# Patient Record
Sex: Male | Born: 1985 | Race: Black or African American | Hispanic: No | Marital: Single | Smoking: Current some day smoker
Health system: Southern US, Community
[De-identification: ages and names within clinical notes are randomized; demographics above are authoritative.]

## PROBLEM LIST (undated history)

## (undated) DIAGNOSIS — Z973 Presence of spectacles and contact lenses: Secondary | ICD-10-CM

## (undated) DIAGNOSIS — N189 Chronic kidney disease, unspecified: Secondary | ICD-10-CM

## (undated) DIAGNOSIS — J189 Pneumonia, unspecified organism: Secondary | ICD-10-CM

## (undated) DIAGNOSIS — F32A Depression, unspecified: Secondary | ICD-10-CM

## (undated) DIAGNOSIS — F419 Anxiety disorder, unspecified: Secondary | ICD-10-CM

## (undated) DIAGNOSIS — J449 Chronic obstructive pulmonary disease, unspecified: Secondary | ICD-10-CM

## (undated) DIAGNOSIS — S069XAA Unspecified intracranial injury with loss of consciousness status unknown, initial encounter: Secondary | ICD-10-CM

## (undated) DIAGNOSIS — I10 Essential (primary) hypertension: Secondary | ICD-10-CM

## (undated) DIAGNOSIS — G4730 Sleep apnea, unspecified: Secondary | ICD-10-CM

## (undated) DIAGNOSIS — S069X9A Unspecified intracranial injury with loss of consciousness of unspecified duration, initial encounter: Secondary | ICD-10-CM

## (undated) DIAGNOSIS — R519 Headache, unspecified: Secondary | ICD-10-CM

## (undated) DIAGNOSIS — F329 Major depressive disorder, single episode, unspecified: Secondary | ICD-10-CM

## (undated) HISTORY — PX: MYRINGOTOMY: SUR874

---
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
## 2005-02-11 ENCOUNTER — Emergency Department (HOSPITAL_COMMUNITY): Admission: EM | Admit: 2005-02-11 | Discharge: 2005-02-12 | Payer: Self-pay | Admitting: Emergency Medicine

## 2005-02-12 ENCOUNTER — Emergency Department (HOSPITAL_COMMUNITY): Admission: EM | Admit: 2005-02-12 | Discharge: 2005-02-12 | Payer: Self-pay | Admitting: *Deleted
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
## 2005-12-22 ENCOUNTER — Emergency Department (HOSPITAL_COMMUNITY): Admission: EM | Admit: 2005-12-22 | Discharge: 2005-12-22 | Payer: Self-pay | Admitting: Emergency Medicine

## 2006-11-16 ENCOUNTER — Emergency Department (HOSPITAL_COMMUNITY): Admission: EM | Admit: 2006-11-16 | Discharge: 2006-11-16 | Payer: Self-pay | Admitting: Emergency Medicine

## 2006-11-17 ENCOUNTER — Emergency Department (HOSPITAL_COMMUNITY): Admission: EM | Admit: 2006-11-17 | Discharge: 2006-11-17 | Payer: Self-pay | Admitting: Emergency Medicine

## 2008-07-29 ENCOUNTER — Inpatient Hospital Stay (HOSPITAL_COMMUNITY): Admission: AC | Admit: 2008-07-29 | Discharge: 2008-08-09 | Payer: Self-pay
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
## 2008-08-03 ENCOUNTER — Ambulatory Visit: Payer: Self-pay | Admitting: Physical Medicine & Rehabilitation
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
## 2008-08-09 ENCOUNTER — Ambulatory Visit: Payer: Self-pay | Admitting: Physical Medicine & Rehabilitation

## 2008-08-09 ENCOUNTER — Inpatient Hospital Stay (HOSPITAL_COMMUNITY)
Admission: RE | Admit: 2008-08-09 | Discharge: 2008-08-18 | Payer: Self-pay | Admitting: Physical Medicine & Rehabilitation
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
## 2008-08-21 ENCOUNTER — Encounter
Admission: RE | Admit: 2008-08-21 | Discharge: 2008-10-23 | Payer: Self-pay | Admitting: Physical Medicine & Rehabilitation

## 2008-08-30 ENCOUNTER — Ambulatory Visit: Payer: Self-pay | Admitting: Family Medicine

## 2008-09-18 ENCOUNTER — Encounter
Admission: RE | Admit: 2008-09-18 | Discharge: 2008-12-17 | Payer: Self-pay | Admitting: Physical Medicine & Rehabilitation

## 2008-09-19 ENCOUNTER — Ambulatory Visit: Payer: Self-pay | Admitting: Physical Medicine & Rehabilitation

## 2008-11-14 ENCOUNTER — Ambulatory Visit: Payer: Self-pay | Admitting: Physical Medicine & Rehabilitation

## 2009-05-08 ENCOUNTER — Emergency Department (HOSPITAL_COMMUNITY): Admission: EM | Admit: 2009-05-08 | Discharge: 2009-05-08 | Payer: Self-pay | Admitting: Emergency Medicine

## 2009-12-10 ENCOUNTER — Emergency Department (HOSPITAL_COMMUNITY): Admission: EM | Admit: 2009-12-10 | Discharge: 2009-12-11 | Payer: Self-pay | Admitting: Emergency Medicine

## 2010-01-19 ENCOUNTER — Emergency Department (HOSPITAL_COMMUNITY): Admission: EM | Admit: 2010-01-19 | Discharge: 2010-01-19 | Payer: Self-pay | Admitting: Emergency Medicine
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
## 2010-01-25 ENCOUNTER — Emergency Department (HOSPITAL_COMMUNITY): Admission: EM | Admit: 2010-01-25 | Discharge: 2010-01-25 | Payer: Self-pay | Admitting: Emergency Medicine
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
## 2010-07-05 ENCOUNTER — Emergency Department (HOSPITAL_COMMUNITY): Admission: EM | Admit: 2010-07-05 | Discharge: 2010-07-06 | Payer: Self-pay | Admitting: Emergency Medicine
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
## 2010-11-06 ENCOUNTER — Emergency Department (HOSPITAL_COMMUNITY): Admission: EM | Admit: 2010-11-06 | Discharge: 2010-11-06 | Payer: Self-pay | Admitting: Emergency Medicine

## 2010-12-16 ENCOUNTER — Emergency Department (HOSPITAL_COMMUNITY)
Admission: EM | Admit: 2010-12-16 | Discharge: 2010-12-16 | Payer: Self-pay | Source: Home / Self Care | Admitting: Emergency Medicine
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
## 2010-12-24 ENCOUNTER — Emergency Department (HOSPITAL_COMMUNITY)
Admission: EM | Admit: 2010-12-24 | Discharge: 2010-12-24 | Payer: Self-pay | Source: Home / Self Care | Admitting: Emergency Medicine

## 2011-03-15 LAB — CBC
MCH: 31.7 pg (ref 26.0–34.0)
MCHC: 35 g/dL (ref 30.0–36.0)
MCV: 90.6 fL (ref 78.0–100.0)
Platelets: 172 10*3/uL (ref 150–400)
RDW: 11.6 % (ref 11.5–15.5)
WBC: 5.7 10*3/uL (ref 4.0–10.5)

## 2011-03-15 LAB — DIFFERENTIAL
Basophils Absolute: 0.1 10*3/uL (ref 0.0–0.1)
Basophils Relative: 1 % (ref 0–1)
Eosinophils Absolute: 0.1 10*3/uL (ref 0.0–0.7)
Eosinophils Relative: 2 % (ref 0–5)
Monocytes Absolute: 0.3 10*3/uL (ref 0.1–1.0)

## 2011-03-15 LAB — BASIC METABOLIC PANEL
BUN: 8 mg/dL (ref 6–23)
CO2: 28 mEq/L (ref 19–32)
Chloride: 103 mEq/L (ref 96–112)
Creatinine, Ser: 1.17 mg/dL (ref 0.4–1.5)
Glucose, Bld: 98 mg/dL (ref 70–99)

## 2011-03-16 LAB — URINALYSIS, ROUTINE W REFLEX MICROSCOPIC
Bilirubin Urine: NEGATIVE
Glucose, UA: NEGATIVE mg/dL
Glucose, UA: NEGATIVE mg/dL
Hgb urine dipstick: NEGATIVE
Hgb urine dipstick: NEGATIVE
Ketones, ur: NEGATIVE mg/dL
Protein, ur: NEGATIVE mg/dL
Protein, ur: NEGATIVE mg/dL
Urobilinogen, UA: 1 mg/dL (ref 0.0–1.0)
pH: 6.5 (ref 5.0–8.0)

## 2011-03-16 LAB — GC/CHLAMYDIA PROBE AMP, GENITAL: GC Probe Amp, Genital: NEGATIVE

## 2011-04-07 LAB — RAPID STREP SCREEN (MED CTR MEBANE ONLY): Streptococcus, Group A Screen (Direct): NEGATIVE
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
## 2011-05-12 NOTE — Assessment & Plan Note (Signed)
Douglas [name] is back regarding his chronic brain injury.  He has been home with  his parents and doing quite well.  He is independent.  He goes out with  his friends and socializes.  He is interested in getting back to work.  His eyes still have problem, but he notes some improvement there.  He  has occasional shoulder pain, but this has dramatically improved.  He  does chores.  He is responsible according to his parents.  He calls them  and let them know where he is.  He has not been involved in any  questionable activity either.  He sleeps fairly well, but still on a  late night schedule.  He had been working third shift as a Doctor, general practice prior  to the injury.   REVIEW OF SYSTEMS:  Notable for the above.  Full review is in the  written health and history section of the chart.   SOCIAL HISTORY:  Noted above.  Parents are with him today.   PHYSICAL EXAMINATION:  VITAL SIGNS:  Blood pressure is 142/64, pulse 63,  respiratory rate 18, and he is sating 96% on room air.  GENERAL:  The patient is pleasant, alert and oriented x3.  His cognition  is good.  He has good insight awareness with tension.  Weight is stable.  He has excellent dynamic and standing balance.  He does have ptosis on  the right eye and difficulty with medial and superior deviation of the  eye.  He has good visual acuity otherwise.  Right pupil remains dilated.  Decreased pinprick and light touch also, still slightly on the right  side.  HEART:  Regular.  CHEST:  Clear.  ABDOMEN:  Soft and nontender.   ASSESSMENT:  1. Traumatic brain injury/multi-trauma.  2. Cranial nerve III injury.  3. History of asthma.   PLAN:  1. The patient is off all medications including his ramipril.  Blood      pressure is good today, so I recommended regular follow up of his      blood pressure due to his family history, but no medication at this      point.  2. Allowed the patient to return to work part-time.  He can work up to      20 hours a  week.  3. We will make referral to neuro-ophthalmology regarding his right      eye as his Medicaid approval allows.  4. I will see him back on an as needed basis.      Meredith Staggers, M.D.  Electronically Signed     ZTS/MedQ  D:  11/14/2008 10:24:59  T:  11/14/2008 [id number]  Job #:  [id number]

## 2011-05-12 NOTE — Assessment & Plan Note (Signed)
Douglas [name] is here in followup today September 19, 2008, for his traumatic  brain injury.  He has been at home with his family who had been  providing 24-hour supervision.  He is receiving outpatient therapy.  He  is doing quite well.  He denies pain currently.  His mood has been  excellent.  He has some problems with attention and short-term memory  for more trivial things.  He has had some improvement in his eye opening  and movement noting better vision to the left over the last few weeks.  He is still receiving PT, OT, and speech at [facility].  He ultimately will like to return to work but note he is  limited due to his neurological changes.   The patient reports numbness on the right side of the face, hand and  leg.  Mom notes occasional problems with temperature determination and  has been in a risk for burning himself a few times especially early  after discharge.   REVIEW OF SYSTEMS:  Notable for constipation occasionally, has had some  weakness.  He has a history of sleep apnea but seems to be doing right  there.  Breathing has been stable despite his chronic asthma.  Full  review is in the written health and history section in the chart.   SOCIAL HISTORY:  The patient is living with family as noted above.  He  was working as a Doctor, general practice prior to injury.   PHYSICAL EXAMINATION:  VITAL SIGNS:  Blood pressure 153/91, pulse is 51,  respiratory rate 18, and he is saturating 98% on room air.  GENERAL:  The patient is a pleasant, alert, and oriented x3.  He is able  to remember 3 words after 5 minutes.  He is able to follow simple one-  step commands.  Attention was fairly good.  Able to perform mathematic  equations in his head without significant delay.  Affect was  appropriate.  Weight seemed to be stable.  The patient ambulates  normally without any difficulties until we tried heel to toe gait which  caused some loss of balance to the right.  Face is notable for  ongoing  ptosis to the right eye with inability to medially move the eye.  He  also has dilation of the right pupil.  Face has some decrease in  pinprick and light touch as though the arm and leg today.  Strength is  showing generally 5/5 in the right arm and leg today.  HEART:  Regular.  CHEST:  Clear.  ABDOMEN:  Soft and nontender.   ASSESSMENT:  1. Traumatic brain injury including right femoral contusion.  2. Cranial nerve III injury.  3. History of asthma.   PLAN:  1. I would stop Tegretol as mood seems to be well controlled.  He is      only taking 100 mg b.i.d. currently.  2. Continue Ritalin 10 mg twice a day for attention and focus.  This      seems to benefit him.  3. We will hold off on neurophthalmology evaluation at this point      until his Medicaid comes through.  I don't think there is much to      do acutely regarding his vision at this time as it is due to his      traumatic brain injury and still stands to improve over the next      several weeks.  4. Outpatient therapy is to  work on higher level cognitive and      attention tasks as well as balance.  I do think that Miquan can be      alone by himself during the day although he is still living with      family.  We will start to leave      him alone for a few hours and observe for safety issues.  5. I will see him back in about 2 months' time.      Meredith Staggers, M.D.  Electronically Signed     ZTS/MedQ  D:  09/19/2008 11:51:00  T:  09/20/2008 00:39:34  Job #:  [id number]
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
## 2011-05-15 NOTE — Discharge Summary (Signed)
[place]  Patient:    Douglas [name], Douglas [name] Visit Number: [id number] MRN: [id number]          Service Type: PED Location: PEDS [phone] Attending Physician:  Baird Lyons Driver Dictated by:   Kasandra Knudsen. Annamaria Boots, M.D. Admit Date:  11/08/2001 Discharge Date: 11/11/2001   CC:         Bobby Rumpf D. Alroy Dust, M.D.  Parks Ranger, M.D  Sharon Mt, M.D., ENT   Discharge Summary  FINAL DIAGNOSES: 1. Asthmatic bronchitis with acute exacerbation. 2. Acute and chronic sinusitis. 3. Allergic rhinitis. 4. History of past noncompliance. 5. Depression. 6. Attention deficit disorder. 7. Hypertension, essential. 8. Obstructive sleep apnea.  BRIEF HISTORY:  This is a 25 year old African American male with chronic rhinosinusitis and asthmatic bronchitis, hypertension, and obstructive sleep apnea as well as a history of behavioral problems including attention deficit disorder, depression and medication noncompliance with missed school.  He had recently been fitted with CPAP at 12 cm of water pressure for obstructive sleep apnea but was not using it because of nasal congestion and he had presented the office markedly congested in the head and chest, not improving with initiation of Augmentin, prednisone taper and nebulizer treatments.  He came in through the emergency room complaining of wheezing.  He was noted to have marked rhonchi and was admitted.  Family said that outpatient medicines tended to be scattered randomly around the room, he would take only a few from each new prescription and they were sure that was why he was not responding to prescribed treatments.  He had been previously hospitalized at [facility] in September but had not followed up.  PHYSICAL EXAMINATION:  There was coarse rhonchi without accessory muscle use, retraction or stridor.  See dictated history and physical for details.  HOSPITAL COURSE:  He was begun on  Augmentin, intravenous Solu-Medrol, and nebulized bronchodilators.  His outpatient antidepressant and hypertensive therapy was continued.  As he improved, he was converted to Advair Diskus and oral prednisone.  His mother stayed with him during this admission and they were met by case management for social service discussion and they also had nutrition counseling for weight control.  He was discharged with condition much improved, no wheeze, no apparent nasal congestion and no cough, demonstrating response to therapy.  LABORATORY:  Pulmonary function tests done November 09, 2001, showed normal spirometry with forced vital capacity 4.08 liters (90%), FEV1 3.70 liters 96% of predicted and little response to bronchodilator.  Measured lung volumes were within normal without hyperinflation or air trapping.  Sputum culture showed mixed flora on Gram stain and reported normal oropharyngeal flora.  Oxygen saturations were recorded 95% on room air. Chemistry and CBC are not included in this chart.  DISCHARGE PLANS:  Diet:  Low fat, low sugar for weight loss.  CPAP 12 cm of water pressure all night every night.  Activity as tolerated.  DISCHARGE MEDICATIONS:  1. Amoxicillin 250 mg t.i.d. x 7 days to finish supply he has at home.  2. Prednisone using 5 mg tablets to taper over eight days from 40 mg daily     until finished.  3. Advair Diskus 250/50 one b.i.d.  4. Home nebulizer with Xopenex 0.63% t.i.d. p.r.n. or use Maxair Autohaler     two puffs q.i.d. p.r.n.  5. Guaifenesin LA 1200 mg b.i.d.  6. Rhinocort Aqua one spray each nostril q.d.  7. Ear drops (neomycin/polymyxin/hydrocortisone) as per Sharon Mt, M.D.  8. Singulair  10 mg q.d.  9. Effexor 75 mg XR two q.d. 10. Prevacid 30 mg q.d.  11. Norvasc 5 mg q.d.  FOLLOW-UP:  Office follow-up with me in three to four weeks.  Office follow-up with Parks Ranger, M.D, Argentina Donovan. Alroy Dust, M.D., and Dr. Prescott Parma as directed. Dictated by:    Kasandra Knudsen. Annamaria Boots, M.D. Attending Physician:  Baird Lyons Driver DD:  [id number] TD:  12/01/01 Job: [id number]
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
## 2011-05-15 NOTE — H&P (Signed)
[place] [employer]  Patient:    Douglas [name], Douglas [name] Visit Number: [id number] MRN: [id number]          Service Type: PED Location: PEDS [phone] Attending Physician:  Baird Lyons Driver Dictated by:   Kasandra Knudsen. Annamaria Boots, M.D. Admit Date:  11/08/2001   CC:         Argentina Donovan. Alroy Dust, M.D.  Parks Ranger, M.D   History and Physical  PROBLEMS: 1. 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 2. Medication noncompliance. 3. Hypertension. 4. Obstructive sleep apnea with excessive daytime somnolence.  HISTORY OF PRESENT ILLNESS:  A 25 year old African-American male with a long history of rhinosinusitis and asthmatic bronchitis, who has been well worked up by Parks Ranger, M.D, for allergy and respiratory problems.  He was sent to me earlier this year for evaluation and management of obstruction sleep apnea marked by daytime somnolence, irregular sleep schedule, snoring, and witnessed apnea.  He was fitted with CPAP at 12 cm of water pressure, which was effective, but when he came to see me in the office yesterday, he reported he had not been using it because of nasal congestion.  When seen in the office, he had characteristic marked nasal congestion and loosely congested rattling cough.  So that he could improve enough to tolerate CPAP, I gave an albuterol nebulizer treatment, Depo-Medrol 80 mg IM, Augmentin 875 mg b.i.d., and prednisone to taper from 40 mg.  This morning after being up at home, he says that his throat began closing off, but without sore throat, hives, angioedema, or itching.  He says that he panicked and "that makes me wheeze." He showed little improvement with albuterol nebulizer treatment in the emergency room and I was called by the emergency room for admission.  The stepfather had told me yesterday in the office that this patient has a long history of noncompliance with medications, will start a new bottle,  and then will scatter the pills loosely on the floor on his room, seems unable or unwilling to clean his room, to take medications on schedule, and to follow medical direction and "thats why he doesnt get better."  The patient has basically admitted the same to me.  REVIEW OF SYSTEMS:  No itching or throat tightness now.  Pharyngeal nasal congestion with no headache and no sore throat.  Often sleepy.  He will fall asleep late in the evening and then will sleep late in the morning, missing a lot of school.  He does snore.  Daily cough, rattle, and sputum, which is usually white or yellow.  Breathing does not change a lot from one day to the next, but is worse in humid weather and around respiratory irritants. Occasional reflux with no dysphagia or choking.  No palpitations, pleuritic or anginal pattern chest pain, water brash, or blood.  No nausea or vomiting. Occasional constipation.  He describes recently passing some medications per rectum.  No calf pain or edema.  PAST MEDICAL HISTORY:  Previous CT scan of sinuses showed mucosal thickening. He has had a detailed work-up for allergy and that information will be brought over from the office chart, but he is not on allergy vaccine.  Multiple emergency room and hospital visits for similar complaints.  His mother says, "He just gets patched up and bounces right back."  The last hospitalization for respiratory problems was a [place] in March.  He was hospitalized at the [facility] in September with uncertain  diagnosis.  He denies a history of heart, diabetes, renal, liver, pancreas, or thyroid problems, syncope, or seizures.  Treated for hypertension.  Surgery for adenoids and myringotomy tubes.  Recent outpatient treatment for otitis with eardrops.  SOCIAL HISTORY:  No tobacco.  No drugs.  Lives with mother, stepfather, and four sisters.  Nobody smokes.  Frequent missed school.  FAMILY HISTORY:  His  mother has has "chemical imbalance."  His fathers health status is unknown.  An uncle has asthma.  PHYSICAL EXAMINATION:  Temperature 98.7 degrees, pulse 119 and regular, BP 165/79, room air saturation 95%.  GENERAL APPEARANCE:  A pleasant, alert, oriented, very tall, moderately obese, young man.  Speech is clear.  SKIN:  No rash, ulcers, or bruises.  ADENOPATHY:  None.  HEENT:  Atraumatic.  Marked nasal congestion.  Watery, sniffing, and snorting. The tongue is not coated.  The mucus is white.  NECK:  No neck vein distention, stridor, or thyromegaly.  CHEST:  Heart sounds regular.  No murmur or gallop.  Normal S1 and S2.  The PMI is a little left for sternum.  LUNGS:  Coarse, loose rhonchi bilaterally.  Rattling and congested, but surprisingly a nonproductive cough.  Breathing is unlabored.  No retraction or accessory muscle use.  No stridor.  ABDOMEN:  Soft and nontender without hepatosplenomegaly.  Bowel sounds are present.  I feel no masses.  GENITALIA:  Not done at this time.  No complaint.  RECTAL:  Not done at this time.  No complaint.  EXTREMITIES:  No clubbing, cyanosis, or edema.  He jiggles his legs a lot, but no vesiculation.  Coordination appears normal.  CHEST X-RAY:  A film has been done, but I cannot find it in the department tonight.  IMPRESSION: 1. Acute exacerbation of chronic rhinosinusitis and asthma.  There is a    significant pattern of congested cough and nasal discharge.  Noncompliance    with therapy is suggested as the underlying problem.  To my knowledge, he    has not been found to have any congenital abnormality or any particularly    impressive environment exposure.  I do not get a family history suggesting    cystic fibrosis, immotile-cilia syndrome, or specific immune deficiency.    He may reflux a little, but it does not seem to be a large scale problem.     Most of this has been worked up and I will check for records. 2. The  behavioral issue has been previously addressed.  I will check with his    primary physician for available information, but his ability and    willingness to follow treatment plans will make a huge difference in our    success. 3. Hypertension.  This should respond to his outpatient therapy, but we may    need to add a diuretic, particularly why he is on steroids. 4. Obstructive sleep apnea.  This should respond to CPAP when we can get his    nose cleared up enough to tolerate it.  This and some counseling would help    a more regular sleep pattern, which should improve his ability to go to    school. Dictated by:   Kasandra Knudsen. Annamaria Boots, M.D. Attending Physician:  Baird Lyons Driver DD:  [id number] TD:  11/08/01 Job: [id number]

## 2011-09-25 LAB — BLOOD GAS, ARTERIAL
Bicarbonate: 27.2 — ABNORMAL HIGH
Bicarbonate: 28 — ABNORMAL HIGH
FIO2: 0.3
MECHVT: 650
O2 Saturation: 75.3
O2 Saturation: 97.9
PEEP: 5
PEEP: 5
Patient temperature: 101.7
Patient temperature: 98.6
RATE: 12
RATE: 12
pH, Arterial: 7.375
pH, Arterial: 7.379

## 2011-09-25 LAB — EXPECTORATED SPUTUM ASSESSMENT W GRAM STAIN, RFLX TO RESP C

## 2011-09-25 LAB — TYPE AND SCREEN: Antibody Screen: NEGATIVE

## 2011-09-25 LAB — POCT I-STAT, CHEM 8
HCT: 43
Hemoglobin: 14.6
Potassium: 2.5 — CL
Sodium: 143
TCO2: 21

## 2011-09-25 LAB — CBC
HCT: 31.6 — ABNORMAL LOW
HCT: 35.1 — ABNORMAL LOW
HCT: 35.9 — ABNORMAL LOW
HCT: 39
Hemoglobin: 10.5 — ABNORMAL LOW
Hemoglobin: 12.5 — ABNORMAL LOW
Hemoglobin: 12.9 — ABNORMAL LOW
Hemoglobin: 8.2 — ABNORMAL LOW
MCHC: 31.1
MCHC: 32.9
MCHC: 33
MCHC: 33.2
MCV: 90.3
MCV: 91.2
MCV: 91.5
MCV: 92
Platelets: 140 — ABNORMAL LOW
Platelets: 191
Platelets: 226
Platelets: 228
Platelets: 280
Platelets: 399
RBC: 2.74 — ABNORMAL LOW
RBC: 3.46 — ABNORMAL LOW
RBC: 4.09 — ABNORMAL LOW
RBC: 4.18 — ABNORMAL LOW
RBC: 4.21 — ABNORMAL LOW
RBC: 4.41
RDW: 12.3
RDW: 12.5
RDW: 12.6
RDW: 12.7
RDW: 12.7
RDW: 12.7
RDW: 12.9
RDW: 13.3
WBC: 11.5 — ABNORMAL HIGH
WBC: 6.5
WBC: 7.6
WBC: 7.8
WBC: 9.2

## 2011-09-25 LAB — URINALYSIS, ROUTINE W REFLEX MICROSCOPIC
Ketones, ur: NEGATIVE
Leukocytes, UA: NEGATIVE
Nitrite: NEGATIVE
Specific Gravity, Urine: 1.02
pH: 5.5

## 2011-09-25 LAB — COMPREHENSIVE METABOLIC PANEL
Albumin: 4
BUN: 12
Calcium: 9.8
Glucose, Bld: 99
Sodium: 140
Total Protein: 8

## 2011-09-25 LAB — URINE MICROSCOPIC-ADD ON

## 2011-09-25 LAB — BASIC METABOLIC PANEL
BUN: 10
BUN: 10
BUN: 3 — ABNORMAL LOW
BUN: 4 — ABNORMAL LOW
CO2: 25
Calcium: 8.3 — ABNORMAL LOW
Calcium: 8.4
Calcium: 9
Calcium: 9.5
Chloride: 105
Creatinine, Ser: 0.82
Creatinine, Ser: 0.84
Creatinine, Ser: 0.95
Creatinine, Ser: 1.03
GFR calc Af Amer: >60
GFR calc Af Amer: >60
GFR calc Af Amer: >60
GFR calc non Af Amer: >60
GFR calc non Af Amer: >60
GFR calc non Af Amer: >60
GFR calc non Af Amer: >60
GFR calc non Af Amer: >60
GFR calc non Af Amer: >60
Glucose, Bld: 104 — ABNORMAL HIGH
Glucose, Bld: 91
Glucose, Bld: 98
Glucose, Bld: 99
Potassium: 3.8
Potassium: 3.8
Potassium: 3.9
Potassium: 4.1
Sodium: 134 — ABNORMAL LOW
Sodium: 136
Sodium: 136

## 2011-09-25 LAB — RAPID URINE DRUG SCREEN, HOSP PERFORMED
Amphetamines: NOT DETECTED
Tetrahydrocannabinol: POSITIVE — AB

## 2011-09-25 LAB — DIFFERENTIAL
Basophils Absolute: 0.1
Eosinophils Absolute: 0.3
Eosinophils Absolute: 0.5
Eosinophils Relative: 4
Lymphocytes Relative: 21
Lymphocytes Relative: 40
Lymphs Abs: 2
Lymphs Abs: 2.4
Lymphs Abs: 5.2 — ABNORMAL HIGH
Monocytes Absolute: 1
Monocytes Absolute: 1.3 — ABNORMAL HIGH
Monocytes Relative: 11
Monocytes Relative: 9
Neutro Abs: 4.7
Neutro Abs: 6.3
Neutro Abs: 7.2
Neutrophils Relative %: 60
Neutrophils Relative %: 63
nRBC: 0

## 2011-09-25 LAB — URINE CULTURE: Colony Count: NO GROWTH

## 2011-09-25 LAB — ABO/RH: ABO/RH(D): A POS

## 2011-09-25 LAB — CULTURE, RESPIRATORY W GRAM STAIN

## 2012-03-15 ENCOUNTER — Encounter (HOSPITAL_BASED_OUTPATIENT_CLINIC_OR_DEPARTMENT_OTHER): Payer: Self-pay | Admitting: *Deleted

## 2012-03-15 ENCOUNTER — Emergency Department (HOSPITAL_BASED_OUTPATIENT_CLINIC_OR_DEPARTMENT_OTHER)
Admission: EM | Admit: 2012-03-15 | Discharge: 2012-03-15 | Disposition: A | Discharge status: Home / Self Care | Payer: BC Managed Care – PPO | Attending: Emergency Medicine | Admitting: Emergency Medicine

## 2012-03-15 DIAGNOSIS — K029 Dental caries, unspecified: Secondary | ICD-10-CM | POA: Insufficient documentation

## 2012-03-15 DIAGNOSIS — I10 Essential (primary) hypertension: Secondary | ICD-10-CM | POA: Insufficient documentation

## 2012-03-15 DIAGNOSIS — R062 Wheezing: Secondary | ICD-10-CM

## 2012-03-15 DIAGNOSIS — F17200 Nicotine dependence, unspecified, uncomplicated: Secondary | ICD-10-CM | POA: Insufficient documentation

## 2012-03-15 DIAGNOSIS — G4730 Sleep apnea, unspecified: Secondary | ICD-10-CM | POA: Insufficient documentation

## 2012-03-15 DIAGNOSIS — K089 Disorder of teeth and supporting structures, unspecified: Secondary | ICD-10-CM | POA: Insufficient documentation

## 2012-03-15 DIAGNOSIS — J449 Chronic obstructive pulmonary disease, unspecified: Secondary | ICD-10-CM | POA: Insufficient documentation

## 2012-03-15 DIAGNOSIS — K0889 Other specified disorders of teeth and supporting structures: Secondary | ICD-10-CM

## 2012-03-15 DIAGNOSIS — J4489 Other specified chronic obstructive pulmonary disease: Secondary | ICD-10-CM | POA: Insufficient documentation

## 2012-03-15 HISTORY — DX: Sleep apnea, unspecified: G47.30

## 2012-03-15 HISTORY — DX: Chronic obstructive pulmonary disease, unspecified: J44.9

## 2012-03-15 HISTORY — DX: Essential (primary) hypertension: I10

## 2012-03-15 MED ORDER — OXYCODONE-ACETAMINOPHEN 5-325 MG PO TABS
2.0000 | ORAL_TABLET | Freq: Once | ORAL | Status: AC
  Administered 2012-03-15: 2 via ORAL
  Filled 2012-03-15: qty 2

## 2012-03-15 MED ORDER — OXYCODONE-ACETAMINOPHEN 5-325 MG PO TABS: 1.0000 | ORAL_TABLET | Freq: Four times a day (QID) | ORAL | Status: AC | PRN

## 2012-03-15 MED ORDER — ALBUTEROL SULFATE HFA 108 (90 BASE) MCG/ACT IN AERS: 1.0000 | INHALATION_SPRAY | Freq: Four times a day (QID) | RESPIRATORY_TRACT | Status: DC | PRN

## 2012-03-15 MED ORDER — ALBUTEROL SULFATE HFA 108 (90 BASE) MCG/ACT IN AERS
2.0000 | INHALATION_SPRAY | Freq: Once | RESPIRATORY_TRACT | Status: AC
  Administered 2012-03-15: 2 via RESPIRATORY_TRACT
  Filled 2012-03-15: qty 6.7

## 2012-03-15 MED ORDER — PENICILLIN V POTASSIUM 500 MG PO TABS: 500.0000 mg | ORAL_TABLET | Freq: Four times a day (QID) | ORAL | Status: AC

## 2012-03-15 NOTE — Discharge Instructions (Signed)
Dental Caries  Tooth decay (dental caries, cavities) is the most common of all oral diseases. It occurs in all ages but is more common in children and young adults.  CAUSES  Bacteria in your mouth combine with foods (particularly sugars and starches) to produce plaque. Plaque is a substance that sticks to the hard surfaces of teeth. The bacteria in the plaque produce acids that attack the enamel of teeth. Repeated acid attacks dissolve the enamel and create holes in the teeth. Root surfaces of teeth may also get these holes.  Other contributing factors include:   Frequent snacking and drinking of cavity-producing foods and liquids.   Poor oral hygiene.   Dry mouth.   Substance abuse such as methamphetamine.   Broken or poor fitting dental restorations.   Eating disorders.   Gastroesophageal reflux disease (GERD).   Certain radiation treatments to the head and neck.  SYMPTOMS  At first, dental decay appears as white, chalky areas on the enamel. In this early stage, symptoms are seldom present. As the decay progresses, pits and holes may appear on the enamel surfaces. Progression of the decay will lead to softening of the hard layers of the tooth. At this point you may experience some pain or achy feeling after sweet, hot, or cold foods or drinks are consumed. If left untreated, the decay will reach the internal structures of the tooth and produce severe pain. Extensive dental treatment, such as root canal therapy, may be needed to save the tooth at this late stage of decay development.  DIAGNOSIS  Most cavities will be detected during regular check-ups. A thorough medical and dental history will be taken by the dentist. The dentist will use instruments to check the surfaces of your teeth for any breakdown or discoloration. Some dentists have special instruments, such as lasers, that detect tooth decay. Dental X-rays may also show some cavities that are not visible to the eye (such as between  the contact areas of the teeth). TREATMENT  Treatment involves removal of the tooth decay and replacement with a restorative material such as silver, gold, or composite (white) material. However, if the decay involves a large area of the tooth and there is little remaining healthy tooth structure, a cap (crown) will be fitted over the remaining structure. If the decay involves the center part of the tooth (pulp), root canal treatment will be needed before any type of dental restoration is placed. If the tooth is severely destroyed by the decay process, leaving the remaining tooth structures unrestorable, the tooth will need to be pulled (extracted). Some early tooth decay may be reversed by fluoride treatments and thorough brushing and flossing at home. PREVENTION   Eat healthy foods. Restrict the amount of sugary, starchy foods and liquids you consume. Avoid frequent snacking and drinking of unhealthy foods and liquids.   Sealants can help with prevention of cavities. Sealants are composite resins applied onto the biting surfaces of teeth at risk for decay. They smooth out the pits and grooves and prevent food from being trapped in them. This is done in early childhood before tooth decay has started.   Fluoride tablets may also be prescribed to children between 6 months and 10 years of age if your drinking water is not fluoridated. The fluoride absorbed by the tooth enamel makes teeth less susceptible to decay. Thorough daily cleaning with a toothbrush and dental floss is the best way to prevent cavities. Use of a fluoride toothpaste is highly recommended. Fluoride mouth rinses   may be used in specific cases.   Topical application of fluoride by your dentist is important in children.   Regular visits with a dentist for checkups and cleanings are also important.  SEEK IMMEDIATE DENTAL CARE IF:  You have a fever.   You develop redness and swelling of your face, jaw, or neck.   You develop swelling  around a tooth.   You are unable to open your mouth or cannot swallow.   You have severe pain uncontrolled by pain medicine.  Document Released: 09/05/2002 Document Revised: 12/03/2011 Document Reviewed: 05/21/2011 [facility] Patient Information 2012 [place], [place]. Dental Pain A tooth ache may be caused by cavities (tooth decay). Cavities expose the nerve of the tooth to air and hot or cold temperatures. It may come from an infection or abscess (also called a boil or furuncle) around your tooth. It is also often caused by dental caries (tooth decay). This causes the pain you are having. DIAGNOSIS  Your caregiver can diagnose this problem by exam. TREATMENT   If caused by an infection, it may be treated with medications which kill germs (antibiotics) and pain medications as prescribed by your caregiver. Take medications as directed.   Only take over-the-counter or prescription medicines for pain, discomfort, or fever as directed by your caregiver.   Whether the tooth ache today is caused by infection or dental disease, you should see your dentist as soon as possible for further care.  SEEK MEDICAL CARE IF: The exam and treatment you received today has been provided on an emergency basis only. This is not a substitute for complete medical or dental care. If your problem worsens or new problems (symptoms) appear, and you are unable to meet with your dentist, call or return to this location. SEEK IMMEDIATE MEDICAL CARE IF:   You have a fever.   You develop redness and swelling of your face, jaw, or neck.   You are unable to open your mouth.   You have severe pain uncontrolled by pain medicine.  MAKE SURE YOU:   Understand these instructions.   Will watch your condition.   Will get help right away if you are not doing well or get worse.  Document Released: 12/14/2005 Document Revised: 12/03/2011 Document Reviewed: 08/01/2008 [facility] Patient Information 2012 [place].  RESOURCE GUIDE  Dental Problems  Patients with Medicaid: [place] [facility] Phone:  [phone]                                                   Phone:  [phone]  If unable to pay or uninsured, contact:  [facility] or [facility]. to become qualified for the adult dental clinic.  Chronic Pain Problems Contact [facility]  [phone] Patients need to be referred by their primary care doctor.  Insufficient Money for Medicine Contact [employer]:  call "211" or [place] [phone].  No Primary  Care Doctor Call [place]  [phone] Other agencies that provide inexpensive medical care    [place]  [phone]    [facility]  [place]  [phone]    [facility]  [phone]    [facility]  [place]  [place]  [phone] [place]   [phone] (emergency services [phone])  Abuse/Neglect [place] [phone] [place] [phone] (After Hours)  Emergency [place] [phone]  [place] at the [place] [phone] [place] [phone]  MRSA Hotline #:   [phone]    [facility] Dept. [address]         [place] Phone:  [phone]                                  Phone:  [phone]                   Phone:   [place] Phone:  [place] [phone] (After Hours)  Bronchospasm A bronchospasm is when the tubes that carry air in and out of your lungs (bronchioles) become smaller. It is hard to breathe when this happens. A bronchospasm can be caused by:  Asthma.   Allergies.   Lung infection.  HOME CARE   Do not  smoke. Avoid places that have secondhand smoke.   Dust your house often. Have your air ducts cleaned once or twice a year.   Find out what allergies may cause your bronchospasms.   Use your inhaler properly if you have one. Know when to use it.   Eat healthy foods and drink plenty of water.   Only take medicine as told by your doctor.  GET HELP RIGHT AWAY IF:  You feel you cannot breathe or catch your breath.   You cannot stop coughing.   Your treatment is not helping you breathe better.  MAKE SURE YOU:   Understand these instructions.   Will watch your condition.   Will get help right away if you are not doing well or get worse.  Document Released: 10/11/2009 Document Revised: 12/03/2011 Document Reviewed: 10/11/2009 [facility] Patient Information 2012 [place].

## 2012-03-15 NOTE — ED Notes (Signed)
C/O pain in his mouth/teeth- upper & lower on both sides. Reports the pain is worse in the evenings and night/unable to eat because of the pain

## 2012-03-15 NOTE — ED Provider Notes (Signed)
History     CSN: [id number]  Arrival date & time 03/15/12  Curly Rim   First MD Initiated Contact with Patient 03/15/12 2008      Chief Complaint  Patient presents with  . Dental Pain    (Consider location/radiation/quality/duration/timing/severity/associated sxs/prior treatment) HPI  25yoM h/o multiple dental caries pw dental pain. Intermittent x few weeks, worsening yesterday. +Sunj fever +chills. BL upper and lower pain. No swelling of gums. Denies neck/throat swelling. Denies difficulty speaking. Took tylenol yesterday without relief. Denies cp/sob. Chronic cough at baseline.  ED Notes, ED Provider Notes from 03/15/12 0000 to 03/15/12 19:31:34       Claiborne Billings Fulton Reek, RN 03/15/2012 19:29      C/O pain in his mouth/teeth- upper & lower on both sides. Reports the pain is worse in the evenings and night/unable to eat because of the pain    Past Medical History  Diagnosis Date  . COPD (chronic obstructive pulmonary disease)   . Asthma   . Sleep apnea   . Hypertension     Past Surgical History  Procedure Date  . Myringotomy     No family history on file.  History  Substance Use Topics  . Smoking status: Current Everyday Smoker  . Smokeless tobacco: Not on file  . Alcohol Use: Yes      Review of Systems  All other systems reviewed and are negative.   except as noted HPI   Allergies  Review of patient's allergies indicates no known allergies.  Home Medications   Current Outpatient Rx  Name Route Sig Dispense Refill  . ALBUTEROL SULFATE HFA 108 (90 BASE) MCG/ACT IN AERS Inhalation Inhale 1-2 puffs into the lungs every 6 (six) hours as needed for wheezing. 1 Inhaler 10  . OXYCODONE-ACETAMINOPHEN 5-325 MG PO TABS Oral Take 1 tablet by mouth every 6 (six) hours as needed for pain. 20 tablet 0  . PENICILLIN V POTASSIUM 500 MG PO TABS Oral Take 1 tablet (500 mg total) by mouth 4 (four) times daily. 40 tablet 0    BP 127/86  Pulse 55  Temp(Src) 98.4 F (36.9 C)  (Oral)  Resp 19  SpO2 99%  Physical Exam  Nursing note and vitals reviewed. Constitutional: He is oriented to person, place, and time. He appears well-developed and well-nourished. No distress.  HENT:  Head: Atraumatic.  Mouth/Throat: Oropharynx is clear and moist.    Eyes: Conjunctivae are normal. Pupils are equal, round, and reactive to light.  Neck: Neck supple.  Cardiovascular: Normal rate, regular rhythm, normal heart sounds and intact distal pulses.  Exam reveals no gallop and no friction rub.   No murmur heard. Pulmonary/Chest: Effort normal. No respiratory distress. He has wheezes. He has no rales.       Diffuse exp wheeze  Abdominal: Soft. Bowel sounds are normal. There is no tenderness. There is no rebound and no guarding.  Musculoskeletal: Normal range of motion. He exhibits no edema and no tenderness.  Neurological: He is alert and oriented to person, place, and time.  Skin: Skin is warm and dry.  Psychiatric: He has a normal mood and affect.    ED Course  Procedures (including critical care time)  Labs Reviewed - No data to display No results found.   1. Pain, dental   2. Tooth decay   3. Wheezing     MDM  Multiple areas of dental decay. Pain control, PCN VK, dental f/u.  Pt also with wheezing but baseline cough, no cp or  sob. Albuterol ordered here. Refills for home.  Precautions for return.        Blair Heys, MD 03/15/12 2053

## 2014-07-04 ENCOUNTER — Encounter (HOSPITAL_BASED_OUTPATIENT_CLINIC_OR_DEPARTMENT_OTHER): Payer: Self-pay | Admitting: Emergency Medicine

## 2014-07-04 ENCOUNTER — Emergency Department (HOSPITAL_BASED_OUTPATIENT_CLINIC_OR_DEPARTMENT_OTHER): Payer: BC Managed Care – PPO

## 2014-07-04 ENCOUNTER — Emergency Department (HOSPITAL_BASED_OUTPATIENT_CLINIC_OR_DEPARTMENT_OTHER)
Admission: EM | Admit: 2014-07-04 | Discharge: 2014-07-04 | Disposition: A | Discharge status: Home / Self Care | Payer: BC Managed Care – PPO | Attending: Emergency Medicine | Admitting: Emergency Medicine

## 2014-07-04 DIAGNOSIS — J189 Pneumonia, unspecified organism: Secondary | ICD-10-CM | POA: Insufficient documentation

## 2014-07-04 DIAGNOSIS — J449 Chronic obstructive pulmonary disease, unspecified: Secondary | ICD-10-CM | POA: Insufficient documentation

## 2014-07-04 DIAGNOSIS — Z87828 Personal history of other (healed) physical injury and trauma: Secondary | ICD-10-CM | POA: Insufficient documentation

## 2014-07-04 DIAGNOSIS — R5381 Other malaise: Secondary | ICD-10-CM | POA: Insufficient documentation

## 2014-07-04 DIAGNOSIS — R059 Cough, unspecified: Secondary | ICD-10-CM

## 2014-07-04 DIAGNOSIS — J4489 Other specified chronic obstructive pulmonary disease: Secondary | ICD-10-CM | POA: Insufficient documentation

## 2014-07-04 DIAGNOSIS — R5383 Other fatigue: Secondary | ICD-10-CM

## 2014-07-04 DIAGNOSIS — R509 Fever, unspecified: Secondary | ICD-10-CM | POA: Insufficient documentation

## 2014-07-04 DIAGNOSIS — R05 Cough: Secondary | ICD-10-CM

## 2014-07-04 DIAGNOSIS — I10 Essential (primary) hypertension: Secondary | ICD-10-CM | POA: Insufficient documentation

## 2014-07-04 DIAGNOSIS — J3489 Other specified disorders of nose and nasal sinuses: Secondary | ICD-10-CM | POA: Insufficient documentation

## 2014-07-04 DIAGNOSIS — F17200 Nicotine dependence, unspecified, uncomplicated: Secondary | ICD-10-CM | POA: Insufficient documentation

## 2014-07-04 HISTORY — DX: Unspecified intracranial injury with loss of consciousness of unspecified duration, initial encounter: S06.9X9A

## 2014-07-04 HISTORY — DX: Unspecified intracranial injury with loss of consciousness status unknown, initial encounter: S06.9XAA

## 2014-07-04 IMAGING — CR DG CHEST 2V
2 series · 2 of 2 positions shown · non-contrast
Comparison: [DATE]

CLINICAL DATA: Cough with pain

EXAM:
CHEST  2 VIEW

[w chest pa]
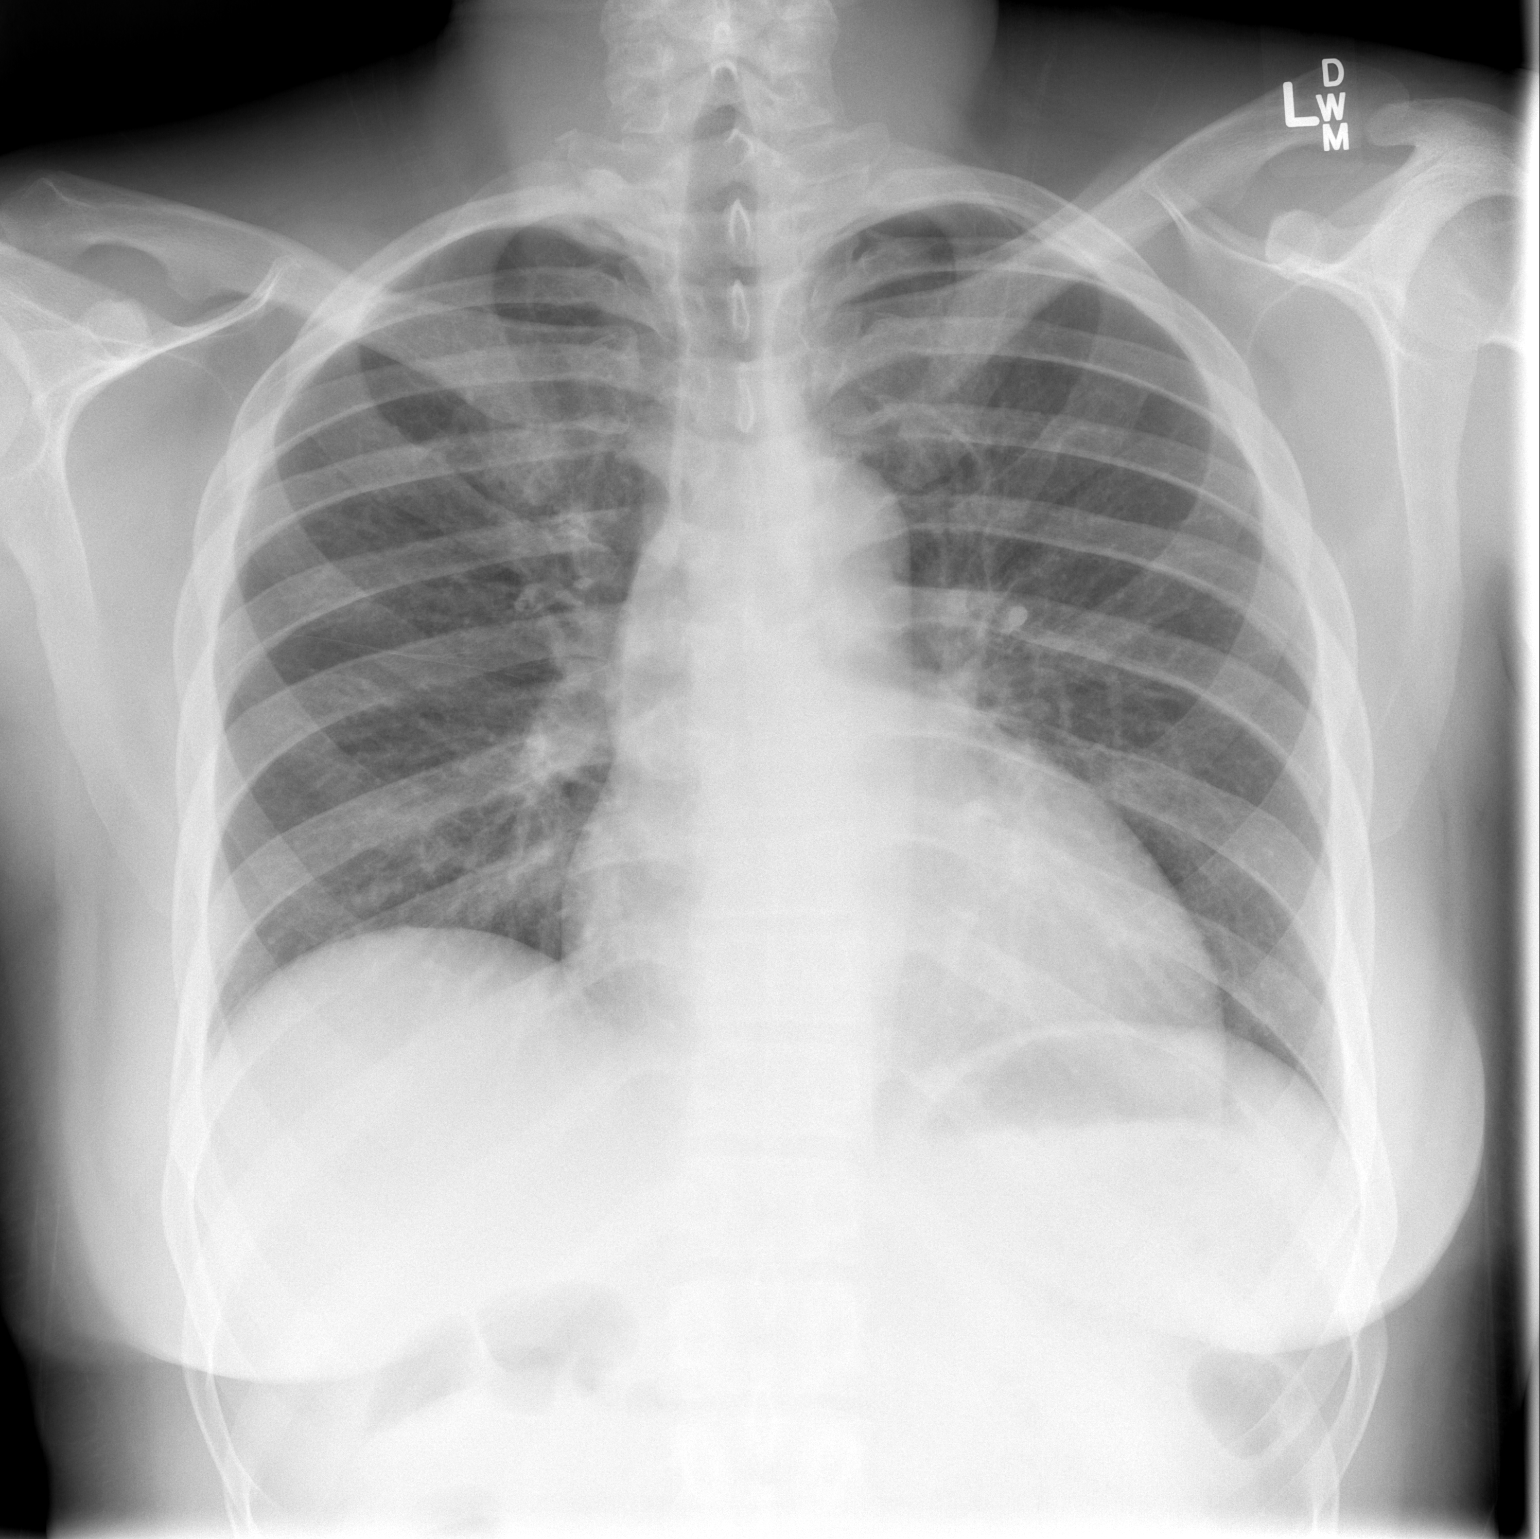

[w chest lat]
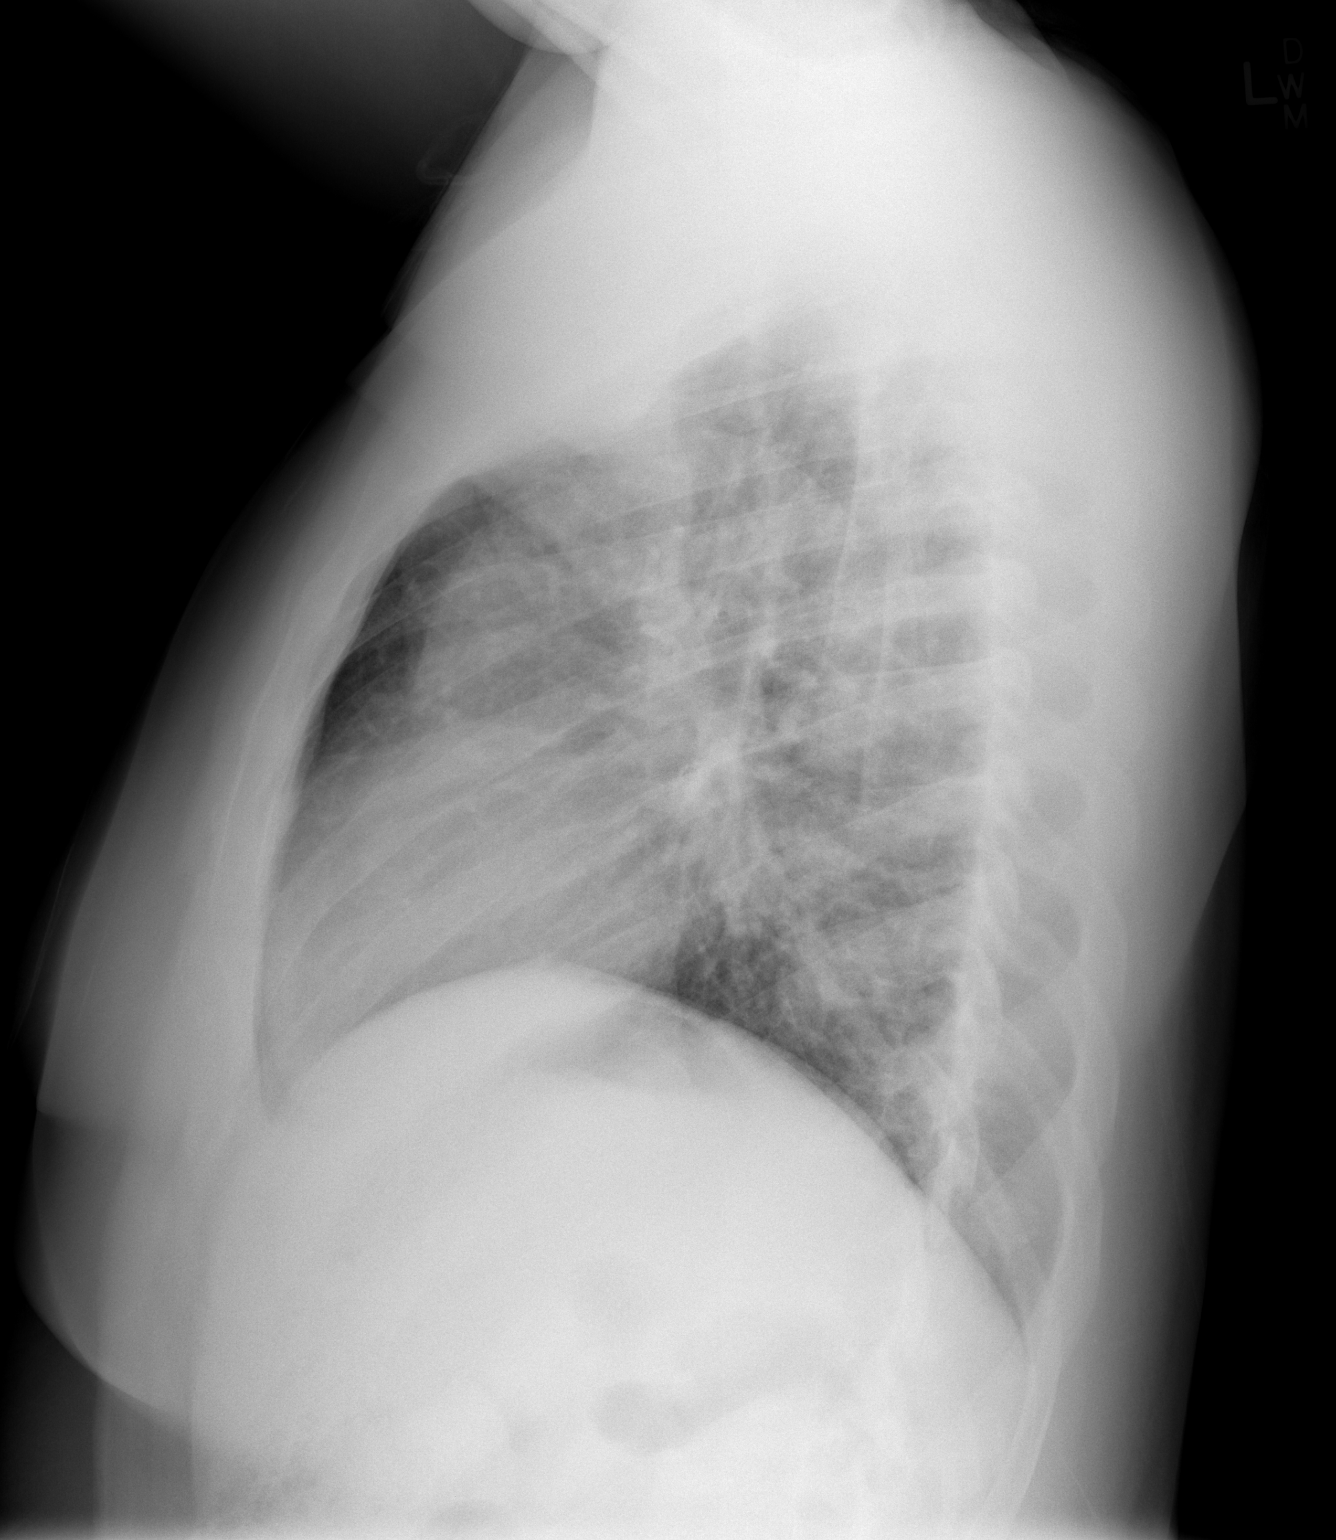

[2 of 2 positions shown; findings below may reference images not displayed]

FINDINGS: There is no edema or consolidation. The heart size and pulmonary
vascularity are normal. No adenopathy. No pneumothorax. No bone
lesions.
IMPRESSION: No abnormality noted.

## 2014-07-04 MED ORDER — ALBUTEROL SULFATE HFA 108 (90 BASE) MCG/ACT IN AERS
2.0000 | INHALATION_SPRAY | RESPIRATORY_TRACT | Status: DC | PRN
  Administered 2014-07-04: 2 via RESPIRATORY_TRACT
  Filled 2014-07-04: qty 6.7

## 2014-07-04 MED ORDER — AZITHROMYCIN 250 MG PO TABS: 250.0000 mg | ORAL_TABLET | Freq: Every day | ORAL | Status: DC

## 2014-07-04 NOTE — ED Provider Notes (Signed)
CSN: [id number]     Arrival date & time 07/04/14  1950 History   First MD Initiated Contact with Patient 07/04/14 2022     Chief Complaint  Patient presents with  . Cough     (Consider location/radiation/quality/duration/timing/severity/associated sxs/prior Treatment) HPI Comments: Pt states that he has had productive cough, with pain for the last week. Fevers intermittently. No vomiting or diarrhea. Generalized myalgias. Pt states that he has a history of pneumonia and this feels similar. Pt is a smoker  The history is provided by the patient. No language interpreter was used.    Past Medical History  Diagnosis Date  . COPD (chronic obstructive pulmonary disease)   . Asthma   . Sleep apnea   . Hypertension   . Brain injury    Past Surgical History  Procedure Laterality Date  . Myringotomy     No family history on file. History  Substance Use Topics  . Smoking status: Current Every Day Smoker    Types: Cigarettes  . Smokeless tobacco: Not on file  . Alcohol Use: Yes    Review of Systems  Constitutional: Positive for fever and fatigue.  HENT: Positive for congestion.   Respiratory: Positive for cough and chest tightness.   Cardiovascular: Positive for chest pain.      Allergies  Review of patient's allergies indicates no known allergies.  Home Medications   Prior to Admission medications   Not on File   BP 160/98  Pulse 86  Temp(Src) 99.6 F (37.6 C) (Oral)  Resp 18  Ht 6\' 5"  (1.956 m)  Wt 257 lb (116.574 kg)  BMI 30.47 kg/m2  SpO2 96% Physical Exam  Nursing note and vitals reviewed. Constitutional: He is oriented to person, place, and time. He appears well-developed and well-nourished.  HENT:  Head: Normocephalic and atraumatic.  Right Ear: External ear normal.  Left Ear: External ear normal.  Eyes: Conjunctivae and EOM are normal.  Neck: Normal range of motion. Neck supple.  Cardiovascular: Normal rate and regular rhythm.   Pulmonary/Chest: No  respiratory distress. He has rales.  Musculoskeletal: Normal range of motion.  Neurological: He is alert and oriented to person, place, and time.  Skin: Skin is warm and dry.  Psychiatric: He has a normal mood and affect.    ED Course  Procedures (including critical care time) Labs Review Labs Reviewed - No data to display  Imaging Review Dg Chest 2 View  07/04/2014   CLINICAL DATA:  Cough with pain  EXAM: CHEST  2 VIEW  COMPARISON:  July 05, 2010  FINDINGS: There is no edema or consolidation. The heart size and pulmonary vascularity are normal. No adenopathy. No pneumothorax. No bone lesions.  IMPRESSION: No abnormality noted.   Electronically Signed   By: Lowella Grip M.D.   On: 07/04/2014 20:26     EKG Interpretation None      MDM   Final diagnoses:  Cough    X-ray is negative. Will treat with antibiotics as clinically pt has pneumonia:pt not in any distress. Don't think steroids are needed at this time    Glendell Docker, NP 07/04/14 2113

## 2014-07-04 NOTE — Discharge Instructions (Signed)
Cough, Adult ° A cough is a reflex. It helps you clear your throat and airways. A cough can help heal your body. A cough can last 2 or 3 weeks (acute) or may last more than 8 weeks (chronic). Some common causes of a cough can include an infection, allergy, or a cold. °HOME CARE °· Only take medicine as told by your doctor. °· If given, take your medicines (antibiotics) as told. Finish them even if you start to feel better. °· Use a cold steam vaporizer or humidier in your home. This can help loosen thick spit (secretions). °· Sleep so you are almost sitting up (semi-upright). Use pillows to do this. This helps reduce coughing. °· Rest as needed. °· Stop smoking if you smoke. °GET HELP RIGHT AWAY IF: °· You have yellowish-white fluid (pus) in your thick spit. °· Your cough gets worse. °· Your medicine does not reduce coughing, and you are losing sleep. °· You cough up blood. °· You have trouble breathing. °· Your pain gets worse and medicine does not help. °· You have a fever. °MAKE SURE YOU:  °· Understand these instructions. °· Will watch your condition. °· Will get help right away if you are not doing well or get worse. °Document Released: 08/27/2011 Document Revised: 03/07/2012 Document Reviewed: 08/27/2011 °Exit[employer]® Patient Information ©2015 [employer]. This information is not intended to replace advice given to you by your health care provider. Make sure you discuss any questions you have with your health care provider. ° °

## 2014-07-04 NOTE — ED Notes (Signed)
NP at bedside.

## 2014-07-04 NOTE — ED Notes (Signed)
C/o prod cough, CP with deep breaths x 1 week

## 2014-07-05 NOTE — ED Provider Notes (Signed)
Medical screening examination/treatment/procedure(s) were performed by non-physician practitioner and as supervising physician I was immediately available for consultation/collaboration.   EKG Interpretation None        Houston Siren III, MD 07/05/14 [phone]

## 2020-03-31 ENCOUNTER — Inpatient Hospital Stay (HOSPITAL_COMMUNITY)
Admission: EM | Admit: 2020-03-31 | Discharge: 2020-04-05 | DRG: 304 | Discharge status: Against Medical Advice | Payer: Medicaid Other | Attending: Family Medicine | Admitting: Family Medicine

## 2020-03-31 ENCOUNTER — Emergency Department (HOSPITAL_COMMUNITY): Payer: Medicaid Other

## 2020-03-31 ENCOUNTER — Other Ambulatory Visit: Payer: Self-pay

## 2020-03-31 ENCOUNTER — Encounter (HOSPITAL_COMMUNITY): Payer: Self-pay

## 2020-03-31 ENCOUNTER — Ambulatory Visit (HOSPITAL_COMMUNITY)
Admission: EM | Admit: 2020-03-31 | Discharge: 2020-03-31 | Disposition: A | Discharge status: Home / Self Care | Payer: Self-pay

## 2020-03-31 DIAGNOSIS — R319 Hematuria, unspecified: Secondary | ICD-10-CM | POA: Diagnosis present

## 2020-03-31 DIAGNOSIS — F913 Oppositional defiant disorder: Secondary | ICD-10-CM | POA: Diagnosis present

## 2020-03-31 DIAGNOSIS — N179 Acute kidney failure, unspecified: Secondary | ICD-10-CM | POA: Diagnosis present

## 2020-03-31 DIAGNOSIS — N281 Cyst of kidney, acquired: Secondary | ICD-10-CM | POA: Diagnosis present

## 2020-03-31 DIAGNOSIS — J449 Chronic obstructive pulmonary disease, unspecified: Secondary | ICD-10-CM | POA: Diagnosis present

## 2020-03-31 DIAGNOSIS — F329 Major depressive disorder, single episode, unspecified: Secondary | ICD-10-CM | POA: Diagnosis present

## 2020-03-31 DIAGNOSIS — G4733 Obstructive sleep apnea (adult) (pediatric): Secondary | ICD-10-CM | POA: Diagnosis present

## 2020-03-31 DIAGNOSIS — R778 Other specified abnormalities of plasma proteins: Secondary | ICD-10-CM | POA: Diagnosis present

## 2020-03-31 DIAGNOSIS — R7402 Elevation of levels of lactic acid dehydrogenase (LDH): Secondary | ICD-10-CM | POA: Diagnosis present

## 2020-03-31 DIAGNOSIS — R042 Hemoptysis: Secondary | ICD-10-CM | POA: Diagnosis present

## 2020-03-31 DIAGNOSIS — Z8249 Family history of ischemic heart disease and other diseases of the circulatory system: Secondary | ICD-10-CM

## 2020-03-31 DIAGNOSIS — D631 Anemia in chronic kidney disease: Secondary | ICD-10-CM | POA: Diagnosis present

## 2020-03-31 DIAGNOSIS — Z5329 Procedure and treatment not carried out because of patient's decision for other reasons: Secondary | ICD-10-CM | POA: Diagnosis present

## 2020-03-31 DIAGNOSIS — Z20822 Contact with and (suspected) exposure to covid-19: Secondary | ICD-10-CM | POA: Diagnosis present

## 2020-03-31 DIAGNOSIS — Z9119 Patient's noncompliance with other medical treatment and regimen: Secondary | ICD-10-CM | POA: Diagnosis not present

## 2020-03-31 DIAGNOSIS — S06369S Traumatic hemorrhage of cerebrum, unspecified, with loss of consciousness of unspecified duration, sequela: Secondary | ICD-10-CM

## 2020-03-31 DIAGNOSIS — I161 Hypertensive emergency: Secondary | ICD-10-CM | POA: Diagnosis present

## 2020-03-31 DIAGNOSIS — Z992 Dependence on renal dialysis: Secondary | ICD-10-CM

## 2020-03-31 DIAGNOSIS — G6289 Other specified polyneuropathies: Secondary | ICD-10-CM | POA: Diagnosis present

## 2020-03-31 DIAGNOSIS — M79603 Pain in arm, unspecified: Secondary | ICD-10-CM | POA: Diagnosis present

## 2020-03-31 DIAGNOSIS — R451 Restlessness and agitation: Secondary | ICD-10-CM | POA: Diagnosis not present

## 2020-03-31 DIAGNOSIS — R809 Proteinuria, unspecified: Secondary | ICD-10-CM | POA: Diagnosis present

## 2020-03-31 DIAGNOSIS — I132 Hypertensive heart and chronic kidney disease with heart failure and with stage 5 chronic kidney disease, or end stage renal disease: Secondary | ICD-10-CM | POA: Diagnosis present

## 2020-03-31 DIAGNOSIS — J9601 Acute respiratory failure with hypoxia: Secondary | ICD-10-CM | POA: Diagnosis present

## 2020-03-31 DIAGNOSIS — N186 End stage renal disease: Secondary | ICD-10-CM | POA: Diagnosis present

## 2020-03-31 DIAGNOSIS — I422 Other hypertrophic cardiomyopathy: Secondary | ICD-10-CM | POA: Diagnosis present

## 2020-03-31 DIAGNOSIS — D6959 Other secondary thrombocytopenia: Secondary | ICD-10-CM | POA: Diagnosis present

## 2020-03-31 DIAGNOSIS — I129 Hypertensive chronic kidney disease with stage 1 through stage 4 chronic kidney disease, or unspecified chronic kidney disease: Secondary | ICD-10-CM | POA: Diagnosis present

## 2020-03-31 DIAGNOSIS — I5031 Acute diastolic (congestive) heart failure: Secondary | ICD-10-CM | POA: Diagnosis present

## 2020-03-31 DIAGNOSIS — E871 Hypo-osmolality and hyponatremia: Secondary | ICD-10-CM | POA: Diagnosis present

## 2020-03-31 DIAGNOSIS — F909 Attention-deficit hyperactivity disorder, unspecified type: Secondary | ICD-10-CM | POA: Diagnosis present

## 2020-03-31 DIAGNOSIS — F1290 Cannabis use, unspecified, uncomplicated: Secondary | ICD-10-CM | POA: Diagnosis present

## 2020-03-31 DIAGNOSIS — F17210 Nicotine dependence, cigarettes, uncomplicated: Secondary | ICD-10-CM | POA: Diagnosis present

## 2020-03-31 DIAGNOSIS — I10 Essential (primary) hypertension: Secondary | ICD-10-CM

## 2020-03-31 DIAGNOSIS — E874 Mixed disorder of acid-base balance: Secondary | ICD-10-CM | POA: Diagnosis present

## 2020-03-31 DIAGNOSIS — I160 Hypertensive urgency: Secondary | ICD-10-CM

## 2020-03-31 LAB — HEPATITIS B SURFACE ANTIGEN: Hepatitis B Surface Ag: NONREACTIVE

## 2020-03-31 LAB — TROPONIN I (HIGH SENSITIVITY)
Troponin I (High Sensitivity): 1546 ng/L (ref <18)
Troponin I (High Sensitivity): 1555 ng/L (ref <18)
Troponin I (High Sensitivity): 1531 ng/L (ref <18)

## 2020-03-31 LAB — RETICULOCYTES
Immature Retic Fract: 22 % — ABNORMAL HIGH (ref 2.3–15.9)
RBC.: 2.93 MIL/uL — ABNORMAL LOW (ref 4.22–5.81)
Retic Count, Absolute: 196.6 10*3/uL — ABNORMAL HIGH (ref 19.0–186.0)
Retic Ct Pct: 6.7 % — ABNORMAL HIGH (ref 0.4–3.1)

## 2020-03-31 LAB — BASIC METABOLIC PANEL
Anion gap: 22 — ABNORMAL HIGH (ref 5–15)
BUN: 160 mg/dL — ABNORMAL HIGH (ref 6–20)
CO2: 22 mmol/L (ref 22–32)
Calcium: 8.8 mg/dL — ABNORMAL LOW (ref 8.9–10.3)
Chloride: 84 mmol/L — ABNORMAL LOW (ref 98–111)
Creatinine, Ser: 19.08 mg/dL — ABNORMAL HIGH (ref 0.61–1.24)
GFR calc Af Amer: 3 mL/min — ABNORMAL LOW (ref >60)
GFR calc non Af Amer: 3 mL/min — ABNORMAL LOW (ref >60)
Glucose, Bld: 150 mg/dL — ABNORMAL HIGH (ref 70–99)
Potassium: 4 mmol/L (ref 3.5–5.1)
Sodium: 128 mmol/L — ABNORMAL LOW (ref 135–145)

## 2020-03-31 LAB — RAPID URINE DRUG SCREEN, HOSP PERFORMED
Amphetamines: NOT DETECTED
Barbiturates: NOT DETECTED
Benzodiazepines: NOT DETECTED
Cocaine: NOT DETECTED
Opiates: NOT DETECTED
Tetrahydrocannabinol: NOT DETECTED

## 2020-03-31 LAB — HEPATIC FUNCTION PANEL
ALT: 18 U/L (ref 0–44)
AST: 26 U/L (ref 15–41)
Albumin: 3.1 g/dL — ABNORMAL LOW (ref 3.5–5.0)
Alkaline Phosphatase: 72 U/L (ref 38–126)
Bilirubin, Direct: 0.1 mg/dL (ref 0.0–0.2)
Indirect Bilirubin: 0.9 mg/dL (ref 0.3–0.9)
Total Bilirubin: 1 mg/dL (ref 0.3–1.2)
Total Protein: 6.4 g/dL — ABNORMAL LOW (ref 6.5–8.1)

## 2020-03-31 LAB — POCT I-STAT 7, (LYTES, BLD GAS, ICA,H+H)
Bicarbonate: 23.1 mmol/L (ref 20.0–28.0)
Calcium, Ion: 1.05 mmol/L — ABNORMAL LOW (ref 1.15–1.40)
HCT: 23 % — ABNORMAL LOW (ref 39.0–52.0)
Hemoglobin: 7.8 g/dL — ABNORMAL LOW (ref 13.0–17.0)
O2 Saturation: 95 %
Patient temperature: 36
Potassium: 3.4 mmol/L — ABNORMAL LOW (ref 3.5–5.1)
Sodium: 125 mmol/L — ABNORMAL LOW (ref 135–145)
TCO2: 24 mmol/L (ref 22–32)
pCO2 arterial: 29.9 mmHg — ABNORMAL LOW (ref 32.0–48.0)
pH, Arterial: 7.492 — ABNORMAL HIGH (ref 7.350–7.450)
pO2, Arterial: 63 mmHg — ABNORMAL LOW (ref 83.0–108.0)

## 2020-03-31 LAB — PROTIME-INR
INR: 1.1 (ref 0.8–1.2)
Prothrombin Time: 14.1 seconds (ref 11.4–15.2)

## 2020-03-31 LAB — TYPE AND SCREEN
ABO/RH(D): A POS
Antibody Screen: NEGATIVE

## 2020-03-31 LAB — GLUCOSE, CAPILLARY: Glucose-Capillary: 119 mg/dL — ABNORMAL HIGH (ref 70–99)

## 2020-03-31 LAB — URINALYSIS, ROUTINE W REFLEX MICROSCOPIC
Bilirubin Urine: NEGATIVE
Glucose, UA: 50 mg/dL — AB
Ketones, ur: NEGATIVE mg/dL
Leukocytes,Ua: NEGATIVE
Nitrite: NEGATIVE
Protein, ur: >=300 mg/dL — AB
RBC / HPF: >50 RBC/hpf — ABNORMAL HIGH (ref 0–5)
Specific Gravity, Urine: 1.011 (ref 1.005–1.030)
pH: 5 (ref 5.0–8.0)

## 2020-03-31 LAB — CBC WITH DIFFERENTIAL/PLATELET
Abs Immature Granulocytes: 0.06 10*3/uL (ref 0.00–0.07)
Basophils Absolute: 0 10*3/uL (ref 0.0–0.1)
Basophils Relative: 0 %
Eosinophils Absolute: 0.1 10*3/uL (ref 0.0–0.5)
Eosinophils Relative: 1 %
HCT: 23.8 % — ABNORMAL LOW (ref 39.0–52.0)
Hemoglobin: 8.3 g/dL — ABNORMAL LOW (ref 13.0–17.0)
Immature Granulocytes: 1 %
Lymphocytes Relative: 9 %
Lymphs Abs: 0.8 10*3/uL (ref 0.7–4.0)
MCH: 31 pg (ref 26.0–34.0)
MCHC: 34.9 g/dL (ref 30.0–36.0)
MCV: 88.8 fL (ref 80.0–100.0)
Monocytes Absolute: 0.6 10*3/uL (ref 0.1–1.0)
Monocytes Relative: 7 %
Neutro Abs: 7.5 10*3/uL (ref 1.7–7.7)
Neutrophils Relative %: 82 %
Platelets: 33 10*3/uL — ABNORMAL LOW (ref 150–400)
RBC: 2.68 MIL/uL — ABNORMAL LOW (ref 4.22–5.81)
RDW: 15.7 % — ABNORMAL HIGH (ref 11.5–15.5)
WBC: 9.1 10*3/uL (ref 4.0–10.5)
nRBC: 0 % (ref 0.0–0.2)

## 2020-03-31 LAB — CBC
HCT: 24.6 % — ABNORMAL LOW (ref 39.0–52.0)
Hemoglobin: 8.4 g/dL — ABNORMAL LOW (ref 13.0–17.0)
MCH: 29.8 pg (ref 26.0–34.0)
MCHC: 34.1 g/dL (ref 30.0–36.0)
MCV: 87.2 fL (ref 80.0–100.0)
Platelets: 35 10*3/uL — ABNORMAL LOW (ref 150–400)
RBC: 2.82 MIL/uL — ABNORMAL LOW (ref 4.22–5.81)
RDW: 15.5 % (ref 11.5–15.5)
WBC: 8.9 10*3/uL (ref 4.0–10.5)
nRBC: 0 % (ref 0.0–0.2)

## 2020-03-31 LAB — RESPIRATORY PANEL BY RT PCR (FLU A&B, COVID)
Influenza A by PCR: NEGATIVE
Influenza B by PCR: NEGATIVE
SARS Coronavirus 2 by RT PCR: NEGATIVE

## 2020-03-31 LAB — I-STAT CHEM 8, ED
BUN: >130 mg/dL — ABNORMAL HIGH (ref 6–20)
Calcium, Ion: 0.92 mmol/L — ABNORMAL LOW (ref 1.15–1.40)
Chloride: 85 mmol/L — ABNORMAL LOW (ref 98–111)
Creatinine, Ser: >18 mg/dL — ABNORMAL HIGH (ref 0.61–1.24)
Glucose, Bld: 130 mg/dL — ABNORMAL HIGH (ref 70–99)
HCT: 24 % — ABNORMAL LOW (ref 39.0–52.0)
Hemoglobin: 8.2 g/dL — ABNORMAL LOW (ref 13.0–17.0)
Potassium: 3.5 mmol/L (ref 3.5–5.1)
Sodium: 124 mmol/L — ABNORMAL LOW (ref 135–145)
TCO2: 26 mmol/L (ref 22–32)

## 2020-03-31 LAB — DIC (DISSEMINATED INTRAVASCULAR COAGULATION)PANEL
D-Dimer, Quant: 10.82 ug/mL-FEU — ABNORMAL HIGH (ref 0.00–0.50)
Fibrinogen: 523 mg/dL — ABNORMAL HIGH (ref 210–475)
INR: 1.1 (ref 0.8–1.2)
Platelets: 37 10*3/uL — ABNORMAL LOW (ref 150–400)
Prothrombin Time: 14.4 seconds (ref 11.4–15.2)
aPTT: 34 seconds (ref 24–36)

## 2020-03-31 LAB — LACTIC ACID, PLASMA
Lactic Acid, Venous: 1.2 mmol/L (ref 0.5–1.9)
Lactic Acid, Venous: 1.1 mmol/L (ref 0.5–1.9)

## 2020-03-31 LAB — FERRITIN: Ferritin: 647 ng/mL — ABNORMAL HIGH (ref 24–336)

## 2020-03-31 LAB — HIV ANTIBODY (ROUTINE TESTING W REFLEX): HIV Screen 4th Generation wRfx: NONREACTIVE

## 2020-03-31 LAB — ABO/RH: ABO/RH(D): A POS

## 2020-03-31 LAB — ETHANOL: Alcohol, Ethyl (B): <10 mg/dL (ref <10)

## 2020-03-31 LAB — TECHNOLOGIST SMEAR REVIEW: Plt Morphology: DECREASED

## 2020-03-31 LAB — POC SARS CORONAVIRUS 2 AG -  ED: SARS Coronavirus 2 Ag: NEGATIVE

## 2020-03-31 LAB — TSH: TSH: 1.734 u[IU]/mL (ref 0.350–4.500)

## 2020-03-31 LAB — BRAIN NATRIURETIC PEPTIDE: B Natriuretic Peptide: 3957.9 pg/mL — ABNORMAL HIGH (ref 0.0–100.0)

## 2020-03-31 LAB — LACTATE DEHYDROGENASE: LDH: 1312 U/L — ABNORMAL HIGH (ref 98–192)

## 2020-03-31 LAB — CK: Total CK: 421 U/L — ABNORMAL HIGH (ref 49–397)

## 2020-03-31 LAB — SAVE SMEAR(SSMR), FOR PROVIDER SLIDE REVIEW

## 2020-03-31 LAB — MRSA PCR SCREENING: MRSA by PCR: NEGATIVE

## 2020-03-31 IMAGING — DX DG CHEST 1V PORT
1 series · 1 of 1 positions shown · non-contrast
Comparison: [DATE]

CLINICAL DATA: Chest pain, shortness of breath cough

EXAM:
PORTABLE CHEST 1 VIEW

[chest ap]
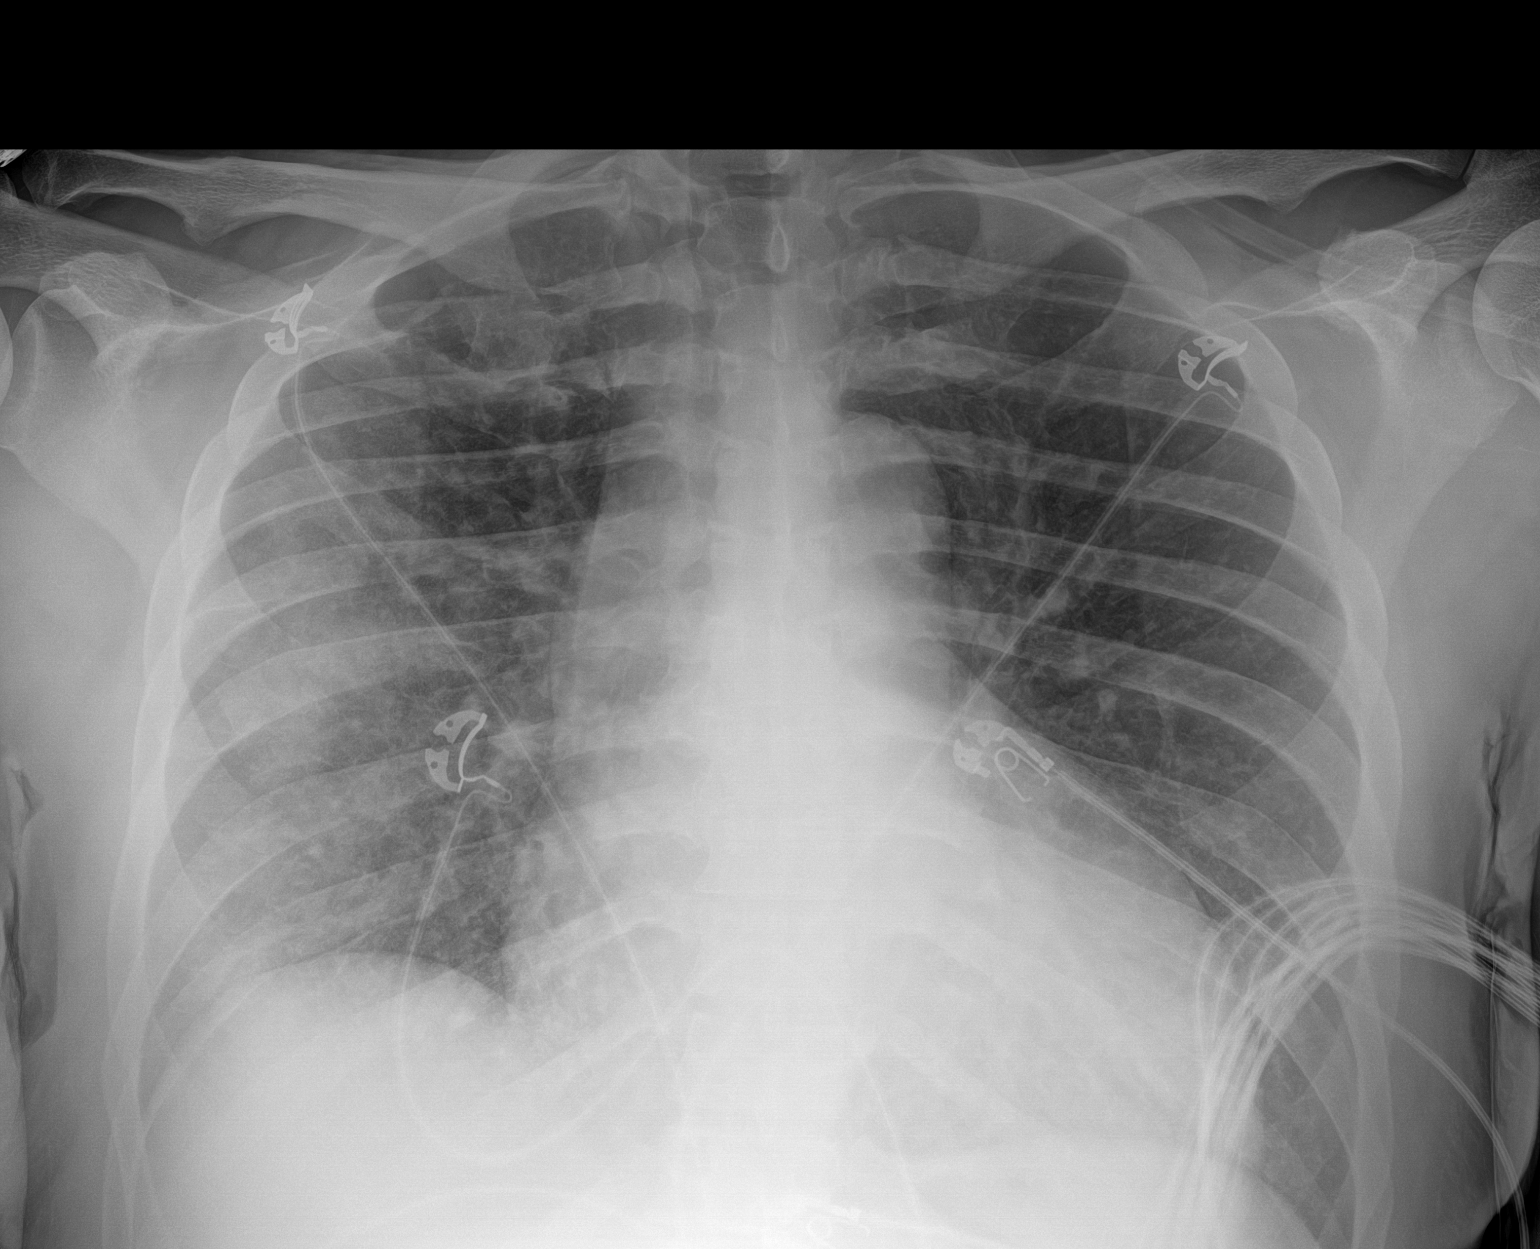

[1 of 1 positions shown; findings below may reference images not displayed]

FINDINGS: Cardiomegaly. Heterogeneous airspace opacity of peripheral right
lung. The visualized skeletal structures are unremarkable.
IMPRESSION: Cardiomegaly with heterogeneous airspace opacity of the peripheral
right lung, concerning for infection.

## 2020-03-31 IMAGING — NM NM PULMONARY PERF PARTICULATE
8 series · 8 of 8 positions shown · non-contrast
Comparison: Chest radiograph earlier this day.

CLINICAL DATA: Hemoptysis.

EXAM:
NUCLEAR MEDICINE PERFUSION LUNG SCAN
TECHNIQUE: Perfusion images were obtained in multiple projections after
intravenous injection of radiopharmaceutical.
Patient could not tolerate ventilation imaging.
RADIOPHARMACEUTICALS:  1.9 mCi [S5] MAA IV

[Series 1: ant/post perf · 4.14mm/px · 1 of 1 slices shown (1 of 2)]
[im 1/1]
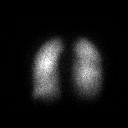

[Series 1: ant/post perf · 4.14mm/px · 1 of 1 slices shown (2 of 2)]
[im 1/1]
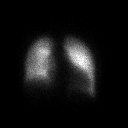

[Series 2: lao/rpo perf · 4.14mm/px · 1 of 1 slices shown (1 of 2)]
[im 1/1]
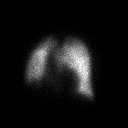

[Series 2: lao/rpo perf · 4.14mm/px · 1 of 1 slices shown (2 of 2)]
[im 1/1]
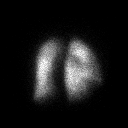

[Series 3: lpo/rao perf · 4.14mm/px · 1 of 1 slices shown (1 of 2)]
[im 1/1]
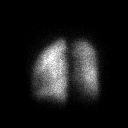

[Series 3: lpo/rao perf · 4.14mm/px · 1 of 1 slices shown (2 of 2)]
[im 1/1]
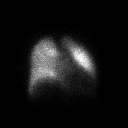

[Series 4: lt lat/rt lat perf · 4.14mm/px · 1 of 1 slices shown (1 of 2)]
[im 1/1]
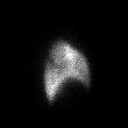

[Series 4: lt lat/rt lat perf · 4.14mm/px · 1 of 1 slices shown (2 of 2)]
[im 1/1]
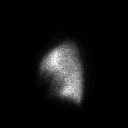

[8 of 8 positions shown; findings below may reference images not displayed]

FINDINGS: Vague decreased perfusion in the periphery of the posterior right
upper lobe and to a lesser extent right middle lobe, corresponds to
opacity on radiograph. Homogeneous perfusion to the left lung.
IMPRESSION: Vague decreased perfusion in the posterior right upper lobe and to a
lesser extent right middle lobe corresponding to abnormality on
chest radiograph. Patient could not tolerate ventilation imaging.
Findings are consistent with intermediate probability for pulmonary
embolus.

## 2020-03-31 IMAGING — CT CT HEAD W/O CM
3 series · 15 of 47 positions shown, 18 images · non-contrast
Comparison: Head CT dated [DATE].

CLINICAL DATA: Malignant hypertension.

EXAM:
CT HEAD WITHOUT CONTRAST
TECHNIQUE: Contiguous axial images were obtained from the base of the skull
through the vertex without intravenous contrast.

[Series 3: head 5.0 h30s · axial · 0.42mm/px · z∈[-151,-16]mm · 9 of 33 slices shown, 12 images]
[im 3/33  brain]
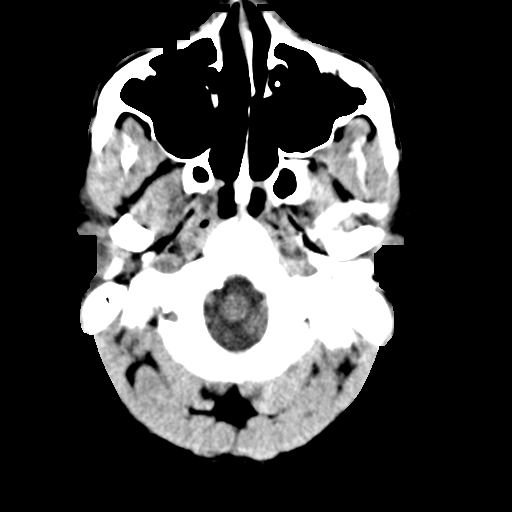
[im 3/33  bone]
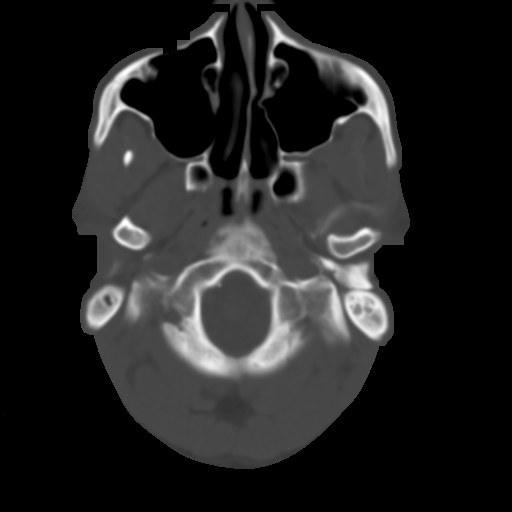
[im 6/33  brain]
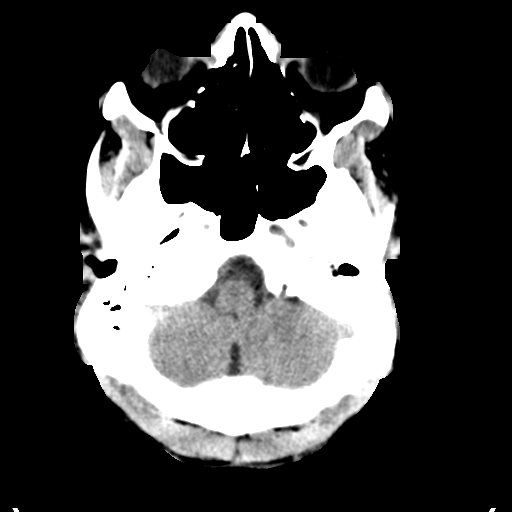
[im 9/33  brain]
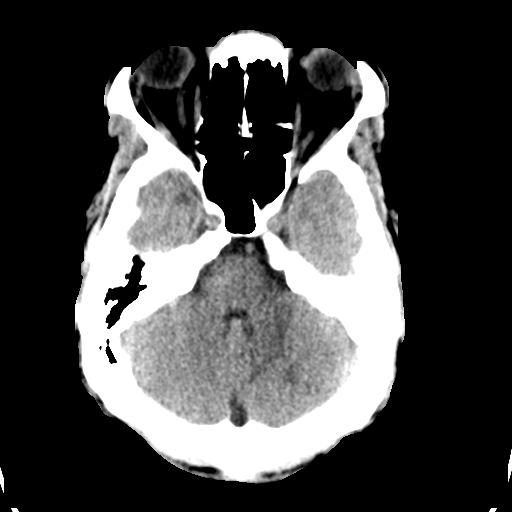
[im 13/33  brain]
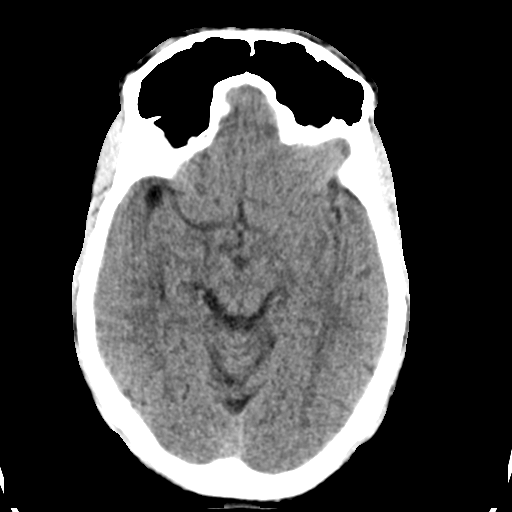
[im 17/33  brain]
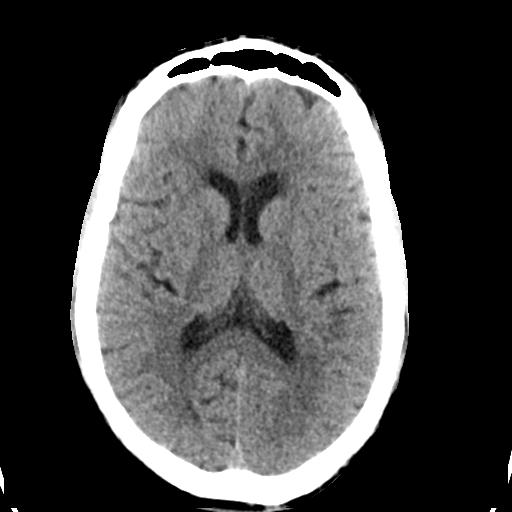
[im 17/33  bone]
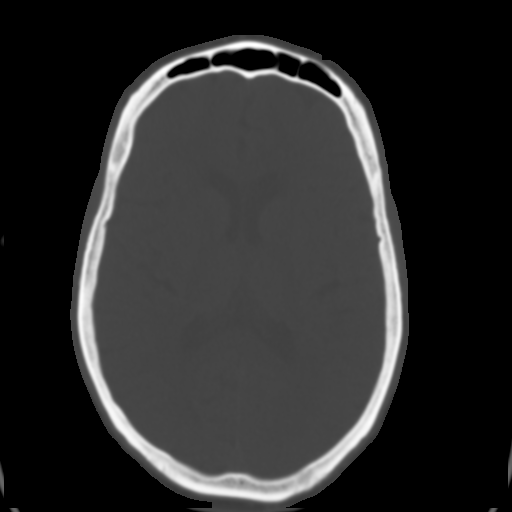
[im 20/33  brain]
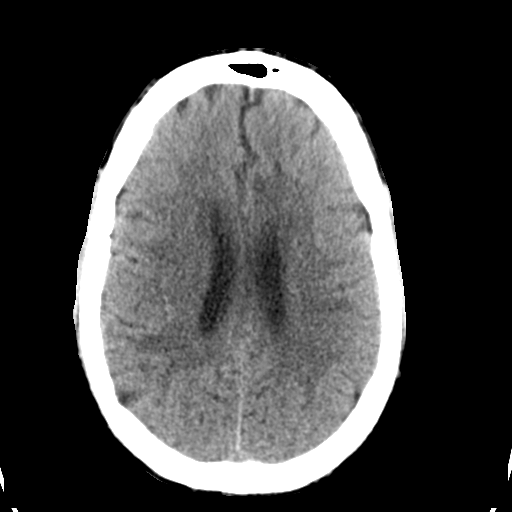
[im 24/33  brain]
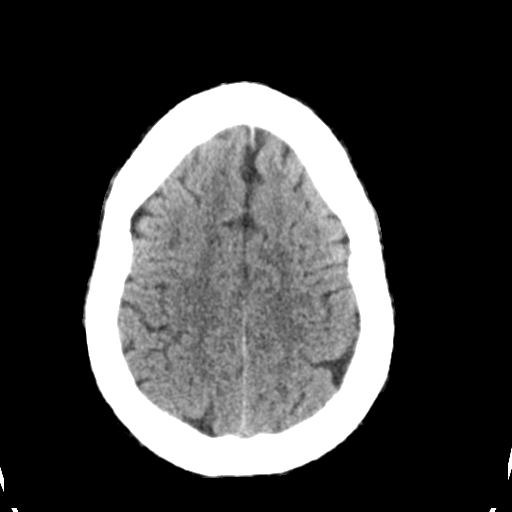
[im 27/33  brain]
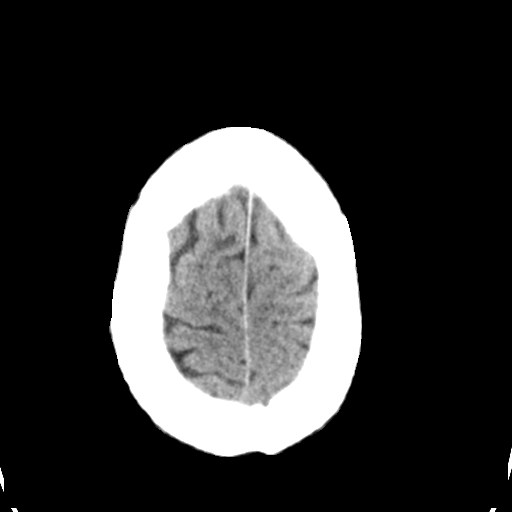
[im 30/33  brain]
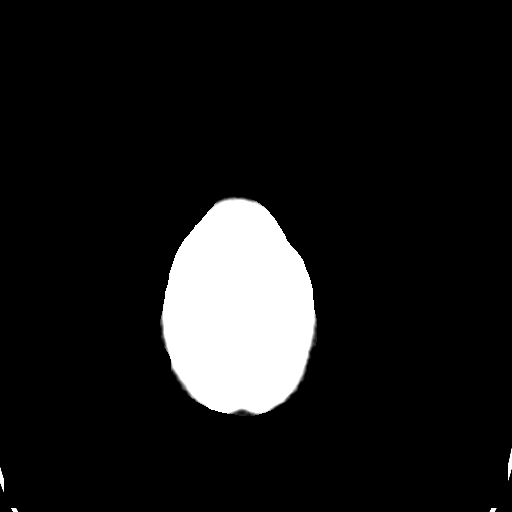
[im 30/33  bone]
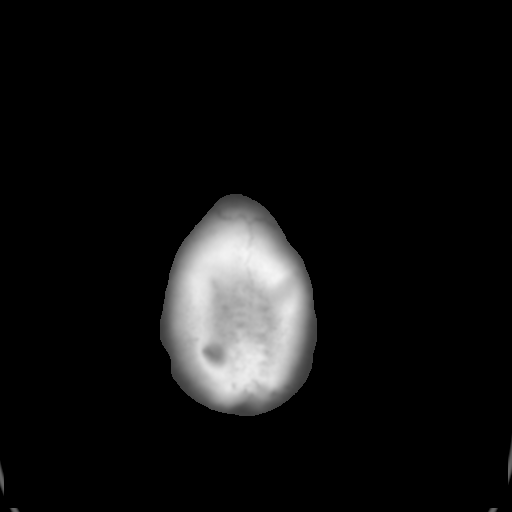

[Series 5: head 3.0 mpr cor · coronal · 0.32mm/px · 3 of 71 slices shown]
[im 24/71  brain]
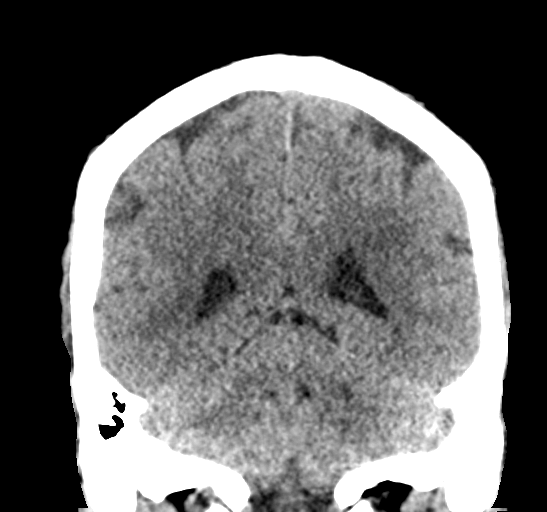
[im 32/71  brain]
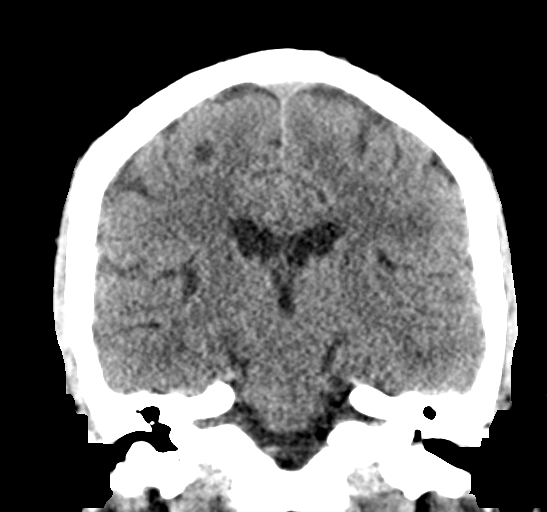
[im 39/71  brain]
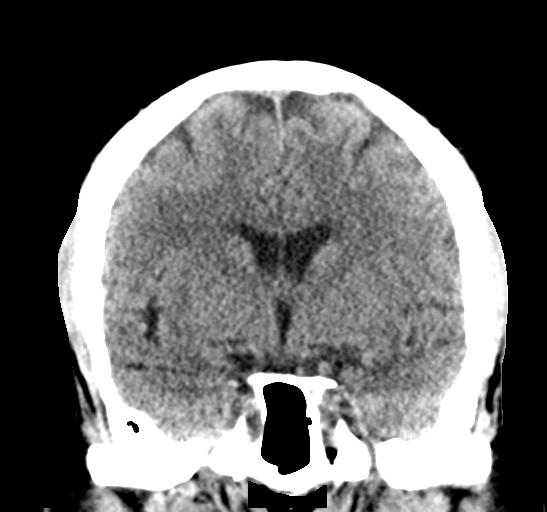

[Series 6: head 3.0 mpr sag · sagittal · 0.32mm/px · 3 of 67 slices shown]
[im 23/67  brain]
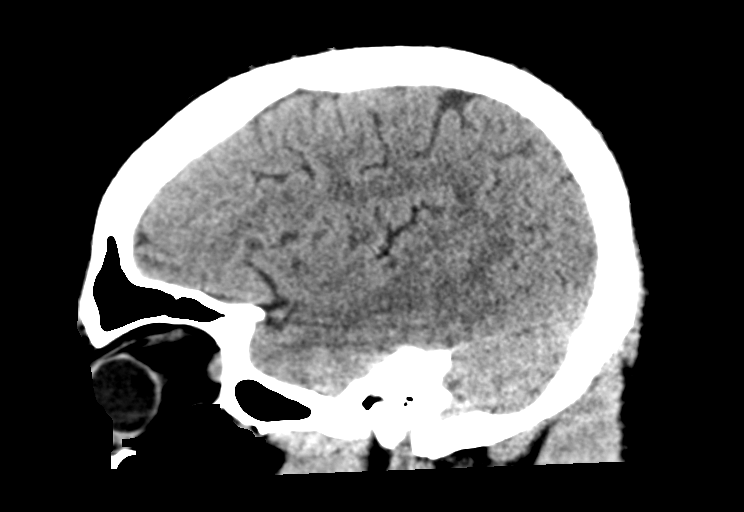
[im 34/67  brain]
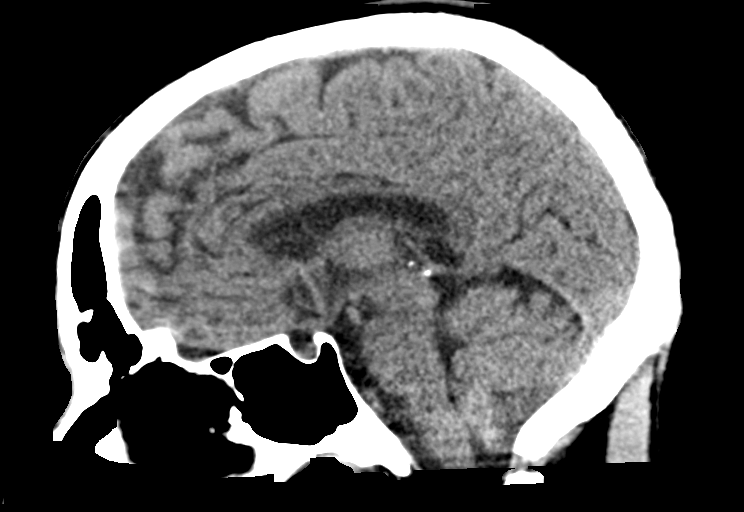
[im 45/67  brain]
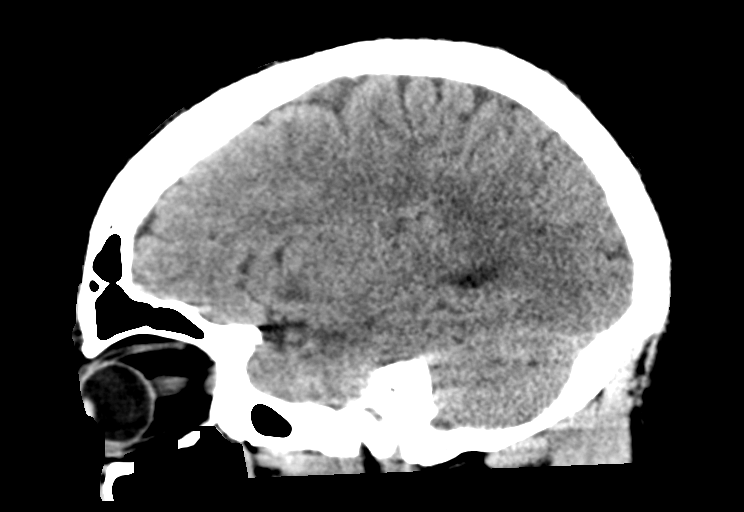

[15 of 47 positions shown; findings below may reference images not displayed]

FINDINGS: Brain: Ventricles are stable in size and configuration. Patchy
hypodensities within the bilateral periventricular and subcortical
white matter regions, of certain chronicity but favored to represent
sequela of chronic small vessel ischemia.

No mass, hemorrhage, edema or other evidence of acute parenchymal
abnormality. No extra-axial hemorrhage.

Vascular: No hyperdense vessel or unexpected calcification.

Skull: Normal. Negative for fracture or focal lesion.

Sinuses/Orbits: No acute finding.

Other: Opacification of the LEFT middle ear cavity and LEFT mastoid
air cells.
IMPRESSION: 1. No intracranial mass or hemorrhage.
2. Patchy hypodensities within the bilateral periventricular and
subcortical white matter regions, of uncertain chronicity but
favored to represent sequela of chronic small vessel ischemia.
Differential would also include demyelinating white matter disease
such as multiple sclerosis, ZKUSENOSTI and PML. Would consider brain MRI
for further characterization.
3. Opacification of the LEFT middle air cavity. Recommend clinical
correlation for possible otitis media.

## 2020-03-31 MED ORDER — ACETAMINOPHEN 325 MG PO TABS: 650.0000 mg | ORAL_TABLET | ORAL | Status: DC | PRN

## 2020-03-31 MED ORDER — ONDANSETRON HCL 4 MG/2ML IJ SOLN: 4.0000 mg | Freq: Four times a day (QID) | INTRAMUSCULAR | Status: DC | PRN

## 2020-03-31 MED ORDER — FUROSEMIDE 10 MG/ML IJ SOLN
120.0000 mg | Freq: Four times a day (QID) | INTRAVENOUS | Status: AC
  Administered 2020-03-31 – 2020-04-01 (×2): 120 mg via INTRAVENOUS
  Filled 2020-03-31: qty 10
  Filled 2020-03-31: qty 2

## 2020-03-31 MED ORDER — ALBUTEROL SULFATE (2.5 MG/3ML) 0.083% IN NEBU
2.5000 mg | INHALATION_SOLUTION | Freq: Once | RESPIRATORY_TRACT | Status: AC | PRN
  Administered 2020-03-31: 22:00:00 2.5 mg via RESPIRATORY_TRACT
  Filled 2020-03-31: qty 3

## 2020-03-31 MED ORDER — TECHNETIUM TO 99M ALBUMIN AGGREGATED
1.9000 | Freq: Once | INTRAVENOUS | Status: AC | PRN
  Administered 2020-03-31: 1.9 via INTRAVENOUS

## 2020-03-31 MED ORDER — CHLORHEXIDINE GLUCONATE CLOTH 2 % EX PADS
6.0000 | MEDICATED_PAD | Freq: Every day | CUTANEOUS | Status: DC
  Administered 2020-04-03 – 2020-04-05 (×3): 6 via TOPICAL

## 2020-03-31 MED ORDER — SODIUM CHLORIDE 0.9 % IV SOLN: 500.0000 mg | Freq: Once | INTRAVENOUS | Status: DC

## 2020-03-31 MED ORDER — SODIUM CHLORIDE 0.9 % IV SOLN
2.0000 g | INTRAVENOUS | Status: DC
  Filled 2020-03-31: qty 20

## 2020-03-31 MED ORDER — NICARDIPINE HCL IN NACL 20-0.86 MG/200ML-% IV SOLN
3.0000 mg/h | INTRAVENOUS | Status: DC
  Administered 2020-03-31: 19:00:00 7.5 mg/h via INTRAVENOUS
  Administered 2020-03-31: 15:00:00 2.5 mg/h via INTRAVENOUS
  Administered 2020-03-31: 15 mg/h via INTRAVENOUS
  Administered 2020-04-01 (×2): 10 mg/h via INTRAVENOUS
  Administered 2020-04-01 (×2): 15 mg/h via INTRAVENOUS
  Filled 2020-03-31 (×11): qty 200

## 2020-03-31 MED ORDER — HEPARIN SODIUM (PORCINE) 5000 UNIT/ML IJ SOLN
INTRAMUSCULAR | Status: AC
  Administered 2020-03-31: 5000 [IU]
  Filled 2020-03-31: qty 6

## 2020-03-31 MED ORDER — CHLORHEXIDINE GLUCONATE CLOTH 2 % EX PADS
6.0000 | MEDICATED_PAD | Freq: Every day | CUTANEOUS | Status: DC
  Administered 2020-04-02: 6 via TOPICAL

## 2020-03-31 MED ORDER — ALBUTEROL SULFATE HFA 108 (90 BASE) MCG/ACT IN AERS
6.0000 | INHALATION_SPRAY | Freq: Once | RESPIRATORY_TRACT | Status: AC
  Administered 2020-03-31: 6 via RESPIRATORY_TRACT
  Filled 2020-03-31: qty 6.7

## 2020-03-31 MED ORDER — LACTATED RINGERS IV SOLN: INTRAVENOUS | Status: DC

## 2020-03-31 MED ORDER — FENTANYL CITRATE (PF) 100 MCG/2ML IJ SOLN
INTRAMUSCULAR | Status: AC
  Administered 2020-03-31: 50 ug
  Filled 2020-03-31: qty 2

## 2020-03-31 MED ORDER — SODIUM CHLORIDE 0.9 % IV SOLN
500.0000 mg | INTRAVENOUS | Status: DC
  Filled 2020-03-31: qty 500

## 2020-03-31 MED ORDER — SODIUM CHLORIDE 0.9 % IV SOLN: 1.0000 g | Freq: Once | INTRAVENOUS | Status: DC

## 2020-03-31 MED ORDER — CHLORHEXIDINE GLUCONATE CLOTH 2 % EX PADS
6.0000 | MEDICATED_PAD | Freq: Every day | CUTANEOUS | Status: DC
  Administered 2020-03-31 – 2020-04-04 (×4): 6 via TOPICAL

## 2020-03-31 MED ORDER — HEPARIN SODIUM (PORCINE) 5000 UNIT/ML IJ SOLN
5000.0000 [IU] | Freq: Three times a day (TID) | INTRAMUSCULAR | Status: DC
  Administered 2020-04-01 – 2020-04-02 (×3): 5000 [IU] via SUBCUTANEOUS
  Filled 2020-03-31 (×5): qty 1
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
## 2020-03-31 NOTE — ED Triage Notes (Signed)
Onset 1 week ago pt reported that he walked home 8.3 miles and has not felt good since then.  Vomited several times last week.  Onset 2 days shortness of breath.  Seeing dark and colored spots in left eye.  BP elevated.

## 2020-03-31 NOTE — ED Notes (Signed)
Patient presents to urgent care with shortness of breath and floaters in his peripherals. Patient reports he has a hx of asthma. Patient O2 sitting between 91-93% in lobby. Pt brought into triage and VS completed. HR 109 and BP is 244/155. Consulted Georgina Peer, NP and Augusto Gamble, NP. Patient rerouted to ED by private vehicle, escorted by his sister.

## 2020-03-31 NOTE — Procedures (Signed)
Hemodialysis Catheter Insertion Procedure Note Douglas [name] [id number] Mar 10, 1986  Procedure: Insertion of Hemodialysis Catheter Indications: Dialysis Access   Procedure Details Consent: Risks of procedure as well as the alternatives and risks of each were explained to the (patient/caregiver).  Consent for procedure obtained. Time Out: Verified patient identification, verified procedure, site/side was marked, verified correct patient position, special equipment/implants available, medications/allergies/relevent history reviewed, required imaging and test results available.  Performed  Maximum sterile technique was used including antiseptics, cap, gloves, gown, hand hygiene, mask and sheet. Skin prep: Chlorhexidine; local anesthetic administered Triple lumen hemodialysis catheter was inserted into right femoral vein due to multiple attempts, pt restless and moving, collapsible IJ's,  using the Seldinger technique and verified with [place].  Evaluation Blood flow good Complications: No apparent complications Patient did tolerate procedure well. Chest X-ray ordered to verify placement.  CXR: not indicated.        Douglas [name] Douglas [name] 03/31/2020

## 2020-03-31 NOTE — ED Notes (Signed)
Patient transported to CT 

## 2020-03-31 NOTE — ED Provider Notes (Signed)
[facility] EMERGENCY DEPARTMENT Provider Note   CSN: [id number] Arrival date & time: 03/31/20  1358     History Chief Complaint  Patient presents with  . Hypertension  . Shortness of Breath    Douglas [name] is a 34 y.o. male.  HPI Patient reports that he has bronchitis.  He thinks he needs an inhaler.  He reports he walked about 8.3 miles a week ago and has not felt good since then.  He states he has some chest congestion.  No fever that he is documented but he feels that he has had some chills last night.  He also advises he is seeing some dark spots in his vision in the left eye.  At baseline he has disconjugate gaze from very distant traumatic injury involving the right eye.  Patient denies he has a headache.  He denies chest pain.  He does advise that he had been vomiting quite a bit over the past 2 days but was able to eat this morning and has not had any further vomiting.  He does advise he is a daily smoker of cigarettes.  Also some marijuana use.  No use of cocaine or narcotic drugs of abuse.  I asked the patient about hypertension.  He advises that he is been hypertensive since he was 34 years old.  He reports that when he was a kid he was on a lot of medications.  He reports there was something enlarged about his heart.  However, he reports its been over 15 to 20 years since he is actually gone to see a doctor.  He reports he does not take any antihypertensive medications.  He denies having a home monitor or going and getting his blood pressure checked sporadically at a pharmacy.  Ostensibly he does not know what his blood pressure has been running over the past 10 or more years.  Despite describing some significant medical conditions in childhood, patient did not know what the diagnosis was.  He reports he does have asthmatic bronchitis.  He denies having had an inhaler for a long time but he thinks that is what he needs.  After the initial portion of the interview  I did counsel the patient that his blood pressures were exceptionally high and physical examination was reflective of problems with his heart and lungs.  He was advised at first contact that he would need to be admitted to the hospital.    Past Medical History:  Diagnosis Date  . Asthma   . Brain injury ([place])   . COPD (chronic obstructive pulmonary disease) ([place])   . Hypertension   . Sleep apnea     There are no problems to display for this patient.   Past Surgical History:  Procedure Laterality Date  . MYRINGOTOMY         History reviewed. No pertinent family history.  Social History   Tobacco Use  . Smoking status: Current Every Day Smoker    Types: Cigarettes  Substance Use Topics  . Alcohol use: Yes  . Drug use: Yes    Types: Marijuana    Home Medications Prior to Admission medications   Medication Sig Start Date End Date Taking? Authorizing Provider  azithromycin (ZITHROMAX) 250 MG tablet Take 1 tablet (250 mg total) by mouth daily. Take first 2 tablets together, then 1 every day until finished. 07/04/14   Glendell Docker, NP    Allergies    Patient has no known allergies.  Review of  Systems   Review of Systems 10 Systems reviewed and are negative for acute change except as noted in the HPI.  Physical Exam Updated Vital Signs BP (!) 226/147   Pulse (!) 117   Temp 98.3 F (36.8 C) (Oral)   Resp (!) 23   Ht 6\' 5"  (1.956 m)   Wt 124.7 kg   SpO2 96%   BMI 32.61 kg/m   Physical Exam Constitutional:      Comments: Patient is alert.  Mental status is clear.  He is speaking in full sentences.  Occasional moist sounding cough.  Tachypnea.  HENT:     Head: Normocephalic and atraumatic.     Mouth/Throat:     Comments: Mouth slightly dry.  He has some whitish-gray plaque on the tongue.  Posterior oropharynx is widely patent. Eyes:     Comments: Disconjugate movement of the right and the left eye.  Left is the normal motion eye.  Cardiovascular:      Comments: Borderline tachycardia.  S3 gallop. Pulmonary:     Comments: Tachypnea.  Occasional wet cough.  Crackles bilateral lung bases.  No wheeze. Abdominal:     General: There is no distension.     Palpations: Abdomen is soft.     Tenderness: There is no abdominal tenderness. There is no guarding.  Musculoskeletal:        General: No swelling or tenderness. Normal range of motion.     Right lower leg: No edema.     Left lower leg: No edema.  Skin:    General: Skin is warm and dry.     Coloration: Skin is pale.  Neurological:     General: No focal deficit present.     Mental Status: He is oriented to person, place, and time.     Cranial Nerves: No cranial nerve deficit.     Coordination: Coordination normal.  Psychiatric:        Mood and Affect: Mood normal.     ED Results / Procedures / Treatments   Labs (all labs ordered are listed, but only abnormal results are displayed) Labs Reviewed  BASIC METABOLIC PANEL - Abnormal; Notable for the following components:      Result Value   Sodium 128 (*)    Chloride 84 (*)    Glucose, Bld 150 (*)    BUN 160 (*)    Creatinine, Ser 19.08 (*)    Calcium 8.8 (*)    GFR calc non Af Amer 3 (*)    GFR calc Af Amer 3 (*)    Anion gap 22 (*)    All other components within normal limits  CBC - Abnormal; Notable for the following components:   RBC 2.82 (*)    Hemoglobin 8.4 (*)    HCT 24.6 (*)    Platelets 35 (*)    All other components within normal limits  HEPATIC FUNCTION PANEL - Abnormal; Notable for the following components:   Total Protein 6.4 (*)    Albumin 3.1 (*)    All other components within normal limits  CK - Abnormal; Notable for the following components:   Total CK 421 (*)    All other components within normal limits  CBC WITH DIFFERENTIAL/PLATELET - Abnormal; Notable for the following components:   RBC 2.68 (*)    Hemoglobin 8.3 (*)    HCT 23.8 (*)    RDW 15.7 (*)    Platelets 33 (*)    All other components  within normal limits  I-STAT CHEM 8, ED - Abnormal; Notable for the following components:   Sodium 124 (*)    Chloride 85 (*)    BUN >130 (*)    Creatinine, Ser >18.00 (*)    Glucose, Bld 130 (*)    Calcium, Ion 0.92 (*)    Hemoglobin 8.2 (*)    HCT 24.0 (*)    All other components within normal limits  TROPONIN I (HIGH SENSITIVITY) - Abnormal; Notable for the following components:   Troponin I (High Sensitivity) 1,546 (*)    All other components within normal limits  RESPIRATORY PANEL BY RT PCR (FLU A&B, COVID)  ETHANOL  PROTIME-INR  TSH  BRAIN NATRIURETIC PEPTIDE  URINALYSIS, ROUTINE W REFLEX MICROSCOPIC  RAPID URINE DRUG SCREEN, HOSP PERFORMED  POC SARS CORONAVIRUS 2 AG -  ED  TROPONIN I (HIGH SENSITIVITY)  TROPONIN I (HIGH SENSITIVITY)    EKG EKG Interpretation  Date/Time:  Sunday March 31 2020 14:18:32 EDT Ventricular Rate:  112 PR Interval:  180 QRS Duration: 114 QT Interval:  347 QTC Calculation: 474 R Axis:   -5 Text Interpretation: Sinus tachycardia Biatrial enlargement LVH with IVCD and secondary repol abnrm ST elevation suggests acute pericarditis Borderline prolonged QT interval Confirmed by Virgel Manifold [phone]) on 03/31/2020 3:58:47 PM   Radiology CT Head Wo Contrast  Result Date: 03/31/2020 CLINICAL DATA:  Malignant hypertension. EXAM: CT HEAD WITHOUT CONTRAST TECHNIQUE: Contiguous axial images were obtained from the base of the skull through the vertex without intravenous contrast. COMPARISON:  Head CT dated 08/09/2008. FINDINGS: Brain: Ventricles are stable in size and configuration. Patchy hypodensities within the bilateral periventricular and subcortical white matter regions, of certain chronicity but favored to represent sequela of chronic small vessel ischemia. No mass, hemorrhage, edema or other evidence of acute parenchymal abnormality. No extra-axial hemorrhage. Vascular: No hyperdense vessel or unexpected calcification. Skull: Normal. Negative for  fracture or focal lesion. Sinuses/Orbits: No acute finding. Other: Opacification of the LEFT middle ear cavity and LEFT mastoid air cells. IMPRESSION: 1. No intracranial mass or hemorrhage. 2. Patchy hypodensities within the bilateral periventricular and subcortical white matter regions, of uncertain chronicity but favored to represent sequela of chronic small vessel ischemia. Differential would also include demyelinating white matter disease such as multiple sclerosis, ADEM and PML. Would consider brain MRI for further characterization. 3. Opacification of the LEFT middle air cavity. Recommend clinical correlation for possible otitis media. Electronically Signed   By: Franki Cabot M.D.   On: 03/31/2020 15:55   DG Chest Port 1 View  Result Date: 03/31/2020 CLINICAL DATA:  Chest pain, shortness of breath cough EXAM: PORTABLE CHEST 1 VIEW COMPARISON:  07/04/2014 FINDINGS: Cardiomegaly. Heterogeneous airspace opacity of peripheral right lung. The visualized skeletal structures are unremarkable. IMPRESSION: Cardiomegaly with heterogeneous airspace opacity of the peripheral right lung, concerning for infection. Electronically Signed   By: Eddie Candle M.D.   On: 03/31/2020 15:07    Procedures Procedures (including critical care time) CRITICAL CARE Performed by: Charlesetta Shanks   Total critical care time: 30 minutes  Critical care time was exclusive of separately billable procedures and treating other patients.  Critical care was necessary to treat or prevent imminent or life-threatening deterioration.  Critical care was time spent personally by me on the following activities: development of treatment plan with patient and/or surrogate as well as nursing, discussions with consultants, evaluation of patient's response to treatment, examination of patient, obtaining history from patient or surrogate, ordering and performing treatments and interventions, ordering and review  of laboratory studies, ordering  and review of radiographic studies, pulse oximetry and re-evaluation of patient's condition.Medications Ordered in ED Medications  nicardipine (CARDENE) 20mg  in 0.86% saline 230ml IV infusion (0.1 mg/ml) (2.5 mg/hr Intravenous New Bag/Given 03/31/20 1511)  cefTRIAXone (ROCEPHIN) 1 g in sodium chloride 0.9 % 100 mL IVPB (has no administration in time range)  azithromycin (ZITHROMAX) 500 mg in sodium chloride 0.9 % 250 mL IVPB (has no administration in time range)  albuterol (VENTOLIN HFA) 108 (90 Base) MCG/ACT inhaler 6 puff (6 puffs Inhalation Given 03/31/20 1606)    ED Course  I have reviewed the triage vital signs and the nursing notes.  Pertinent labs & imaging results that were available during my care of the patient were reviewed by me and considered in my medical decision making (see chart for details).    MDM Rules/Calculators/A&P                     Patient presents as outlined above.  Patient has hypertensive emergency.  S3 gallop and crackles present on exam.  Patient is experiencing visual dysfunction.  Cardene drip initiated.  Orders placed for full diagnostic evaluation.  Patient has been advised that he needs admission to the hospital.  Dr. Wilson Singer had to assume care for the patient with review of diagnostic results and disposition.  Patient presents with longstanding untreated hypertension and acute on chronic complications. Final Clinical Impression(s) / ED Diagnoses Final diagnoses:  Malignant hypertension    Rx / DC Orders ED Discharge Orders    None       Charlesetta Shanks, MD 03/31/20 1610
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
## 2020-03-31 NOTE — ED Notes (Signed)
[place], [phone]  MOM Reggie [phone]   DAD

## 2020-04-01 ENCOUNTER — Inpatient Hospital Stay (HOSPITAL_COMMUNITY): Payer: Medicaid Other

## 2020-04-01 ENCOUNTER — Encounter (HOSPITAL_COMMUNITY): Payer: Self-pay | Admitting: Internal Medicine

## 2020-04-01 DIAGNOSIS — R042 Hemoptysis: Secondary | ICD-10-CM

## 2020-04-01 DIAGNOSIS — R0602 Shortness of breath: Secondary | ICD-10-CM

## 2020-04-01 DIAGNOSIS — I161 Hypertensive emergency: Principal | ICD-10-CM

## 2020-04-01 LAB — RENAL FUNCTION PANEL
Albumin: 3.1 g/dL — ABNORMAL LOW (ref 3.5–5.0)
Anion gap: 19 — ABNORMAL HIGH (ref 5–15)
BUN: 100 mg/dL — ABNORMAL HIGH (ref 6–20)
CO2: 23 mmol/L (ref 22–32)
Calcium: 8.2 mg/dL — ABNORMAL LOW (ref 8.9–10.3)
Chloride: 91 mmol/L — ABNORMAL LOW (ref 98–111)
Creatinine, Ser: 13.11 mg/dL — ABNORMAL HIGH (ref 0.61–1.24)
GFR calc Af Amer: 5 mL/min — ABNORMAL LOW (ref >60)
GFR calc non Af Amer: 4 mL/min — ABNORMAL LOW (ref >60)
Glucose, Bld: 107 mg/dL — ABNORMAL HIGH (ref 70–99)
Phosphorus: 5 mg/dL — ABNORMAL HIGH (ref 2.5–4.6)
Potassium: 3.9 mmol/L (ref 3.5–5.1)
Sodium: 133 mmol/L — ABNORMAL LOW (ref 135–145)

## 2020-04-01 LAB — CBC
HCT: 23.4 % — ABNORMAL LOW (ref 39.0–52.0)
Hemoglobin: 7.9 g/dL — ABNORMAL LOW (ref 13.0–17.0)
MCH: 29.9 pg (ref 26.0–34.0)
MCHC: 33.8 g/dL (ref 30.0–36.0)
MCV: 88.6 fL (ref 80.0–100.0)
Platelets: 54 10*3/uL — ABNORMAL LOW (ref 150–400)
RBC: 2.64 MIL/uL — ABNORMAL LOW (ref 4.22–5.81)
RDW: 15.4 % (ref 11.5–15.5)
WBC: 7.6 10*3/uL (ref 4.0–10.5)
nRBC: 0 % (ref 0.0–0.2)

## 2020-04-01 LAB — SODIUM, URINE, RANDOM: Sodium, Ur: 41 mmol/L

## 2020-04-01 LAB — GLUCOSE, CAPILLARY
Glucose-Capillary: 102 mg/dL — ABNORMAL HIGH (ref 70–99)
Glucose-Capillary: 105 mg/dL — ABNORMAL HIGH (ref 70–99)
Glucose-Capillary: 111 mg/dL — ABNORMAL HIGH (ref 70–99)
Glucose-Capillary: 123 mg/dL — ABNORMAL HIGH (ref 70–99)
Glucose-Capillary: 137 mg/dL — ABNORMAL HIGH (ref 70–99)
Glucose-Capillary: >600 mg/dL (ref 70–99)

## 2020-04-01 LAB — CREATININE, URINE, RANDOM: Creatinine, Urine: 91.43 mg/dL

## 2020-04-01 LAB — HEPATITIS B CORE ANTIBODY, IGM: Hep B C IgM: NONREACTIVE

## 2020-04-01 LAB — ADAMTS13 ACTIVITY REFLEX

## 2020-04-01 LAB — PATHOLOGIST SMEAR REVIEW

## 2020-04-01 LAB — ECHOCARDIOGRAM COMPLETE
Height: 77 in
Weight: 3488.56 oz

## 2020-04-01 LAB — EXPECTORATED SPUTUM ASSESSMENT W GRAM STAIN, RFLX TO RESP C

## 2020-04-01 LAB — HEPATITIS B SURFACE ANTIBODY,QUALITATIVE: Hep B S Ab: NONREACTIVE

## 2020-04-01 LAB — HAPTOGLOBIN: Haptoglobin: <10 mg/dL — ABNORMAL LOW (ref 17–317)

## 2020-04-01 LAB — ADAMTS13 ACTIVITY: Adamts 13 Activity: 61.8 % — ABNORMAL LOW (ref >66.8)

## 2020-04-01 IMAGING — US US RENAL
1 series · 14 of 25 positions shown · non-contrast
Comparison: None.

CLINICAL DATA: Acute renal insufficiency.  History of hypertension.

EXAM:
RENAL / URINARY TRACT ULTRASOUND COMPLETE

[Series 1: us renal · 14 of 36 slices shown]
[im 1/36]
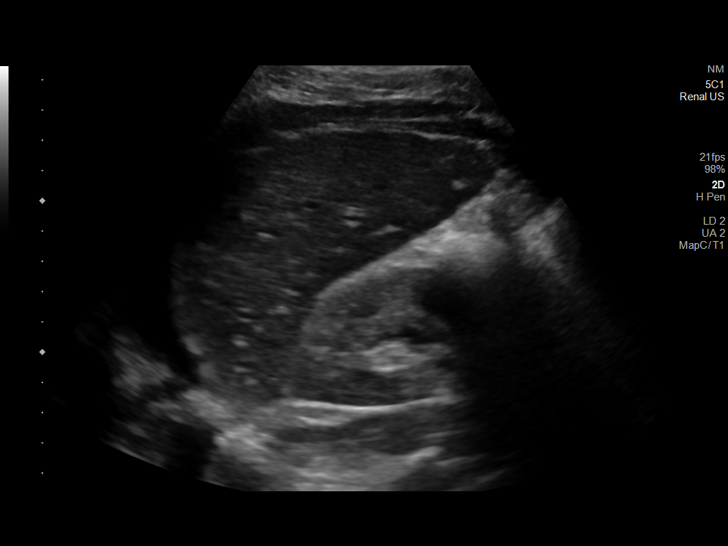
[im 3/36]
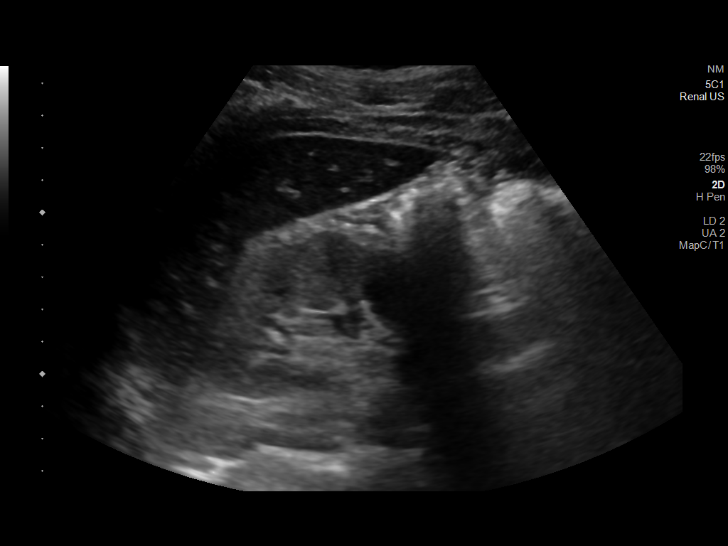
[im 6/36]
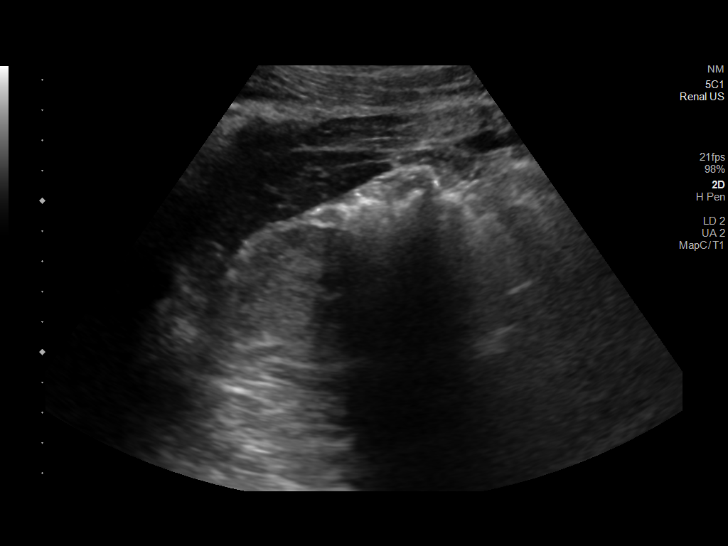
[im 9/36]
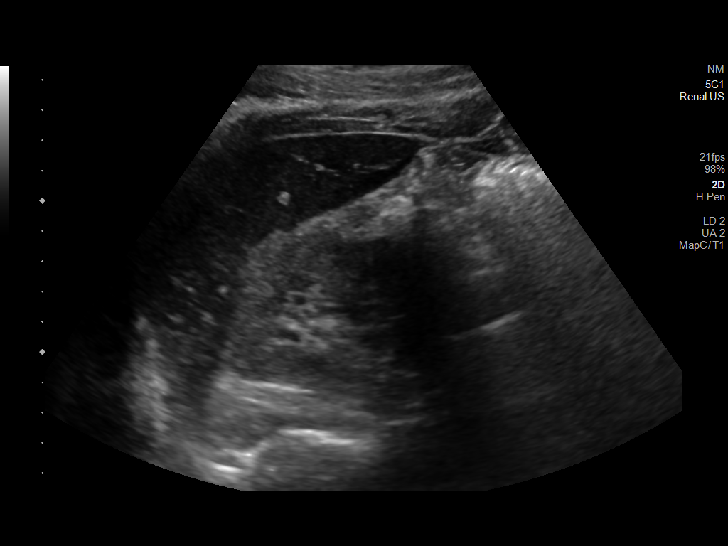
[im 12/36]
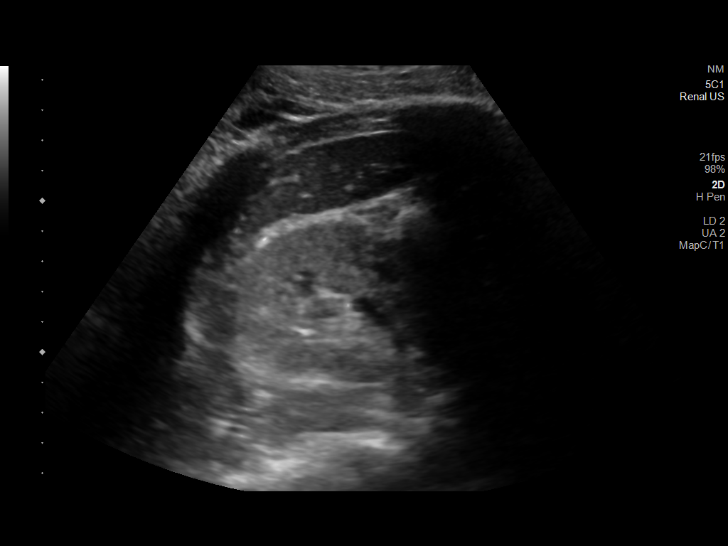
[im 14/36]
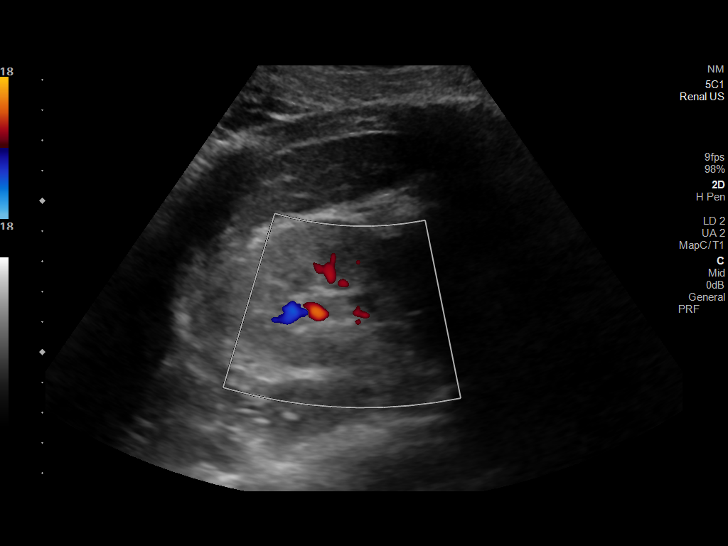
[im 17/36]
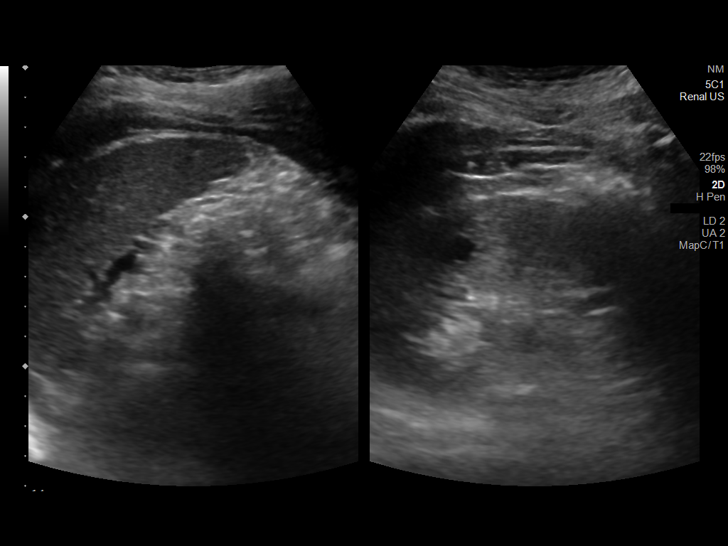
[im 19/36]
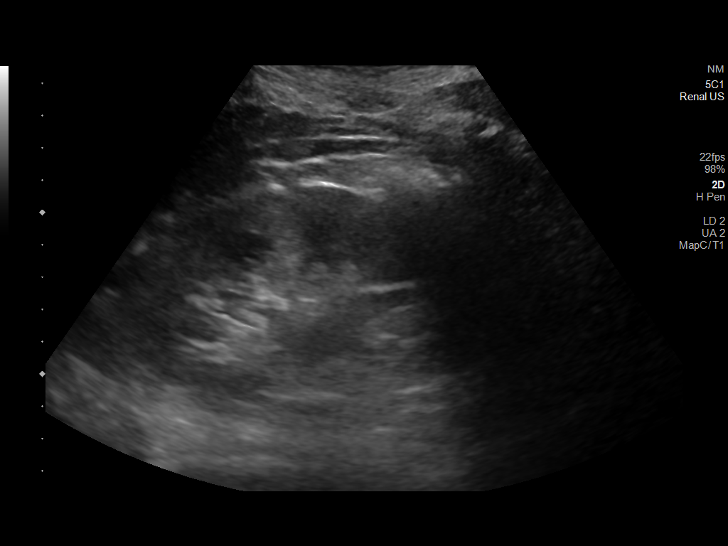
[im 22/36]
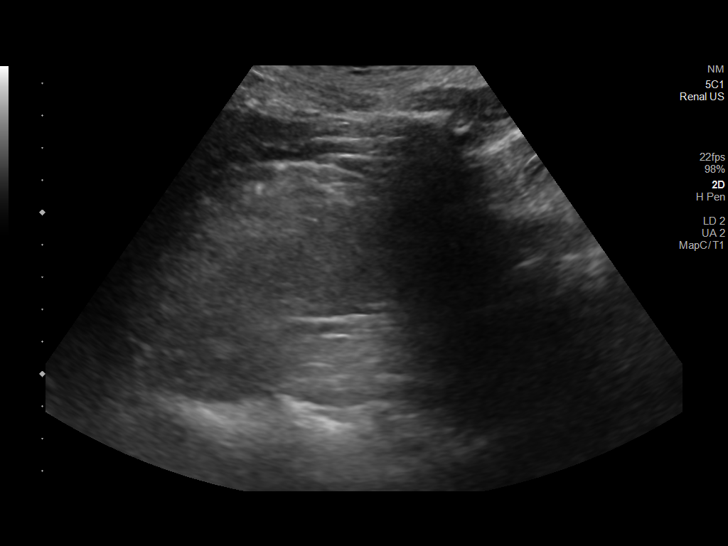
[im 24/36]
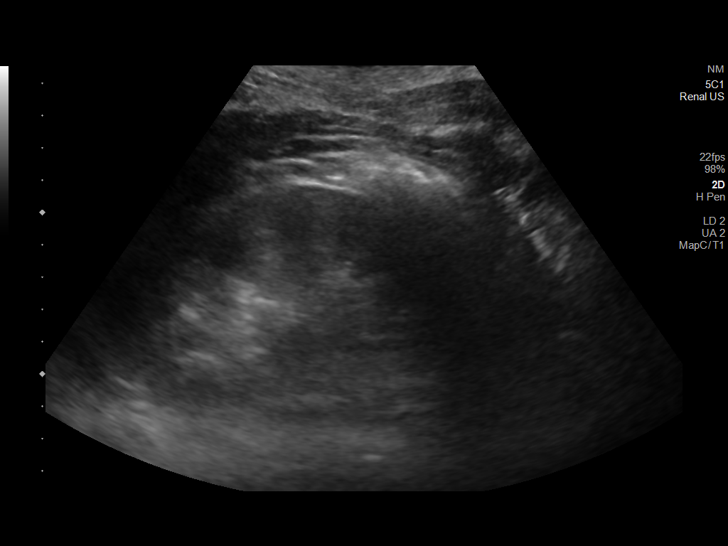
[im 27/36]
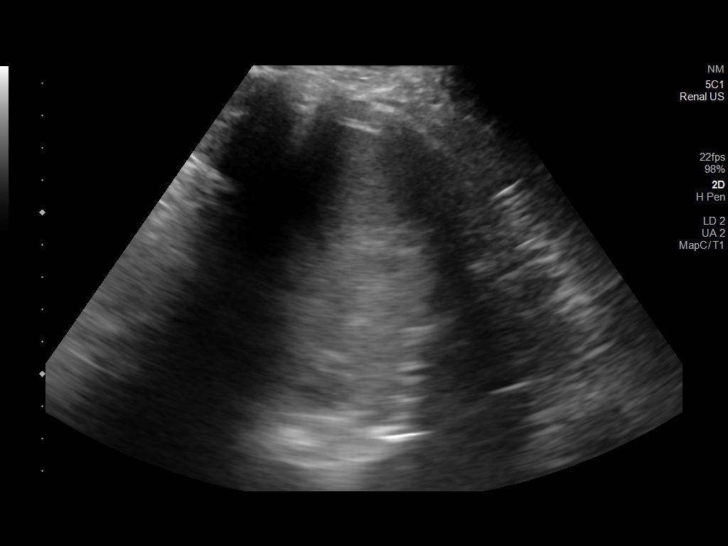
[im 30/36]
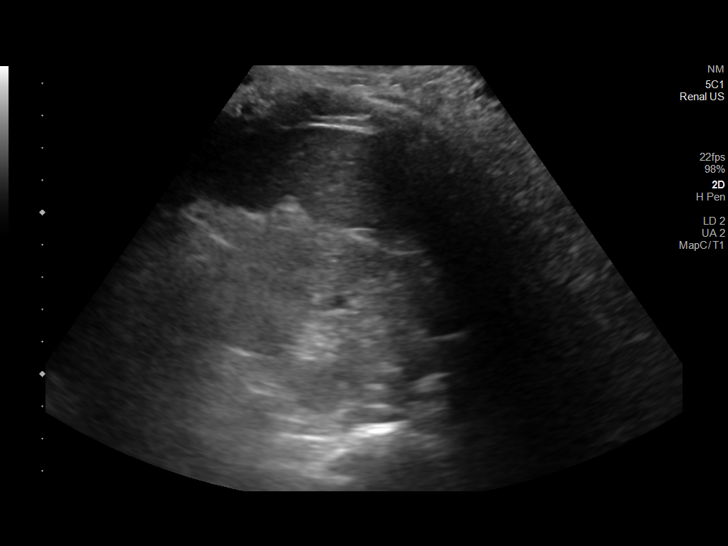
[im 33/36]
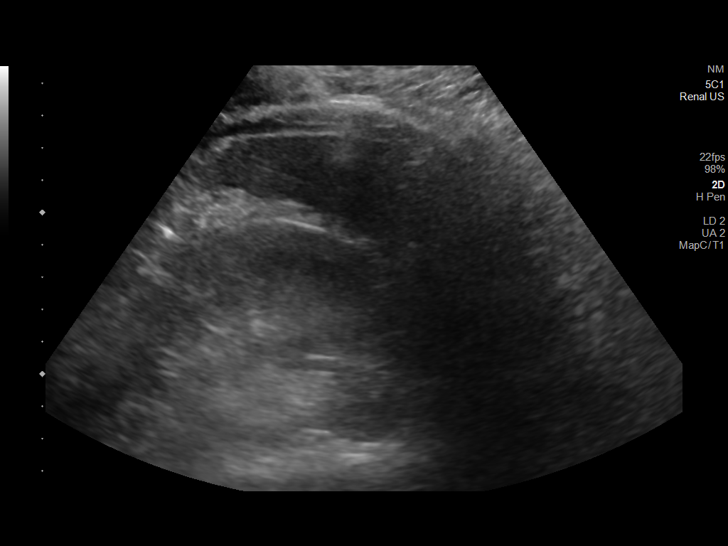
[im 36/36]
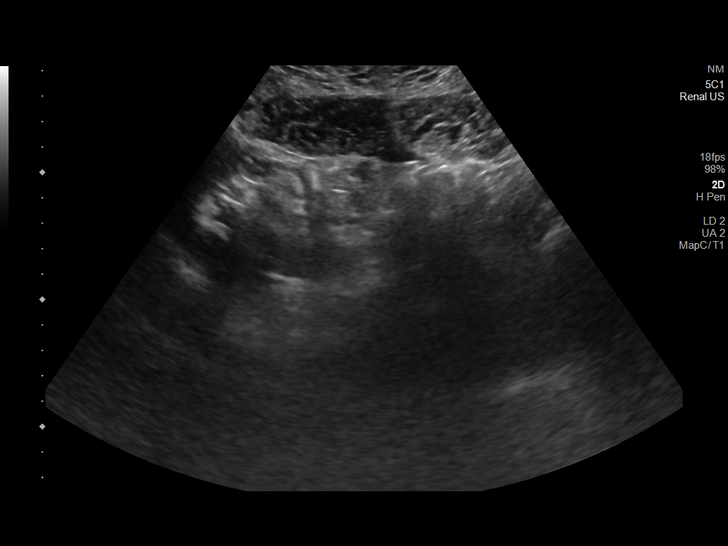

[14 of 25 positions shown; findings below may reference images not displayed]

FINDINGS: Right Kidney:

Renal measurements: 7.6 x 4.7 x 5.4 cm = volume: 100.8 ML. Increased
renal echogenicity but normal renal cortical thickness. No focal
lesions or hydronephrosis. No definite renal calculi.

Left Kidney:

Renal measurements: 11.3 x 6.4 x 5.5 cm = volume: 207.0 mL.
Increased echogenicity but normal renal cortical thickness. Slightly
complex appearing 1.4 x 1.4 x 1.4 cm cyst in the upper pole. No
hydronephrosis.

Bladder:

Very poorly distended.

Other:

None.
IMPRESSION: 1. Increased renal echogenicity suggesting medical renal disease.
2. No hydronephrosis.
3. Slightly complex 1.4 cm left renal cyst. Recommend follow-up
ultrasound examination in 6 months.
4. Decompressed bladder.

## 2020-04-01 MED ORDER — NICOTINE 21 MG/24HR TD PT24
21.0000 mg | MEDICATED_PATCH | Freq: Every day | TRANSDERMAL | Status: DC
  Filled 2020-04-01 (×4): qty 1

## 2020-04-01 MED ORDER — LABETALOL HCL 5 MG/ML IV SOLN
10.0000 mg | INTRAVENOUS | Status: DC | PRN
  Administered 2020-04-02: 20 mg via INTRAVENOUS
  Administered 2020-04-02: 10 mg via INTRAVENOUS
  Administered 2020-04-03: 20 mg via INTRAVENOUS
  Administered 2020-04-03: 10 mg via INTRAVENOUS
  Filled 2020-04-01 (×4): qty 4

## 2020-04-01 MED ORDER — HYDRALAZINE HCL 50 MG PO TABS
50.0000 mg | ORAL_TABLET | Freq: Three times a day (TID) | ORAL | Status: DC
  Administered 2020-04-01 – 2020-04-03 (×5): 50 mg via ORAL
  Filled 2020-04-01 (×7): qty 1

## 2020-04-01 MED ORDER — AMLODIPINE BESYLATE 10 MG PO TABS
10.0000 mg | ORAL_TABLET | Freq: Every day | ORAL | Status: DC
  Administered 2020-04-01 – 2020-04-04 (×4): 10 mg via ORAL
  Filled 2020-04-01 (×4): qty 1
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
## 2020-04-01 NOTE — Progress Notes (Signed)
  Echocardiogram 2D Echocardiogram has been performed.  Douglas [name] 04/01/2020, 9:44 AM

## 2020-04-01 NOTE — Progress Notes (Signed)
Lower venous duplex       has been completed. Preliminary results can be found under CV proc through chart review. Arvis Zwahlen, BS, RDMS, RVT   
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
## 2020-04-01 NOTE — Progress Notes (Signed)
24 hr urine lab inaccurate--please ignore.

## 2020-04-01 NOTE — Progress Notes (Addendum)
RN went to fix heart monitor and found the patient no longer in his room and his mom at bedside was gone as well.The patient left the unit taking off his gown getting dressed and removed the heart monitor. His mom was with him and the womens wing called to give their location. I notified with the critical care after hours pager, letting the MD know where the patient was and that he was trying to leave. The patient came back up stairs with his mom and the CCMD. Lennette Bihari agreed to stay and was place back on the heart monitor.   Saddie Benders RN

## 2020-04-01 NOTE — Progress Notes (Signed)
[facility] Progress Note Patient Name: Douglas [name] DOB: Dec 06, 1986 MRN: [id number]   Date of Service  04/01/2020  HPI/Events of Note  Unable to take PO meds due to need for BiPap use and reportedy complaining of pain on shin. Also bedside RN suggesting to start nicotine patch. Seen lethargic but verbal.  eICU Interventions  Ordered nicotine patch Will hold off on stronger pain meds as patient currently is lethargic and not complaining of pain     Intervention Category Intermediate Interventions: Pain - evaluation and management  Judd Lien 04/01/2020, 12:25 AM

## 2020-04-02 LAB — GLUCOSE, CAPILLARY
Glucose-Capillary: 101 mg/dL — ABNORMAL HIGH (ref 70–99)
Glucose-Capillary: 108 mg/dL — ABNORMAL HIGH (ref 70–99)
Glucose-Capillary: 117 mg/dL — ABNORMAL HIGH (ref 70–99)
Glucose-Capillary: 118 mg/dL — ABNORMAL HIGH (ref 70–99)
Glucose-Capillary: 144 mg/dL — ABNORMAL HIGH (ref 70–99)
Glucose-Capillary: 96 mg/dL (ref 70–99)
Glucose-Capillary: >600 mg/dL (ref 70–99)

## 2020-04-02 LAB — RENAL FUNCTION PANEL
Albumin: 3.2 g/dL — ABNORMAL LOW (ref 3.5–5.0)
Albumin: 3.3 g/dL — ABNORMAL LOW (ref 3.5–5.0)
Anion gap: 21 — ABNORMAL HIGH (ref 5–15)
Anion gap: 16 — ABNORMAL HIGH (ref 5–15)
BUN: 118 mg/dL — ABNORMAL HIGH (ref 6–20)
BUN: 73 mg/dL — ABNORMAL HIGH (ref 6–20)
CO2: 22 mmol/L (ref 22–32)
CO2: 26 mmol/L (ref 22–32)
Calcium: 9.1 mg/dL (ref 8.9–10.3)
Calcium: 9.2 mg/dL (ref 8.9–10.3)
Chloride: 90 mmol/L — ABNORMAL LOW (ref 98–111)
Chloride: 95 mmol/L — ABNORMAL LOW (ref 98–111)
Creatinine, Ser: 18.12 mg/dL — ABNORMAL HIGH (ref 0.61–1.24)
Creatinine, Ser: 12.68 mg/dL — ABNORMAL HIGH (ref 0.61–1.24)
GFR calc Af Amer: 3 mL/min — ABNORMAL LOW (ref >60)
GFR calc Af Amer: 5 mL/min — ABNORMAL LOW (ref >60)
GFR calc non Af Amer: 3 mL/min — ABNORMAL LOW (ref >60)
GFR calc non Af Amer: 5 mL/min — ABNORMAL LOW (ref >60)
Glucose, Bld: 104 mg/dL — ABNORMAL HIGH (ref 70–99)
Glucose, Bld: 113 mg/dL — ABNORMAL HIGH (ref 70–99)
Phosphorus: 8.6 mg/dL — ABNORMAL HIGH (ref 2.5–4.6)
Phosphorus: 6 mg/dL — ABNORMAL HIGH (ref 2.5–4.6)
Potassium: 4.5 mmol/L (ref 3.5–5.1)
Potassium: 4.6 mmol/L (ref 3.5–5.1)
Sodium: 133 mmol/L — ABNORMAL LOW (ref 135–145)
Sodium: 137 mmol/L (ref 135–145)

## 2020-04-02 LAB — CBC
HCT: 24.3 % — ABNORMAL LOW (ref 39.0–52.0)
Hemoglobin: 8 g/dL — ABNORMAL LOW (ref 13.0–17.0)
MCH: 30 pg (ref 26.0–34.0)
MCHC: 32.9 g/dL (ref 30.0–36.0)
MCV: 91 fL (ref 80.0–100.0)
Platelets: 81 10*3/uL — ABNORMAL LOW (ref 150–400)
RBC: 2.67 MIL/uL — ABNORMAL LOW (ref 4.22–5.81)
RDW: 16.6 % — ABNORMAL HIGH (ref 11.5–15.5)
WBC: 9.4 10*3/uL (ref 4.0–10.5)
nRBC: 0 % (ref 0.0–0.2)

## 2020-04-02 LAB — IRON AND TIBC
Iron: 76 ug/dL (ref 45–182)
Saturation Ratios: 31 % (ref 17.9–39.5)
TIBC: 248 ug/dL — ABNORMAL LOW (ref 250–450)
UIBC: 172 ug/dL

## 2020-04-02 LAB — C3 COMPLEMENT: C3 Complement: 110 mg/dL (ref 82–167)

## 2020-04-02 LAB — C4 COMPLEMENT: Complement C4, Body Fluid: 26 mg/dL (ref 12–38)

## 2020-04-02 LAB — ANA W/REFLEX IF POSITIVE
Anti Nuclear Antibody (ANA): NEGATIVE
Anti Nuclear Antibody (ANA): NEGATIVE

## 2020-04-02 LAB — LEGIONELLA PNEUMOPHILA SEROGP 1 UR AG: L. pneumophila Serogp 1 Ur Ag: NEGATIVE

## 2020-04-02 LAB — PARATHYROID HORMONE, INTACT (NO CA): PTH: 315 pg/mL — ABNORMAL HIGH (ref 15–65)

## 2020-04-02 MED ORDER — LORAZEPAM 1 MG PO TABS
1.0000 mg | ORAL_TABLET | Freq: Four times a day (QID) | ORAL | Status: DC | PRN
  Administered 2020-04-02: 1 mg via ORAL
  Filled 2020-04-02: qty 1

## 2020-04-02 MED ORDER — CALCIUM ACETATE (PHOS BINDER) 667 MG PO CAPS
1334.0000 mg | ORAL_CAPSULE | Freq: Three times a day (TID) | ORAL | Status: DC
  Administered 2020-04-02 – 2020-04-05 (×7): 1334 mg via ORAL
  Filled 2020-04-02 (×9): qty 2

## 2020-04-02 MED ORDER — HALOPERIDOL 5 MG PO TABS
5.0000 mg | ORAL_TABLET | Freq: Four times a day (QID) | ORAL | Status: DC | PRN
  Administered 2020-04-02: 5 mg via ORAL
  Filled 2020-04-02 (×3): qty 1

## 2020-04-02 MED ORDER — LORAZEPAM 2 MG/ML IJ SOLN: 2.0000 mg | Freq: Four times a day (QID) | INTRAMUSCULAR | Status: DC | PRN

## 2020-04-02 MED ORDER — DARBEPOETIN ALFA 100 MCG/0.5ML IJ SOSY
100.0000 ug | PREFILLED_SYRINGE | INTRAMUSCULAR | Status: DC
  Administered 2020-04-03: 100 ug via INTRAVENOUS

## 2020-04-02 MED ORDER — LORAZEPAM 2 MG/ML IJ SOLN: 1.0000 mg | Freq: Four times a day (QID) | INTRAMUSCULAR | Status: DC | PRN

## 2020-04-02 MED ORDER — LORAZEPAM 1 MG PO TABS
2.0000 mg | ORAL_TABLET | Freq: Four times a day (QID) | ORAL | Status: DC | PRN
  Administered 2020-04-03 – 2020-04-04 (×2): 2 mg via ORAL
  Filled 2020-04-02 (×2): qty 2

## 2020-04-02 MED ORDER — LORAZEPAM 2 MG/ML IJ SOLN: 1.0000 mg | Freq: Three times a day (TID) | INTRAMUSCULAR | Status: DC | PRN

## 2020-04-02 MED ORDER — CHLORHEXIDINE GLUCONATE CLOTH 2 % EX PADS: 6.0000 | MEDICATED_PAD | Freq: Every day | CUTANEOUS | Status: DC

## 2020-04-02 MED ORDER — HALOPERIDOL LACTATE 5 MG/ML IJ SOLN: 5.0000 mg | Freq: Once | INTRAMUSCULAR | Status: DC

## 2020-04-02 MED ORDER — HALOPERIDOL LACTATE 5 MG/ML IJ SOLN
INTRAMUSCULAR | Status: AC
  Filled 2020-04-02: qty 1

## 2020-04-02 NOTE — Progress Notes (Signed)
BIPAP not initiated tonight.

## 2020-04-02 NOTE — Progress Notes (Signed)
Responded to unit page to support patient and staff.  Upon my immediate arrival  Patient nurse said patient had left room walking and chaplain services was not really needed at this time. Will page if needed. Provided support to staff.   Jaclynn Major, [place], [facility], Pager [phone]

## 2020-04-02 NOTE — TOC Progression Note (Addendum)
Transition of Care [facility]) - Progression Note    Patient Details  Name: Douglas [name] MRN: [id number] Date of Birth: 09/24/1986  [facility] CM/SW [place], [place] Phone Number: 04/02/2020, 5:27 PM  Clinical Narrative:    Update 4/7-  CSW spoke with patient at bedside. CSW offered patient [facility] resource. Patient told CSW he does not need resource at this time.  4/6-Patient presented as a danger to self. He has a history of a TBI and demonstrates poor insight into his medical condition. Patient unsafe to leave the hospital. Requires continued medical treatment. CSW completed all paperwork for IVC.   TOC team will continue to follow.         Expected Discharge Plan and Services                                                 Social Determinants of Health (SDOH) Interventions    Readmission Risk Interventions No flowsheet data found.

## 2020-04-02 NOTE — Progress Notes (Signed)
Received patient in room post HD. Patient agitated, removing monitoring equipment, stating he was leaving AMA, walked to elevator.  Mother present with patient.  Convinced patient to return to the room for removal of peripheral IV.  Physician advised throughout the day.  Patient upset that he had been diagnosed with kidney disease by doctor "just looking at him" and is very wary of continuing HD.  Also said doctor accused him of using cocaine, feels he is being stereotyped because he went to urgent care (tricked by family) and hadn't bathed or changed his clothes.  Attending MD had spoken with patient while he was in dialysis, and after subsequent conversation with mother, decision was made for IVC pending psych consult.  Patient eloped again (accompanied by Air cabin crew) to [employer], 5th floor, and was returned by security. Has shown no signs of violence.  Talked to by many staff members and family throughout the day to attempt to de escalate and gain compliance.  Refuses re establishment of peripheral IV access, telemetry monitoring, and some medications..Limited ability to monitor vital signs.  Transitioned from tele sitter to safety sitter in early afternoon, with occasional back up from security.  Primary goal is patient safety. Brother visited, mother remains at bedside.

## 2020-04-02 NOTE — Progress Notes (Addendum)
Patient refused CPAP/Bipap tonight.  RN informed.

## 2020-04-02 NOTE — Progress Notes (Signed)
I spoke with the hospitalist and reviewed his chart briefly, he is not seen The reason I received the call is because of low platelet count He does not have TTP The schistocytes seen and low platelets are classic presentation of thrombocytopenia due to malignant hypertension. ADAMTS13 is not low He does not need treatment for low platelets He needs aggressive BP management Please call if questions arise

## 2020-04-02 NOTE — Progress Notes (Signed)
NP was on unit and noticed pt at his doorway with family, police, and security present. NP reviewed chart. NP spoke to RN. Pt has been belligerent, refusing all care, and wanting to leave. Apparently, this was a dayshift problem as well and pt was IVC'd by attending and a psych consult was ordered.  Per pt's stepdad, pt is normally a calm, caring, cooperative person and this is like nothing he has ever seen.  Pt finally agreed to go back in room and agreed to take Haldol po. NP spoke to pt and the only question he had was if he could be transferred to another hospital. Because of his earlier aggression, NP did not want to get into the details to the reasons he can not transfer, but told him he needed to stay here for now. He had no further questions, nor did the mother and step dad. Mother is staying which is good as he seems to be more calm with her here.  This is likely encephalopathy due to uremia, but per chart, he has a hx of TBI. Per step dad, he has no psych hx.  KJKG, NP [facility]

## 2020-04-02 NOTE — Progress Notes (Signed)
PROGRESS NOTE  Douglas [name] JIR:[id number] DOB: 1986-08-14 DOA: 03/31/2020 PCP: Patient, No Pcp Per  HPI/Recap of past 24 hours: HPI from [place] is a 18 yom with PMH of HTN who presented to [facility] on 03/31/20 for 3w hx of progressive SOB and was found to be in hypertensive emergency and acute renal failure. BP on admission 244/155. Creatinine 18 on admission. PCCM admitted pt, was started on a cardene drip and nephrology was consulted, pt was subsequently started on HD.  BP somewhat stabilized by PCCM, patient was taken off Cardene drip and transition to p.o. BP meds.  [facility] assumed care on 04/02/2020.    Met patient during dialysis, had a lengthy discussion about the need to stay in the hospital due to the severity of his medical condition.  Patient lacks judgment and has poor insight to the severity of his medical condition.  Patient continues to insist on leaving the hospital AMA, stating he could just go home and eat right and exercise.  Patient does not understand how severe or what is going on.  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  Spoke to patient's mother on 04/02/2020, mother is very concerned about patient as well.  Currently, patient has been agitated, restless, refusing all medications or any intervention, but was able to have dialysis this a.m.  Due to patient not understanding the severity of his medical condition, I have IVC'd patient for now until psych also comes to reevaluate patient.   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 Echogenicity seen on renal US suggests chronic component however lack of prior labs makes determining stage of chonicity difficult Chronic component likely 2/2 chronic uncontrolled hypertension Nephrology on board, had first session of dialysis on 04/01/2020, another on 04/02/2020, further HD per nephrology C3, C4 normal, pending ANA, ANCA, anti-GM  antibodies  Hypertensive emergency/malignant hypertension with pulmonary edema due to AKI on CKD vs progression of CKD BP still uncontrolled, but improved from admission Status post Cardene drip Continue amlodipine, hydralazine, ultrafiltration during dialysis, may need to adjust medication pending BP As needed labetalol Echo showed severe concentric LVH likely due to hypertensive heart disease versus infiltrative cardiomyopathy such as amyloidosis.  EF 60 to 65%, no regional wall motion abnormality  Elevated troponin Likely 2/2 demand ischemia from above Echo as above Continue BP control  Acute hypoxemic respiratory failure with respiratory alkalosis due to pulmonary edema Resolved, currently saturating well on room air Influenza, covid negative. Low suspicion for infectious etiology Intermediate risk VQ scan. D-dimer, fibrinogen elevated on admission. Well's score for PE 2.5--unlikley BC x2 neg Legionella urine negative Bilateral lower extremity Doppler negative for DVT Continue prn BiPAP  Hypervolemic hyponatremia Likely due to advanced CKD Continue HD  Thrombocytopenia  ??Likely 2/2 malignant hypertension Schistocytes present. PT/INR/PTT normal, making DIC unlikely TTP r/o with negative [id number] Spoke to heme-onc Dr. Richarda Blade on 04/02/2020, who stated a clear association of thrombocytopenia with malignant hypertension Daily CBC   Likely anemia of CKD Hgb 8.4 on admission LDH elevated at 1300 Daily CBC  Noncompliant/refusing medical care/poor insight Recently IVC'd patient on 04/02/2020 as mentioned above, as patient continuously tries to leave the hospital Psych consult pending       Malnutrition Type:      Malnutrition Characteristics:      Nutrition Interventions:       Estimated body mass index is 26.59 kg/m as calculated from the following:  Height as of this encounter: 6' 5"  (1.956 m).   Weight as of this encounter: 101.7 kg.      Code  Status: Full  Family Communication: Discussed extensively with mother on 04/02/2020  Disposition Plan: Likely home once outpatient hemodialysis has been set up and patient blood pressure has been better controlled.  Awaiting nephrology sign off as well   Consultants:  PCCM  Nephrology  Psychiatry  Procedures:  None  Antimicrobials:  None   DVT prophylaxis: Heparin   Objective: Vitals:   04/02/20 1000 04/02/20 1019 04/02/20 1100 04/02/20 1200  BP: (!) 151/92 (!) 171/93 (!) 153/112 (!) 153/112  Pulse: (!) 111 (!) 101 (!) 124 (!) 115  Resp:  20    Temp:  98 F (36.7 C)    TempSrc:  Oral    SpO2:  98%  97%  Weight:  101.7 kg    Height:        Intake/Output Summary (Last 24 hours) at 04/02/2020 1442 Last data filed at 04/02/2020 1019 Gross per 24 hour  Intake --  Output 2006 ml  Net -2006 ml   Filed Weights   04/02/20 0403 04/02/20 0715 04/02/20 1019  Weight: 103.4 kg 103.8 kg 101.7 kg    Exam:  General: NAD   Cardiovascular: S1, S2 present  Respiratory:  Mild bibasilar crackles  Abdomen: Soft, nontender, nondistended, bowel sounds present  Musculoskeletal: No bilateral pedal edema noted  Skin: Normal  Psychiatry:  Agitated mood   Data Reviewed: CBC: Recent Labs  Lab 03/31/20 1414 03/31/20 1414 03/31/20 1457 03/31/20 1500 03/31/20 1709 03/31/20 2020 04/01/20 0423 04/02/20 0347  WBC 8.9  --   --  9.1  --   --  7.6 9.4  NEUTROABS  --   --   --  7.5  --   --   --   --   HGB 8.4*   < > 8.2* 8.3*  --  7.8* 7.9* 8.0*  HCT 24.6*   < > 24.0* 23.8*  --  23.0* 23.4* 24.3*  MCV 87.2  --   --  88.8  --   --  88.6 91.0  PLT 35*  --   --  33* 37*  --  54* 81*   < > = values in this interval not displayed.   Basic Metabolic Panel: Recent Labs  Lab 03/31/20 1414 03/31/20 1457 03/31/20 2020 04/01/20 0423 04/02/20 0347  NA 128* 124* 125* 133* 133*  K 4.0 3.5 3.4* 3.9 4.5  CL 84* 85*  --  91* 90*  CO2 22  --   --  23 22  GLUCOSE 150* 130*  --   107* 104*  BUN 160* >130*  --  100* 118*  CREATININE 19.08* >18.00*  --  13.11* 18.12*  CALCIUM 8.8*  --   --  8.2* 9.1  PHOS  --   --   --  5.0* 8.6*   GFR: Estimated Creatinine Clearance: 7.3 mL/min (A) (by C-G formula based on SCr of 18.12 mg/dL (H)). Liver Function Tests: Recent Labs  Lab 03/31/20 1457 04/01/20 0423 04/02/20 0347  AST 26  --   --   ALT 18  --   --   ALKPHOS 72  --   --   BILITOT 1.0  --   --   PROT 6.4*  --   --   ALBUMIN 3.1* 3.1* 3.2*   No results for input(s): LIPASE, AMYLASE in the last 168 hours. No results for input(s): AMMONIA  in the last 168 hours. Coagulation Profile: Recent Labs  Lab 03/31/20 1455 03/31/20 1709  INR 1.1 1.1   Cardiac Enzymes: Recent Labs  Lab 03/31/20 1457  CKTOTAL 421*   BNP (last 3 results) No results for input(s): PROBNP in the last 8760 hours. HbA1C: No results for input(s): HGBA1C in the last 72 hours. CBG: Recent Labs  Lab 04/01/20 2136 04/02/20 0002 04/02/20 0407 04/02/20 1051 04/02/20 1204  GLUCAP 102* 117* 96 108* 118*   Lipid Profile: No results for input(s): CHOL, HDL, LDLCALC, TRIG, CHOLHDL, LDLDIRECT in the last 72 hours. Thyroid Function Tests: Recent Labs    03/31/20 1455  TSH 1.734   Anemia Panel: Recent Labs    03/31/20 1709 03/31/20 1730 04/02/20 0347  FERRITIN 647*  --   --   TIBC  --   --  248*  IRON  --   --  76  RETICCTPCT  --  6.7*  --    Urine analysis:    Component Value Date/Time   COLORURINE YELLOW 03/31/2020 2244   APPEARANCEUR CLOUDY (A) 03/31/2020 2244   LABSPEC 1.011 03/31/2020 2244   PHURINE 5.0 03/31/2020 2244   GLUCOSEU 50 (A) 03/31/2020 2244   HGBUR LARGE (A) 03/31/2020 2244   BILIRUBINUR NEGATIVE 03/31/2020 2244   KETONESUR NEGATIVE 03/31/2020 2244   PROTEINUR >=300 (A) 03/31/2020 2244   UROBILINOGEN 1.0 01/25/2010 0642   NITRITE NEGATIVE 03/31/2020 2244   LEUKOCYTESUR NEGATIVE 03/31/2020 2244   Sepsis  Labs: @LABRCNTIP (procalcitonin:4,lacticidven:4)  ) Recent Results (from the past 240 hour(s))  Respiratory Panel by RT PCR (Flu A&B, Covid) - Nasopharyngeal Swab     Status: None   Collection Time: 03/31/20  2:46 PM   Specimen: Nasopharyngeal Swab  Result Value Ref Range Status   SARS Coronavirus 2 by RT PCR NEGATIVE NEGATIVE Final    Comment: (NOTE) SARS-CoV-2 target nucleic acids are NOT DETECTED. The SARS-CoV-2 RNA is generally detectable in upper respiratoy specimens during the acute phase of infection. The lowest concentration of SARS-CoV-2 viral copies this assay can detect is 131 copies/mL. A negative result does not preclude SARS-Cov-2 infection and should not be used as the sole basis for treatment or other patient management decisions. A negative result may occur with  improper specimen collection/handling, submission of specimen other than nasopharyngeal swab, presence of viral mutation(s) within the areas targeted by this assay, and inadequate number of viral copies (<131 copies/mL). A negative result must be combined with clinical observations, patient history, and epidemiological information. The expected result is Negative. Fact Sheet for Patients:  PinkCheek.be Fact Sheet for Healthcare Providers:  GravelBags.it This test is not yet ap proved or cleared by the [place] FDA and  has been authorized for detection and/or diagnosis of SARS-CoV-2 by FDA under an Emergency Use Authorization (EUA). This EUA will remain  in effect (meaning this test can be used) for the duration of the COVID-19 declaration under Section 564(b)(1) of the Act, 21 U.S.C. section 360bbb-3(b)(1), unless the authorization is terminated or revoked sooner.    Influenza A by PCR NEGATIVE NEGATIVE Final   Influenza B by PCR NEGATIVE NEGATIVE Final    Comment: (NOTE) The Xpert Xpress SARS-CoV-2/FLU/RSV assay is intended as an aid in   the diagnosis of influenza from Nasopharyngeal swab specimens and  should not be used as a sole basis for treatment. Nasal washings and  aspirates are unacceptable for Xpert Xpress SARS-CoV-2/FLU/RSV  testing. Fact Sheet for Patients: PinkCheek.be Fact Sheet for Healthcare Providers: GravelBags.it This  test is not yet approved or cleared by the [place] and  has been authorized for detection and/or diagnosis of SARS-CoV-2 by  FDA under an Emergency Use Authorization (EUA). This EUA will remain  in effect (meaning this test can be used) for the duration of the  Covid-19 declaration under Section 564(b)(1) of the Act, 21  U.S.C. section 360bbb-3(b)(1), unless the authorization is  terminated or revoked. Performed at [facility], [place] [address], [place], [place] [address]   Blood culture (routine x 2)     Status: None (Preliminary result)   Collection Time: 03/31/20  5:09 PM   Specimen: BLOOD RIGHT FOREARM  Result Value Ref Range Status   Specimen Description BLOOD RIGHT FOREARM  Final   Special Requests   Final    BOTTLES DRAWN AEROBIC AND ANAEROBIC Blood Culture adequate volume   Culture   Final    NO GROWTH 2 DAYS Performed at [facility], [place] [address] [place], [place] [address]    Report Status PENDING  Incomplete  Blood culture (routine x 2)     Status: None (Preliminary result)   Collection Time: 03/31/20  5:11 PM   Specimen: BLOOD  Result Value Ref Range Status   Specimen Description BLOOD LEFT ANTECUBITAL  Final   Special Requests   Final    BOTTLES DRAWN AEROBIC AND ANAEROBIC Blood Culture adequate volume   Culture   Final    NO GROWTH 2 DAYS Performed at [facility], [place] [address] [place], [place] [address]    Report Status PENDING  Incomplete  MRSA PCR Screening     Status: None   Collection Time: 03/31/20  6:41 PM   Specimen: Nasal Mucosa; Nasopharyngeal  Result Value  Ref Range Status   MRSA by PCR NEGATIVE NEGATIVE Final    Comment:        The GeneXpert MRSA Assay (FDA approved for NASAL specimens only), is one component of a comprehensive MRSA colonization surveillance program. It is not intended to diagnose MRSA infection nor to guide or monitor treatment for MRSA infections. Performed at [facility], [place] [address], [place], [place] [address]   Expectorated sputum assessment w rflx to resp cult     Status: None   Collection Time: 03/31/20 10:33 PM   Specimen: Sputum  Result Value Ref Range Status   Specimen Description SPUTUM  Final   Special Requests NONE  Final   Sputum evaluation   Final    THIS SPECIMEN IS ACCEPTABLE FOR SPUTUM CULTURE Performed at [facility], [place] [address] [place], [place] [address]    Report Status 04/01/2020 FINAL  Final  Culture, respiratory     Status: None (Preliminary result)   Collection Time: 03/31/20 10:33 PM   Specimen: SPU  Result Value Ref Range Status   Specimen Description SPUTUM  Final   Special Requests NONE Reflexed from [id number]  Final   Gram Stain   Final    RARE WBC PRESENT,BOTH PMN AND MONONUCLEAR FEW GRAM POSITIVE COCCI IN PAIRS FEW GRAM POSITIVE RODS RARE GRAM NEGATIVE RODS    Culture   Final    CULTURE REINCUBATED FOR BETTER GROWTH Performed at [facility], [place] [address] [place], [place] [address]    Report Status PENDING  Incomplete      Studies: No results found.  Scheduled Meds: . amLODipine  10 mg Oral Daily  . calcium acetate  1,334 mg Oral  TID WC  . Chlorhexidine Gluconate Cloth  6 each Topical Daily  . Chlorhexidine Gluconate Cloth  6 each Topical Q0600  . [START ON 04/03/2020] darbepoetin (ARANESP) injection - DIALYSIS  100 mcg Intravenous Q Wed-HD  . heparin  5,000 Units Subcutaneous Q8H  . hydrALAZINE  50 mg Oral Q8H  . nicotine  21 mg Transdermal Daily    Continuous Infusions:   LOS: 2 days     Alma Friendly, MD [facility]  If 7PM-7AM, please contact night-coverage [email or web address] 04/02/2020, 2:42 PM
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
## 2020-04-03 ENCOUNTER — Encounter (HOSPITAL_COMMUNITY): Payer: Self-pay | Admitting: Internal Medicine

## 2020-04-03 ENCOUNTER — Inpatient Hospital Stay (HOSPITAL_COMMUNITY): Payer: Medicaid Other

## 2020-04-03 HISTORY — PX: IR FLUORO GUIDE CV LINE RIGHT: IMG2283

## 2020-04-03 HISTORY — PX: IR US GUIDE VASC ACCESS RIGHT: IMG2390

## 2020-04-03 LAB — CBC WITH DIFFERENTIAL/PLATELET
Abs Immature Granulocytes: 0.06 10*3/uL (ref 0.00–0.07)
Basophils Absolute: 0 10*3/uL (ref 0.0–0.1)
Basophils Relative: 0 %
Eosinophils Absolute: 0.2 10*3/uL (ref 0.0–0.5)
Eosinophils Relative: 2 %
HCT: 21.8 % — ABNORMAL LOW (ref 39.0–52.0)
Hemoglobin: 7 g/dL — ABNORMAL LOW (ref 13.0–17.0)
Immature Granulocytes: 1 %
Lymphocytes Relative: 13 %
Lymphs Abs: 1.1 10*3/uL (ref 0.7–4.0)
MCH: 30.6 pg (ref 26.0–34.0)
MCHC: 32.1 g/dL (ref 30.0–36.0)
MCV: 95.2 fL (ref 80.0–100.0)
Monocytes Absolute: 0.9 10*3/uL (ref 0.1–1.0)
Monocytes Relative: 10 %
Neutro Abs: 6.7 10*3/uL (ref 1.7–7.7)
Neutrophils Relative %: 74 %
Platelets: 72 10*3/uL — ABNORMAL LOW (ref 150–400)
RBC: 2.29 MIL/uL — ABNORMAL LOW (ref 4.22–5.81)
RDW: 17 % — ABNORMAL HIGH (ref 11.5–15.5)
WBC: 9.1 10*3/uL (ref 4.0–10.5)
nRBC: 0 % (ref 0.0–0.2)

## 2020-04-03 LAB — GLUCOSE, CAPILLARY
Glucose-Capillary: 130 mg/dL — ABNORMAL HIGH (ref 70–99)
Glucose-Capillary: 101 mg/dL — ABNORMAL HIGH (ref 70–99)
Glucose-Capillary: 181 mg/dL — ABNORMAL HIGH (ref 70–99)
Glucose-Capillary: 104 mg/dL — ABNORMAL HIGH (ref 70–99)
Glucose-Capillary: 118 mg/dL — ABNORMAL HIGH (ref 70–99)

## 2020-04-03 LAB — RENAL FUNCTION PANEL
Albumin: 2.9 g/dL — ABNORMAL LOW (ref 3.5–5.0)
Anion gap: 15 (ref 5–15)
BUN: 90 mg/dL — ABNORMAL HIGH (ref 6–20)
CO2: 25 mmol/L (ref 22–32)
Calcium: 8.7 mg/dL — ABNORMAL LOW (ref 8.9–10.3)
Chloride: 95 mmol/L — ABNORMAL LOW (ref 98–111)
Creatinine, Ser: 14.68 mg/dL — ABNORMAL HIGH (ref 0.61–1.24)
GFR calc Af Amer: 4 mL/min — ABNORMAL LOW (ref >60)
GFR calc non Af Amer: 4 mL/min — ABNORMAL LOW (ref >60)
Glucose, Bld: 112 mg/dL — ABNORMAL HIGH (ref 70–99)
Phosphorus: 6.9 mg/dL — ABNORMAL HIGH (ref 2.5–4.6)
Potassium: 4.4 mmol/L (ref 3.5–5.1)
Sodium: 135 mmol/L (ref 135–145)

## 2020-04-03 LAB — CULTURE, RESPIRATORY W GRAM STAIN: Culture: NORMAL

## 2020-04-03 LAB — GLOMERULAR BASEMENT MEMBRANE ANTIBODIES: GBM Ab: 3 units (ref 0–20)

## 2020-04-03 IMAGING — XA IR FLUORO GUIDE CV LINE*R*
2 series · 2 of 2 positions shown · non-contrast
Comparison: none

INDICATION: 33-year-old male with a history of renal failure referred for
tunneled hemodialysis catheter

[Series 1: ir fluoro guide cv line*right* · 1 of 1 slices shown]
[im 1/1]
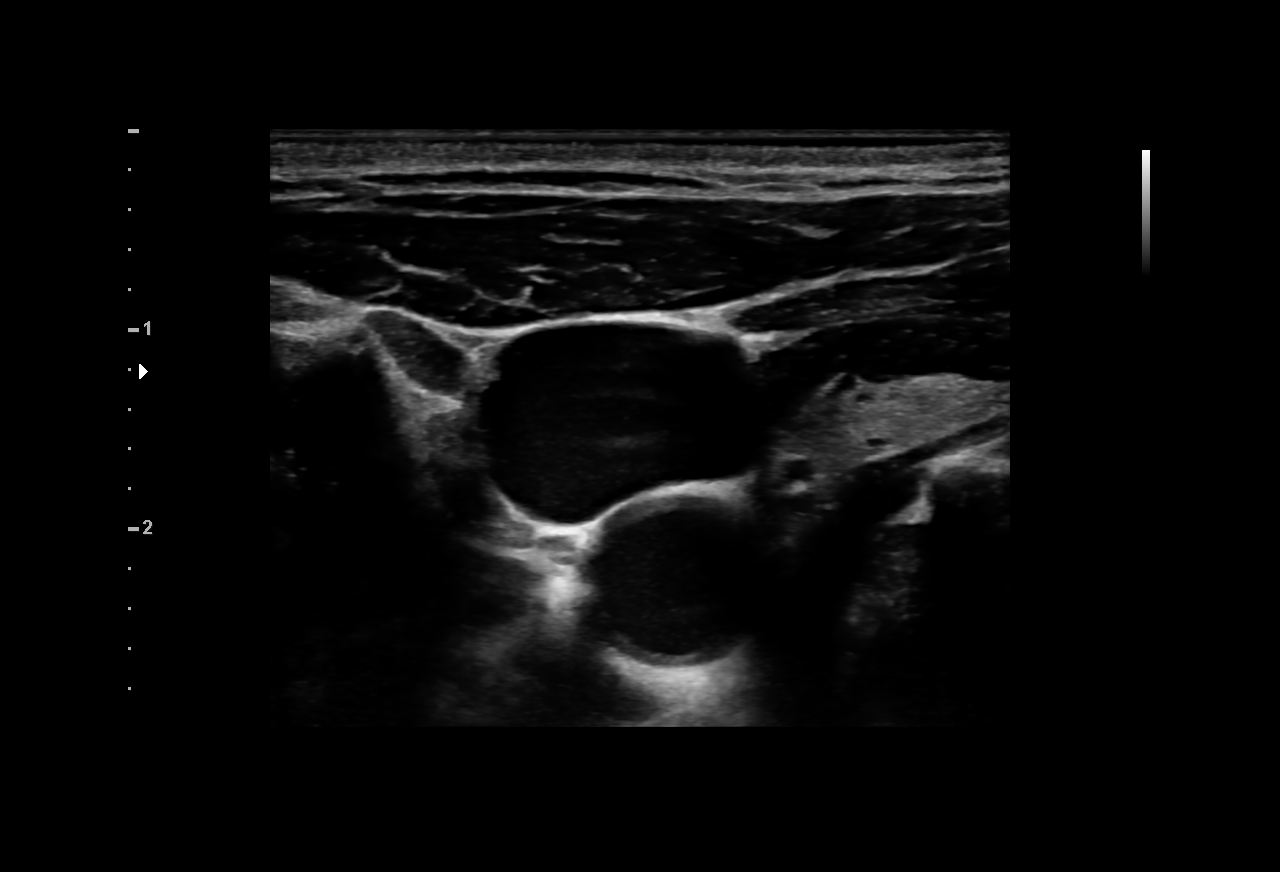

[Series 1: fl (-) angio · 1 of 1 slices shown]
[im 1/1]
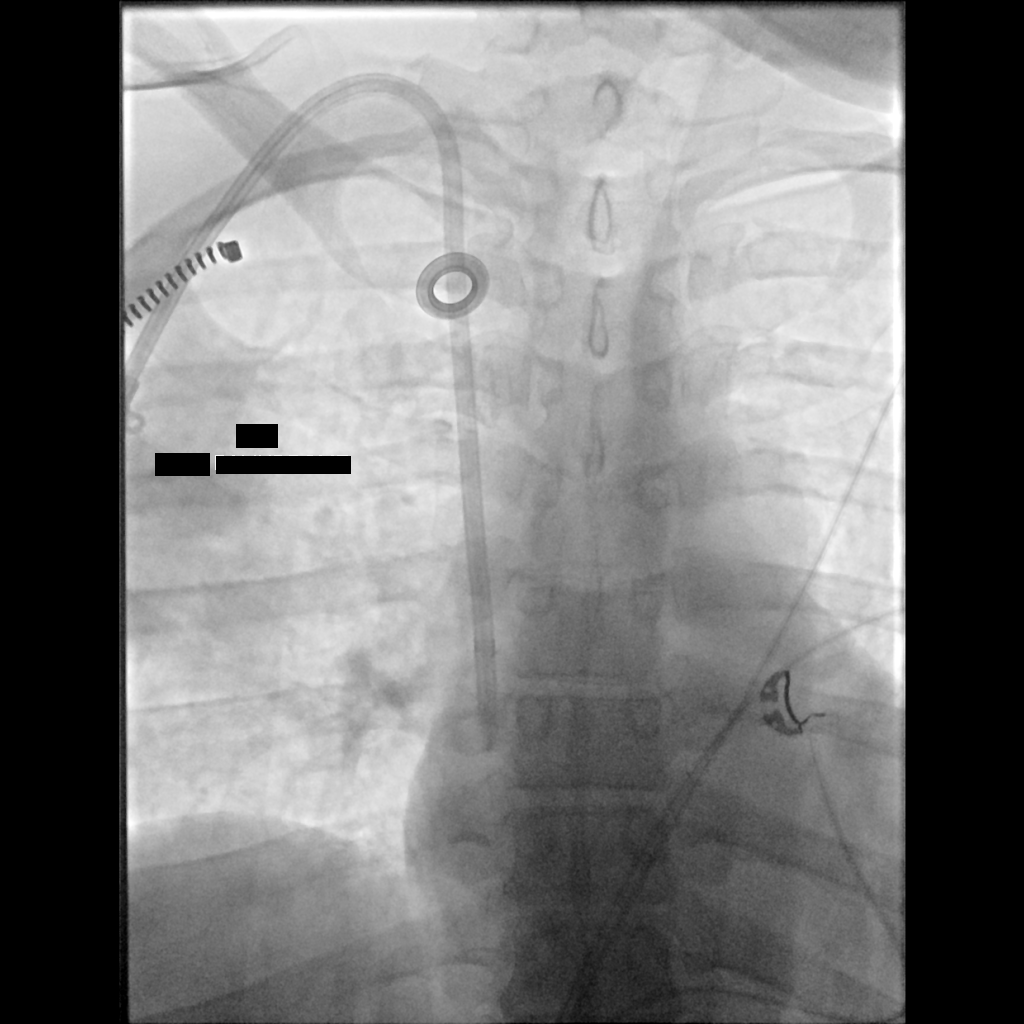

[2 of 2 positions shown; findings below may reference images not displayed]

EXAM:
TUNNELED CENTRAL VENOUS HEMODIALYSIS CATHETER PLACEMENT WITH
ULTRASOUND AND FLUOROSCOPIC GUIDANCE

MEDICATIONS:
2 g Ancef. The antibiotic was given in an appropriate time interval
prior to skin puncture.

ANESTHESIA/SEDATION:
A total of Versed 0 mg and Fentanyl 25 mcg was administered
intravenously.

No moderate sedation

FLUOROSCOPY TIME:  Fluoroscopy Time: 1 minutes 30 seconds (7 mGy).

COMPLICATIONS:
None



After creating a small venotomy incision, a micropuncture kit was
utilized to access the right internal jugular vein under direct,
real-time ultrasound guidance after the overlying soft tissues were
anesthetized with 1% lidocaine with epinephrine. Ultrasound image
documentation was performed. The microwire was marked to measure
appropriate internal catheter length. External tunneled length was
estimated. A total tip to cuff length of 23 cm was selected.

Skin and subcutaneous tissues of chest wall below the clavicle were
generously infiltrated with 1% lidocaine for local anesthesia. A
small stab incision was made with 11 blade scalpel. The selected
hemodialysis catheter was tunneled in a retrograde fashion from the
anterior chest wall to the venotomy incision.

A guidewire was advanced to the level of the IVC and the
micropuncture sheath was exchanged for a peel-away sheath.

The catheter was then placed through the peel-away sheath with tips
ultimately positioned within the superior aspect of the right
atrium. Final catheter positioning was confirmed and documented with
a spot radiographic image. The catheter aspirates and flushes
normally. The catheter was flushed with appropriate volume heparin
dwells.

The catheter exit site was secured with a 0-Prolene retention
suture. The venotomy incision was closed Derma bond and sterile
dressing. Dressings were applied at the chest wall.

Patient tolerated the procedure well and remained hemodynamically
stable throughout.

No complications were encountered and no significant blood loss
encountered.
IMPRESSION: Status post right IJ tunneled hemodialysis catheter placement.
Catheter ready for use.

## 2020-04-03 MED ORDER — HEPARIN SODIUM (PORCINE) 1000 UNIT/ML DIALYSIS: 1000.0000 [IU] | INTRAMUSCULAR | Status: DC | PRN

## 2020-04-03 MED ORDER — LIDOCAINE-PRILOCAINE 2.5-2.5 % EX CREA: 1.0000 "application " | TOPICAL_CREAM | CUTANEOUS | Status: DC | PRN

## 2020-04-03 MED ORDER — HYDRALAZINE HCL 50 MG PO TABS
100.0000 mg | ORAL_TABLET | Freq: Three times a day (TID) | ORAL | Status: DC
  Administered 2020-04-03 – 2020-04-05 (×7): 100 mg via ORAL
  Filled 2020-04-03 (×7): qty 2

## 2020-04-03 MED ORDER — LIDOCAINE HCL (PF) 1 % IJ SOLN
INTRAMUSCULAR | Status: AC | PRN
  Administered 2020-04-03: 10 mL

## 2020-04-03 MED ORDER — GELATIN ABSORBABLE 12-7 MM EX MISC
CUTANEOUS | Status: AC
  Administered 2020-04-03: 1
  Filled 2020-04-03: qty 1

## 2020-04-03 MED ORDER — CEFAZOLIN SODIUM-DEXTROSE 2-4 GM/100ML-% IV SOLN
INTRAVENOUS | Status: AC
  Filled 2020-04-03: qty 100

## 2020-04-03 MED ORDER — CALCITRIOL 0.5 MCG PO CAPS
0.5000 ug | ORAL_CAPSULE | ORAL | Status: DC
  Administered 2020-04-03 – 2020-04-05 (×2): 0.5 ug via ORAL
  Filled 2020-04-03 (×2): qty 1

## 2020-04-03 MED ORDER — HEPARIN SODIUM (PORCINE) 5000 UNIT/ML IJ SOLN
5000.0000 [IU] | Freq: Three times a day (TID) | INTRAMUSCULAR | Status: DC
  Administered 2020-04-04 – 2020-04-05 (×4): 5000 [IU] via SUBCUTANEOUS
  Filled 2020-04-03 (×7): qty 1

## 2020-04-03 MED ORDER — ALTEPLASE 2 MG IJ SOLR: 2.0000 mg | Freq: Once | INTRAMUSCULAR | Status: DC | PRN

## 2020-04-03 MED ORDER — FENTANYL CITRATE (PF) 100 MCG/2ML IJ SOLN
INTRAMUSCULAR | Status: AC | PRN
  Administered 2020-04-03: 25 ug via INTRAVENOUS

## 2020-04-03 MED ORDER — FENTANYL CITRATE (PF) 100 MCG/2ML IJ SOLN
INTRAMUSCULAR | Status: AC
  Filled 2020-04-03: qty 2

## 2020-04-03 MED ORDER — DARBEPOETIN ALFA 100 MCG/0.5ML IJ SOSY
PREFILLED_SYRINGE | INTRAMUSCULAR | Status: AC
  Filled 2020-04-03: qty 0.5

## 2020-04-03 MED ORDER — CEFAZOLIN SODIUM-DEXTROSE 2-4 GM/100ML-% IV SOLN
2.0000 g | INTRAVENOUS | Status: AC
  Administered 2020-04-03: 2 g via INTRAVENOUS

## 2020-04-03 MED ORDER — SODIUM CHLORIDE 0.9 % IV SOLN: 100.0000 mL | INTRAVENOUS | Status: DC | PRN

## 2020-04-03 MED ORDER — HEPARIN SODIUM (PORCINE) 1000 UNIT/ML IJ SOLN
INTRAMUSCULAR | Status: AC
  Administered 2020-04-03: 3800 [IU]
  Filled 2020-04-03: qty 1

## 2020-04-03 MED ORDER — CEFAZOLIN (ANCEF) 1 G IV SOLR: INTRAVENOUS | Status: AC | PRN

## 2020-04-03 MED ORDER — PENTAFLUOROPROP-TETRAFLUOROETH EX AERO: 1.0000 "application " | INHALATION_SPRAY | CUTANEOUS | Status: DC | PRN

## 2020-04-03 MED ORDER — GUAIFENESIN-DM 100-10 MG/5ML PO SYRP
5.0000 mL | ORAL_SOLUTION | ORAL | Status: DC | PRN
  Administered 2020-04-03: 5 mL via ORAL
  Filled 2020-04-03: qty 5

## 2020-04-03 MED ORDER — LIDOCAINE HCL (PF) 1 % IJ SOLN: 5.0000 mL | INTRAMUSCULAR | Status: DC | PRN

## 2020-04-03 MED ORDER — HEPARIN SODIUM (PORCINE) 1000 UNIT/ML IJ SOLN
INTRAMUSCULAR | Status: AC
  Administered 2020-04-03: 3200 [IU] via INTRAVENOUS_CENTRAL
  Filled 2020-04-03: qty 4

## 2020-04-03 MED ORDER — LIDOCAINE HCL 1 % IJ SOLN
INTRAMUSCULAR | Status: AC
  Filled 2020-04-03: qty 20

## 2020-04-03 NOTE — Progress Notes (Signed)
LATE ENTRY: Charge RN notified by HD that patient had belongings in bed (a knife, small glass spray bottle, matches and a bottle of IBU).  Spoke with Kelvin Cellar, RN (department director) instructed to give belongings to patient's mother.  Items were given to mother she was instructed to take them to the car.
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
## 2020-04-03 NOTE — Progress Notes (Signed)
Pt.woke up & wanted to leave stating that he read  his text messages on his cell phone that he is being charged $750 /day from staying in the hospital & so he wanted to go to [facility]. Called Security for help bec he insisted to leave Mother & step dad came to the room & tried to talk to  Him but he insisted to leave .Security arrived &  Also tried to talk to him but he was  Verbally aggressive & impulsive .MD on call Baltazar Najjar  Who is on the floor was notified & she spoke to the Mom & step dad . & she ordered Haldol po.Pt. finally went to his room & agreed to take the haldol after he speaks to the doctor.NP Santiago Glad went to the room & spoke to pt.& he took his pill .
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
## 2020-04-03 NOTE — Procedures (Signed)
Interventional Radiology Procedure Note  Procedure: Placement of a right IJ approach tunneled HD catheter, 23cm tip to cuff.  Tip is positioned at the superior cavoatrial junction and catheter is ready for immediate use.  Complications: None Recommendations:  - Ok to use - Do not submerge - Routine line care   Signed,  Dulcy Fanny. Earleen Newport, DO

## 2020-04-03 NOTE — Consult Note (Signed)
[facility] Consult   Reason for Consult:  "Please kindly evaluate for decision making capacity. 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 Hx of TBI, insisting on leaving AMA. Patient has very poor insight to his medical condition. Ok to IVC him??" Referring Physician:  Dr. Verlon Au Patient Identification: Douglas [name] MRN:  [id number] Principal Diagnosis: <principal problem not specified> Diagnosis:  Active Problems:   Acute renal failure (ARF) ([place])   Malignant hypertension   Total Time spent with patient: 30 minutes  Subjective:   Douglas [name] is a 34 y.o. male patient.  Patient assessed by nurse practitioner.  Patient alert and oriented.  Patient appears reluctant to participate in interview, patient's eyes remain closed multiple times during assessment.  Patient gives verbal consent to speak with his mother, Douglas [name], at bedside for collateral information. Patient denies suicidal and homicidal ideations.  Patient denies history of suicidal behavior, denies history of self-harm.  Patient denies auditory and visual hallucinations.  Patient denies symptoms of paranoia. Patient reports "I was not trying to leave last night, I just wanted to go to [facility] for a second opinion."  Patient verbalizes intent to remain at [facility] until discharged once medically cleared. Patient lives with mother.  Patient denies access to weapons.  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 Patient's mother, Douglas [name], denies concerns for patient safety from active suicidal standpoint.  Patient's mother reports concerns that patient is not compliant with antihypertensive medications when at home.  Patient's mother also concerned with patient's history of sleep apnea and inability to use CPAP related to ear pain.  HPI: Patient admitted with hypertensive emergency.  Past Psychiatric History: Traumatic brain injury per mother  Risk to Self:  Denies Risk to Others:   Denies Prior Inpatient Therapy:  Denies Prior Outpatient Therapy:  Denies  Past Medical History:  Past Medical History:  Diagnosis Date  . Asthma   . Brain injury ([place])   . COPD (chronic obstructive pulmonary disease) ([place])   . Hypertension   . Sleep apnea     Past Surgical History:  Procedure Laterality Date  . MYRINGOTOMY     Family History: History reviewed. No pertinent family history. Family Psychiatric  History: Mother-anxiety, depression Social History:  Social History   Substance and Sexual Activity  Alcohol Use Yes     Social History   Substance and Sexual Activity  Drug Use Yes  . Types: Marijuana    Social History   Socioeconomic History  . Marital status: Single    Spouse name: Not on file  . Number of children: Not on file  . Years of education: Not on file  . Highest education level: Not on file  Occupational History  . Not on file  Tobacco Use  . Smoking status: Current Every Day Smoker    Types: Cigarettes  Substance and Sexual Activity  . Alcohol use: Yes  . Drug use: Yes    Types: Marijuana  . Sexual activity: Not on file  Other Topics Concern  . Not on file  Social History Narrative  . Not on file   Social Determinants of Health   Financial Resource Strain:   . Difficulty of Paying Living Expenses:   Food Insecurity:   . Worried About Charity fundraiser in the Last Year:   . Arboriculturist in the Last Year:   Transportation Needs:   . Film/video editor (Medical):   Marland Kitchen Lack of Transportation (Non-Medical):  Physical Activity:   . Days of Exercise per Week:   . Minutes of Exercise per Session:   Stress:   . Feeling of Stress :   Social Connections:   . Frequency of Communication with Friends and Family:   . Frequency of Social Gatherings with Friends and Family:   . Attends Religious Services:   . Active Member of Clubs or Organizations:   . Attends Archivist Meetings:   Marland Kitchen Marital Status:    Additional  Social History:    Allergies:   Allergies  Allergen Reactions  . Pork-Derived Products Nausea And Vomiting    Labs:  Results for orders placed or performed during the hospital encounter of 03/31/20 (from the past 48 hour(s))  Glucose, capillary     Status: Abnormal   Collection Time: 04/01/20 11:42 AM  Result Value Ref Range   Glucose-Capillary 123 (H) 70 - 99 mg/dL    Comment: Glucose reference range applies only to samples taken after fasting for at least 8 hours.  Glucose, capillary     Status: Abnormal   Collection Time: 04/01/20  9:36 PM  Result Value Ref Range   Glucose-Capillary 102 (H) 70 - 99 mg/dL    Comment: Glucose reference range applies only to samples taken after fasting for at least 8 hours.  Glucose, capillary     Status: Abnormal   Collection Time: 04/02/20 12:02 AM  Result Value Ref Range   Glucose-Capillary 117 (H) 70 - 99 mg/dL    Comment: Glucose reference range applies only to samples taken after fasting for at least 8 hours.  Iron and TIBC     Status: Abnormal   Collection Time: 04/02/20  3:47 AM  Result Value Ref Range   Iron 76 45 - 182 ug/dL   TIBC 248 (L) 250 - 450 ug/dL   Saturation Ratios 31 17.9 - 39.5 %   UIBC 172 ug/dL    Comment: Performed at [facility] [address] [place], [place] [address]  CBC     Status: Abnormal   Collection Time: 04/02/20  3:47 AM  Result Value Ref Range   WBC 9.4 4.0 - 10.5 K/uL   RBC 2.67 (L) 4.22 - 5.81 MIL/uL   Hemoglobin 8.0 (L) 13.0 - 17.0 g/dL   HCT 24.3 (L) 39.0 - 52.0 %   MCV 91.0 80.0 - 100.0 fL   MCH 30.0 26.0 - 34.0 pg   MCHC 32.9 30.0 - 36.0 g/dL   RDW 16.6 (H) 11.5 - 15.5 %   Platelets 81 (L) 150 - 400 K/uL    Comment: REPEATED TO VERIFY   nRBC 0.0 0.0 - 0.2 %    Comment: Performed at [facility] [address] [place], [place] [address]  Renal function panel     Status: Abnormal   Collection Time: 04/02/20  3:47 AM  Result Value Ref Range   Sodium 133 (L) 135 - 145 mmol/L    Potassium 4.5 3.5 - 5.1 mmol/L   Chloride 90 (L) 98 - 111 mmol/L   CO2 22 22 - 32 mmol/L   Glucose, Bld 104 (H) 70 - 99 mg/dL    Comment: Glucose reference range applies only to samples taken after fasting for at least 8 hours.   BUN 118 (H) 6 - 20 mg/dL   Creatinine, Ser 18.12 (H) 0.61 - 1.24 mg/dL   Calcium 9.1 8.9 - 10.3 mg/dL   Phosphorus 8.6 (H) 2.5 - 4.6 mg/dL  Albumin 3.2 (L) 3.5 - 5.0 g/dL   GFR calc non Af Amer 3 (L) >60 mL/min   GFR calc Af Amer 3 (L) >60 mL/min   Anion gap 21 (H) 5 - 15    Comment: Performed at [facility] [address] [place], [place] [address]  Glucose, capillary     Status: None   Collection Time: 04/02/20  4:07 AM  Result Value Ref Range   Glucose-Capillary 96 70 - 99 mg/dL    Comment: Glucose reference range applies only to samples taken after fasting for at least 8 hours.  Glucose, capillary     Status: Abnormal   Collection Time: 04/02/20 10:51 AM  Result Value Ref Range   Glucose-Capillary 108 (H) 70 - 99 mg/dL    Comment: Glucose reference range applies only to samples taken after fasting for at least 8 hours.  Glucose, capillary     Status: Abnormal   Collection Time: 04/02/20 12:04 PM  Result Value Ref Range   Glucose-Capillary 118 (H) 70 - 99 mg/dL    Comment: Glucose reference range applies only to samples taken after fasting for at least 8 hours.  Renal function panel     Status: Abnormal   Collection Time: 04/02/20  4:24 PM  Result Value Ref Range   Sodium 137 135 - 145 mmol/L   Potassium 4.6 3.5 - 5.1 mmol/L   Chloride 95 (L) 98 - 111 mmol/L   CO2 26 22 - 32 mmol/L   Glucose, Bld 113 (H) 70 - 99 mg/dL    Comment: Glucose reference range applies only to samples taken after fasting for at least 8 hours.   BUN 73 (H) 6 - 20 mg/dL   Creatinine, Ser 12.68 (H) 0.61 - 1.24 mg/dL   Calcium 9.2 8.9 - 10.3 mg/dL   Phosphorus 6.0 (H) 2.5 - 4.6 mg/dL   Albumin 3.3 (L) 3.5 - 5.0 g/dL   GFR calc non Af Amer 5 (L) >60 mL/min    GFR calc Af Amer 5 (L) >60 mL/min   Anion gap 16 (H) 5 - 15    Comment: Performed at [facility] [address] [place], [place] [address]  Glucose, capillary     Status: Abnormal   Collection Time: 04/02/20  4:57 PM  Result Value Ref Range   Glucose-Capillary 101 (H) 70 - 99 mg/dL    Comment: Glucose reference range applies only to samples taken after fasting for at least 8 hours.  Glucose, capillary     Status: Abnormal   Collection Time: 04/02/20  8:08 PM  Result Value Ref Range   Glucose-Capillary 144 (H) 70 - 99 mg/dL    Comment: Glucose reference range applies only to samples taken after fasting for at least 8 hours.  Glucose, capillary     Status: Abnormal   Collection Time: 04/03/20  3:42 AM  Result Value Ref Range   Glucose-Capillary 130 (H) 70 - 99 mg/dL    Comment: Glucose reference range applies only to samples taken after fasting for at least 8 hours.  CBC with Differential/Platelet     Status: Abnormal   Collection Time: 04/03/20  3:57 AM  Result Value Ref Range   WBC 9.1 4.0 - 10.5 K/uL   RBC 2.29 (L) 4.22 - 5.81 MIL/uL   Hemoglobin 7.0 (L) 13.0 - 17.0 g/dL   HCT 21.8 (L) 39.0 - 52.0 %   MCV 95.2 80.0 - 100.0 fL   MCH 30.6 26.0 -  34.0 pg   MCHC 32.1 30.0 - 36.0 g/dL   RDW 17.0 (H) 11.5 - 15.5 %   Platelets 72 (L) 150 - 400 K/uL    Comment: REPEATED TO VERIFY Immature Platelet Fraction may be clinically indicated, consider ordering this additional test [id number] CONSISTENT WITH PREVIOUS RESULT    nRBC 0.0 0.0 - 0.2 %   Neutrophils Relative % 74 %   Neutro Abs 6.7 1.7 - 7.7 K/uL   Lymphocytes Relative 13 %   Lymphs Abs 1.1 0.7 - 4.0 K/uL   Monocytes Relative 10 %   Monocytes Absolute 0.9 0.1 - 1.0 K/uL   Eosinophils Relative 2 %   Eosinophils Absolute 0.2 0.0 - 0.5 K/uL   Basophils Relative 0 %   Basophils Absolute 0.0 0.0 - 0.1 K/uL   Immature Granulocytes 1 %   Abs Immature Granulocytes 0.06 0.00 - 0.07 K/uL    Comment: Performed at [facility] [address], [place], [place] [address]  Renal function panel     Status: Abnormal   Collection Time: 04/03/20  7:20 AM  Result Value Ref Range   Sodium 135 135 - 145 mmol/L   Potassium 4.4 3.5 - 5.1 mmol/L   Chloride 95 (L) 98 - 111 mmol/L   CO2 25 22 - 32 mmol/L   Glucose, Bld 112 (H) 70 - 99 mg/dL    Comment: Glucose reference range applies only to samples taken after fasting for at least 8 hours.   BUN 90 (H) 6 - 20 mg/dL   Creatinine, Ser 14.68 (H) 0.61 - 1.24 mg/dL   Calcium 8.7 (L) 8.9 - 10.3 mg/dL   Phosphorus 6.9 (H) 2.5 - 4.6 mg/dL   Albumin 2.9 (L) 3.5 - 5.0 g/dL   GFR calc non Af Amer 4 (L) >60 mL/min   GFR calc Af Amer 4 (L) >60 mL/min   Anion gap 15 5 - 15    Comment: Performed at [facility] [address] [place], [place] [address]    Current Facility-Administered Medications  Medication Dose Route Frequency Provider Last Rate Last Admin  . 0.9 %  sodium chloride infusion  100 mL Intravenous PRN Rosita Fire, MD      . 0.9 %  sodium chloride infusion  100 mL Intravenous PRN Rosita Fire, MD      . acetaminophen (TYLENOL) tablet 650 mg  650 mg Oral Q4H PRN Helberg, Justin, MD      . alteplase (CATHFLO ACTIVASE) injection 2 mg  2 mg Intracatheter Once PRN Rosita Fire, MD      . amLODipine (NORVASC) tablet 10 mg  10 mg Oral Daily Spero Geralds, MD   10 mg at 04/02/20 1526  . calcium acetate (PHOSLO) capsule 1,334 mg  1,334 mg Oral TID WC Rosita Fire, MD   1,334 mg at 04/02/20 1520  . ceFAZolin (ANCEF) IVPB 2g/100 mL premix  2 g Intravenous to Edward Jolly, PA-C      . Chlorhexidine Gluconate Cloth 2 % PADS 6 each  6 each Topical Daily Spero Geralds, MD   6 each at 04/01/20 1000  . Chlorhexidine Gluconate Cloth 2 % PADS 6 each  6 each Topical Q0600 Roney Jaffe, MD   6 each at 04/03/20 [phone]  . Darbepoetin Alfa (ARANESP) injection 100 mcg  100 mcg Intravenous Q Wed-HD Rosita Fire, MD   100  mcg at 04/03/20 0902  . guaiFENesin-dextromethorphan (ROBITUSSIN DM) 100-10  MG/5ML syrup 5 mL  5 mL Oral Q4H PRN Gardiner Barefoot, NP   5 mL at 04/03/20 0340  . haloperidol (HALDOL) tablet 5 mg  5 mg Oral Q6H PRN Gardiner Barefoot, NP   5 mg at 04/02/20 2200  . heparin injection 1,000 Units  1,000 Units Dialysis PRN Rosita Fire, MD   3,200 Units at 04/03/20 0934  . [START ON 04/04/2020] heparin injection 5,000 Units  5,000 Units Subcutaneous Q8H Monia Sabal, PA-C      . hydrALAZINE (APRESOLINE) tablet 50 mg  50 mg Oral Q8H Spero Geralds, MD   50 mg at 04/03/20 0336  . labetalol (NORMODYNE) injection 10-20 mg  10-20 mg Intravenous Q2H PRN Spero Geralds, MD   20 mg at 04/02/20 0517  . lidocaine (PF) (XYLOCAINE) 1 % injection 5 mL  5 mL Intradermal PRN Rosita Fire, MD      . lidocaine-prilocaine (EMLA) cream 1 application  1 application Topical PRN Rosita Fire, MD      . LORazepam (ATIVAN) injection 2 mg  2 mg Intramuscular Q6H PRN Kirby-Graham, Karsten Fells, NP       Or  . LORazepam (ATIVAN) tablet 2 mg  2 mg Oral Q6H PRN Gardiner Barefoot, NP   2 mg at 04/03/20 0336  . nicotine (NICODERM CQ - dosed in mg/24 hours) patch 21 mg  21 mg Transdermal Daily Judd Lien, MD   Stopped at 04/01/20 [phone]  . ondansetron (ZOFRAN) injection 4 mg  4 mg Intravenous Q6H PRN Ina Homes, MD      . pentafluoroprop-tetrafluoroeth (GEBAUERS) aerosol 1 application  1 application Topical PRN Rosita Fire, MD        Musculoskeletal: Strength & Muscle Tone: within normal limits Gait & Station: normal Patient leans: N/A  Psychiatric Specialty Exam: Physical Exam  Nursing note and vitals reviewed. Constitutional: He is oriented to person, place, and time. He appears well-developed.  HENT:  Head: Normocephalic.  Cardiovascular: Normal rate.  Respiratory: Effort normal.  Musculoskeletal:        General: Normal range of motion.     Cervical back: Normal  range of motion.  Neurological: He is alert and oriented to person, place, and time.  Psychiatric: He has a normal mood and affect. His speech is normal and behavior is normal. Judgment and thought content normal. Cognition and memory are normal.    Review of Systems  Constitutional: Negative.   HENT: Negative.   Eyes: Negative.   Respiratory: Negative.   Cardiovascular: Negative.   Gastrointestinal: Negative.   Genitourinary: Negative.   Musculoskeletal: Negative.   Skin: Negative.   Neurological: Negative.     Blood pressure (!) 223/124, pulse (!) 111, temperature 98.4 F (36.9 C), temperature source Oral, resp. rate 19, height 6\' 5"  (1.956 m), weight 103.8 kg, SpO2 92 %.Body mass index is 27.14 kg/m.  General Appearance: Casual    Eye Contact:  Fair  Speech:  Clear and Coherent and Normal Rate  Volume:  Normal  Mood:  Euthymic  Affect:  Congruent  Thought Process:  Coherent, Goal Directed and Descriptions of Associations: Intact  Orientation:  Full (Time, Place, and Person)  Thought Content:  WDL and Logical  Suicidal Thoughts:  No  Homicidal Thoughts:  No  Memory:  Immediate;   Good Recent;   Good Remote;   Good  Judgement:  Good  Insight:  Good  Psychomotor Activity:  Normal  Concentration:  Concentration: Good and Attention  Span: Good  Recall:  Good  Fund of Knowledge:  Good  Language:  Good  Akathisia:  No  Handed:  Right  AIMS (if indicated):     Assets:  Communication Skills Desire for Improvement Financial Resources/Insurance Housing Intimacy Leisure Time Resilience Social Support Talents/Skills Vocational/Educational  ADL's:  Intact  Cognition:  WNL  Sleep:        Treatment Plan Summary: Patient discussed with Dr. Dwyane Dee. Plan Regarding capacity:  1.  Patient is oriented and alert, patient does appear to have understanding of current situation. 2.  Patient does understand his current condition and illness, patient able to describe reasons for  inpatient admission at this time. 3. Patient does understand the risk of refusing treatment, patient is capable of discussing risk involved in discontinuing treatment.   Disposition: No evidence of imminent risk to self or others at present.   Patient does not meet criteria for psychiatric inpatient admission. Supportive therapy provided about ongoing stressors.  Emmaline Kluver, FNP 04/03/2020 10:47 AM

## 2020-04-04 ENCOUNTER — Encounter (HOSPITAL_COMMUNITY): Payer: Self-pay

## 2020-04-04 ENCOUNTER — Encounter (HOSPITAL_COMMUNITY): Payer: Self-pay | Admitting: Internal Medicine

## 2020-04-04 LAB — CBC WITH DIFFERENTIAL/PLATELET
Abs Immature Granulocytes: 0.14 10*3/uL — ABNORMAL HIGH (ref 0.00–0.07)
Basophils Absolute: 0 10*3/uL (ref 0.0–0.1)
Basophils Relative: 0 %
Eosinophils Absolute: 0.4 10*3/uL (ref 0.0–0.5)
Eosinophils Relative: 4 %
HCT: 23 % — ABNORMAL LOW (ref 39.0–52.0)
Hemoglobin: 7.2 g/dL — ABNORMAL LOW (ref 13.0–17.0)
Immature Granulocytes: 1 %
Lymphocytes Relative: 9 %
Lymphs Abs: 0.9 10*3/uL (ref 0.7–4.0)
MCH: 30.5 pg (ref 26.0–34.0)
MCHC: 31.3 g/dL (ref 30.0–36.0)
MCV: 97.5 fL (ref 80.0–100.0)
Monocytes Absolute: 1.1 10*3/uL — ABNORMAL HIGH (ref 0.1–1.0)
Monocytes Relative: 10 %
Neutro Abs: 7.7 10*3/uL (ref 1.7–7.7)
Neutrophils Relative %: 76 %
Platelets: 80 10*3/uL — ABNORMAL LOW (ref 150–400)
RBC: 2.36 MIL/uL — ABNORMAL LOW (ref 4.22–5.81)
RDW: 17.2 % — ABNORMAL HIGH (ref 11.5–15.5)
WBC: 10.2 10*3/uL (ref 4.0–10.5)
nRBC: 0 % (ref 0.0–0.2)

## 2020-04-04 LAB — GLUCOSE, CAPILLARY
Glucose-Capillary: 127 mg/dL — ABNORMAL HIGH (ref 70–99)
Glucose-Capillary: 118 mg/dL — ABNORMAL HIGH (ref 70–99)
Glucose-Capillary: 110 mg/dL — ABNORMAL HIGH (ref 70–99)
Glucose-Capillary: 128 mg/dL — ABNORMAL HIGH (ref 70–99)
Glucose-Capillary: 109 mg/dL — ABNORMAL HIGH (ref 70–99)
Glucose-Capillary: 100 mg/dL — ABNORMAL HIGH (ref 70–99)

## 2020-04-04 MED ORDER — CHLORHEXIDINE GLUCONATE CLOTH 2 % EX PADS
6.0000 | MEDICATED_PAD | Freq: Every day | CUTANEOUS | Status: DC
  Administered 2020-04-04: 6 via TOPICAL

## 2020-04-04 MED ORDER — METOPROLOL TARTRATE 12.5 MG HALF TABLET
12.5000 mg | ORAL_TABLET | Freq: Two times a day (BID) | ORAL | Status: DC
  Administered 2020-04-04 – 2020-04-05 (×3): 12.5 mg via ORAL
  Filled 2020-04-04 (×3): qty 1

## 2020-04-04 MED ORDER — LORAZEPAM 0.5 MG PO TABS
0.5000 mg | ORAL_TABLET | Freq: Four times a day (QID) | ORAL | Status: DC | PRN
  Administered 2020-04-04 – 2020-04-05 (×2): 0.5 mg via ORAL
  Filled 2020-04-04 (×2): qty 1

## 2020-04-04 NOTE — Progress Notes (Signed)
Order placed to remove left femoral triple lumen non-tunneled cath per Verlon Au, MD. Nephrology also made aware.

## 2020-04-04 NOTE — Progress Notes (Signed)
Femoral line removal. Manual pressure held x15 minutes. Site unremarkable. Pt instructed to remain flat for additional :18. Pt's mother at bedside. Instructions given to call RN if any bleeding is noted at site. Keep dressing clean dry and intact for 24 hours.

## 2020-04-04 NOTE — TOC Progression Note (Addendum)
Transition of Care [facility]) - Progression Note    Patient Details  Name: Douglas [name] MRN: [id number] Date of Birth: Feb 21, 1986  Transition of Care [facility]) CM/SW [place], [place] Phone Number: 04/04/2020, 4:36 PM  Clinical Narrative:     CSW aware patient cleared by psychiatry. CSW spoke with Dr. Verlon Au who will sign patient's notice of commitment change form in the morning. Patient still under IVC until this paperwork is completed.  CSW will continue to follow.         Expected Discharge Plan and Services                                                 Social Determinants of Health (SDOH) Interventions    Readmission Risk Interventions No flowsheet data found.

## 2020-04-04 NOTE — Progress Notes (Signed)
Pt stated he would take his medications if he could walk to [employer] to get his own lunch. Pt was escorted by NT and pt's mom. Verlon Au, MD made aware.
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
## 2020-04-05 ENCOUNTER — Encounter (HOSPITAL_COMMUNITY): Payer: Self-pay | Admitting: Internal Medicine

## 2020-04-05 ENCOUNTER — Inpatient Hospital Stay (HOSPITAL_COMMUNITY): Payer: Medicaid Other

## 2020-04-05 DIAGNOSIS — N185 Chronic kidney disease, stage 5: Secondary | ICD-10-CM

## 2020-04-05 LAB — CBC
HCT: 23.2 % — ABNORMAL LOW (ref 39.0–52.0)
Hemoglobin: 7.1 g/dL — ABNORMAL LOW (ref 13.0–17.0)
MCH: 30 pg (ref 26.0–34.0)
MCHC: 30.6 g/dL (ref 30.0–36.0)
MCV: 97.9 fL (ref 80.0–100.0)
Platelets: 100 10*3/uL — ABNORMAL LOW (ref 150–400)
RBC: 2.37 MIL/uL — ABNORMAL LOW (ref 4.22–5.81)
RDW: 16.8 % — ABNORMAL HIGH (ref 11.5–15.5)
WBC: 11.9 10*3/uL — ABNORMAL HIGH (ref 4.0–10.5)
nRBC: 0 % (ref 0.0–0.2)

## 2020-04-05 LAB — RENAL FUNCTION PANEL
Albumin: 3 g/dL — ABNORMAL LOW (ref 3.5–5.0)
Anion gap: 17 — ABNORMAL HIGH (ref 5–15)
BUN: 82 mg/dL — ABNORMAL HIGH (ref 6–20)
CO2: 21 mmol/L — ABNORMAL LOW (ref 22–32)
Calcium: 8.9 mg/dL (ref 8.9–10.3)
Chloride: 94 mmol/L — ABNORMAL LOW (ref 98–111)
Creatinine, Ser: 14.79 mg/dL — ABNORMAL HIGH (ref 0.61–1.24)
GFR calc Af Amer: 4 mL/min — ABNORMAL LOW (ref >60)
GFR calc non Af Amer: 4 mL/min — ABNORMAL LOW (ref >60)
Glucose, Bld: 103 mg/dL — ABNORMAL HIGH (ref 70–99)
Phosphorus: 5.9 mg/dL — ABNORMAL HIGH (ref 2.5–4.6)
Potassium: 4.7 mmol/L (ref 3.5–5.1)
Sodium: 132 mmol/L — ABNORMAL LOW (ref 135–145)

## 2020-04-05 LAB — CULTURE, BLOOD (ROUTINE X 2)
Culture: NO GROWTH
Culture: NO GROWTH
Special Requests: ADEQUATE
Special Requests: ADEQUATE

## 2020-04-05 LAB — CBC WITH DIFFERENTIAL/PLATELET
Abs Immature Granulocytes: 0.07 10*3/uL (ref 0.00–0.07)
Basophils Absolute: 0.1 10*3/uL (ref 0.0–0.1)
Basophils Relative: 1 %
Eosinophils Absolute: 0.5 10*3/uL (ref 0.0–0.5)
Eosinophils Relative: 5 %
HCT: 23.9 % — ABNORMAL LOW (ref 39.0–52.0)
Hemoglobin: 7.5 g/dL — ABNORMAL LOW (ref 13.0–17.0)
Immature Granulocytes: 1 %
Lymphocytes Relative: 12 %
Lymphs Abs: 1.1 10*3/uL (ref 0.7–4.0)
MCH: 30.2 pg (ref 26.0–34.0)
MCHC: 31.4 g/dL (ref 30.0–36.0)
MCV: 96.4 fL (ref 80.0–100.0)
Monocytes Absolute: 1.1 10*3/uL — ABNORMAL HIGH (ref 0.1–1.0)
Monocytes Relative: 11 %
Neutro Abs: 6.8 10*3/uL (ref 1.7–7.7)
Neutrophils Relative %: 70 %
Platelets: 93 10*3/uL — ABNORMAL LOW (ref 150–400)
RBC: 2.48 MIL/uL — ABNORMAL LOW (ref 4.22–5.81)
RDW: 16.7 % — ABNORMAL HIGH (ref 11.5–15.5)
WBC: 9.6 10*3/uL (ref 4.0–10.5)
nRBC: 0 % (ref 0.0–0.2)

## 2020-04-05 LAB — GLUCOSE, CAPILLARY
Glucose-Capillary: 109 mg/dL — ABNORMAL HIGH (ref 70–99)
Glucose-Capillary: 110 mg/dL — ABNORMAL HIGH (ref 70–99)
Glucose-Capillary: 123 mg/dL — ABNORMAL HIGH (ref 70–99)
Glucose-Capillary: 95 mg/dL (ref 70–99)

## 2020-04-05 MED ORDER — METOPROLOL TARTRATE 25 MG PO TABS: 25.0000 mg | ORAL_TABLET | Freq: Two times a day (BID) | ORAL | Status: DC

## 2020-04-05 MED ORDER — HEPARIN SODIUM (PORCINE) 1000 UNIT/ML DIALYSIS: 1000.0000 [IU] | INTRAMUSCULAR | Status: DC | PRN

## 2020-04-05 MED ORDER — ALTEPLASE 2 MG IJ SOLR: 2.0000 mg | Freq: Once | INTRAMUSCULAR | Status: DC | PRN

## 2020-04-05 MED ORDER — LIDOCAINE-PRILOCAINE 2.5-2.5 % EX CREA
1.0000 "application " | TOPICAL_CREAM | CUTANEOUS | Status: DC | PRN
  Filled 2020-04-05: qty 5

## 2020-04-05 MED ORDER — SODIUM CHLORIDE 0.9 % IV SOLN: 100.0000 mL | INTRAVENOUS | Status: DC | PRN

## 2020-04-05 MED ORDER — ALBUTEROL SULFATE (2.5 MG/3ML) 0.083% IN NEBU
2.5000 mg | INHALATION_SOLUTION | Freq: Four times a day (QID) | RESPIRATORY_TRACT | Status: DC | PRN
  Administered 2020-04-05: 05:00:00 2.5 mg via RESPIRATORY_TRACT
  Filled 2020-04-05: qty 3

## 2020-04-05 MED ORDER — LIDOCAINE HCL (PF) 1 % IJ SOLN: 5.0000 mL | INTRAMUSCULAR | Status: DC | PRN

## 2020-04-05 MED ORDER — HEPARIN SODIUM (PORCINE) 1000 UNIT/ML DIALYSIS
20.0000 [IU]/kg | INTRAMUSCULAR | Status: DC | PRN
  Filled 2020-04-05: qty 2

## 2020-04-05 MED ORDER — HEPARIN SODIUM (PORCINE) 1000 UNIT/ML IJ SOLN
INTRAMUSCULAR | Status: AC
  Filled 2020-04-05: qty 4

## 2020-04-05 MED ORDER — PENTAFLUOROPROP-TETRAFLUOROETH EX AERO: 1.0000 "application " | INHALATION_SPRAY | CUTANEOUS | Status: DC | PRN

## 2020-04-05 NOTE — TOC Progression Note (Addendum)
Transition of Care [facility]) - Progression Note    Patient Details  Name: Douglas [name] MRN: [id number] Date of Birth: 1986-12-04  Transition of Care [facility]) CM/SW [place], [place] Phone Number: 04/05/2020, 5:58 PM  Clinical Narrative:     CSW faxed over signed commitment change form that was signed by Dr. Verlon Au  for patient to magistrate. Psych cleared patient. Patient no longer IVC'D.  TOC team will continue to follow.         Expected Discharge Plan and Services                                                 Social Determinants of Health (SDOH) Interventions    Readmission Risk Interventions No flowsheet data found.

## 2020-04-05 NOTE — Progress Notes (Addendum)
Patient wanting to leave AMA. Spoke with patient and explained how important it was for him to stay and get his permanent [place] HD OP set up. He is still refusing. Paged [facility], will get AMA sheet signed. Told mother to bring him back to the hospital if he gets worse.

## 2020-04-05 NOTE — Procedures (Addendum)
Patient was seen on dialysis and the procedure was supervised.  BFR 400  Via TDC BP is  175/103.  UF goal 3.5 kg, continue current medications. Monitor BP. Ordered vein mapping. Will consult [facility] for perm access. Patient appears to be tolerating treatment well.  Jahdai Padovano Tanna Furry 04/05/2020
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
## 2020-04-05 NOTE — Progress Notes (Signed)
Renal Navigator has completed patient's referral for OP HD treatment (ESRD), however, notes that this may be a difficult placement given his behaviors in the hospital and lack of insurance. Navigator has relayed to the OP HD clinic where treatment has been requested that patient's mother has stated willingness to be his sitter if needed, since she has a flexible work schedule as an Automotive engineer.  Navigator's main concern is patient's uninsured status, lack of work hx, and his mother's report that he has refused to apply for Disability. Patient has not been financially cleared for OP HD. Navigator left message for D. Taylor/CM to see if patient has applied for Medicaid yet while in the hospital.  Douglas [name], [place] Renal Navigator [phone]

## 2020-04-05 NOTE — Progress Notes (Signed)
Attempted upper extremity vein mapping. Per patient's Mother at the bedside, patient was just able to fall asleep and she would like this tried again tomorrow. Will attempt again as schedule permits.  04/05/2020 4:05 PM Kelby Aline., MHA, RVT, RDCS, RDMS

## 2020-04-05 NOTE — Consult Note (Addendum)
  Spoke with Dr. Verlon Au has concerns about patient psychiatric status.  States that patients mother and staff reports that patient is delusional; paranoid thinking people are out to get him; that staff are ding things against his will; patient refusing treatment.  States that patient has poor insight with a history of ADHD and ODD, later on TBI.  Would like another psychiatric consult.  Informed to reorder so would show on consult board.

## 2020-04-05 NOTE — Consult Note (Addendum)
Hospital Consult    Reason for Consult:  Permanent dialysis access Requesting Physician:  Dr. Carolin Sicks MRN #:  [id number]  History of Present Illness: This is a 34 y.o. male seen in consultation for evaluation for permanent dialysis access.  Patient's mother was the primary historian during today's visit.  The patient did not want to participate in exam or during conversation.  Past medical history significant for CKD now end-stage renal disease on hemodialysis.  Interventional radiology placed a right IJ TDC on 04/03/2020.  He is left arm dominant and would prefer access placement in right arm.  He does not take any blood thinners.  Of note patient has history of ADHD, ODD, and depression and is currently being evaluated by psychiatry.  Past Medical History:  Diagnosis Date  . Asthma   . Brain injury ([place])   . COPD (chronic obstructive pulmonary disease) ([place])   . Hypertension   . Sleep apnea     Past Surgical History:  Procedure Laterality Date  . IR FLUORO GUIDE CV LINE RIGHT  04/03/2020  . IR US GUIDE VASC ACCESS RIGHT  04/03/2020  . MYRINGOTOMY      Allergies  Allergen Reactions  . Pork-Derived Products Nausea And Vomiting    Prior to Admission medications   Medication Sig Start Date End Date Taking? Authorizing Provider  ibuprofen (ADVIL) 200 MG tablet Take 400 mg by mouth every 6 (six) hours as needed for fever (pain).   Yes [provider]    Social History   Socioeconomic History  . Marital status: Single    Spouse name: Not on file  . Number of children: Not on file  . Years of education: Not on file  . Highest education level: Not on file  Occupational History  . Not on file  Tobacco Use  . Smoking status: Current Every Day Smoker    Types: Cigarettes  . Smokeless tobacco: Current User  Substance and Sexual Activity  . Alcohol use: Yes  . Drug use: Yes    Types: Marijuana  . Sexual activity: Not on file  Other Topics Concern  . Not on file    Social History Narrative  . Not on file   Social Determinants of Health   Financial Resource Strain:   . Difficulty of Paying Living Expenses:   Food Insecurity:   . Worried About Charity fundraiser in the Last Year:   . Arboriculturist in the Last Year:   Transportation Needs:   . Film/video editor (Medical):   Marland Kitchen Lack of Transportation (Non-Medical):   Physical Activity:   . Days of Exercise per Week:   . Minutes of Exercise per Session:   Stress:   . Feeling of Stress :   Social Connections:   . Frequency of Communication with Friends and Family:   . Frequency of Social Gatherings with Friends and Family:   . Attends Religious Services:   . Active Member of Clubs or Organizations:   . Attends Archivist Meetings:   Marland Kitchen Marital Status:   Intimate Partner Violence:   . Fear of Current or Ex-Partner:   . Emotionally Abused:   Marland Kitchen Physically Abused:   . Sexually Abused:      History reviewed. No pertinent family history.  ROS: Otherwise negative unless mentioned in HPI  Physical Examination  Vitals:   04/05/20 1139 04/05/20 1237  BP: (!) 191/113 (!) 178/106  Pulse: 88 (!) 101  Resp: (!) 24  Temp: 97.8 F (36.6 C) 98.4 F (36.9 C)  SpO2: 100% 98%   Body mass index is 26.8 kg/m.  General:  WDWN in NAD Gait: Not observed HENT: WNL, normocephalic Pulmonary: normal non-labored breathing, without Rales, rhonchi,  wheezing Cardiac: regular Abdomen:  soft, NT/ND, no masses Skin: without rashes Vascular Exam/Pulses: Symmetrical radial pulses Extremities: without ischemic changes, without Gangrene , without cellulitis; without open wounds;  Musculoskeletal: no muscle wasting or atrophy  Neurologic: A&O X 3;  No focal weakness or paresthesias are detected; speech is fluent/normal Psychiatric:  The pt has Normal affect. Lymph:  Unremarkable  CBC    Component Value Date/Time   WBC 9.6 04/05/2020 1318   RBC 2.48 (L) 04/05/2020 1318   HGB 7.5 (L)  04/05/2020 1318   HCT 23.9 (L) 04/05/2020 1318   PLT 93 (L) 04/05/2020 1318   MCV 96.4 04/05/2020 1318   MCH 30.2 04/05/2020 1318   MCHC 31.4 04/05/2020 1318   RDW 16.7 (H) 04/05/2020 1318   LYMPHSABS 1.1 04/05/2020 1318   MONOABS 1.1 (H) 04/05/2020 1318   EOSABS 0.5 04/05/2020 1318   BASOSABS 0.1 04/05/2020 1318    BMET    Component Value Date/Time   NA 132 (L) 04/05/2020 0737   K 4.7 04/05/2020 0737   CL 94 (L) 04/05/2020 0737   CO2 21 (L) 04/05/2020 0737   GLUCOSE 103 (H) 04/05/2020 0737   BUN 82 (H) 04/05/2020 0737   CREATININE 14.79 (H) 04/05/2020 0737   CALCIUM 8.9 04/05/2020 0737   GFRNONAA 4 (L) 04/05/2020 0737   GFRAA 4 (L) 04/05/2020 0737    COAGS: Lab Results  Component Value Date   INR 1.1 03/31/2020   INR 1.1 03/31/2020   INR 1.1 07/29/2008     Non-Invasive Vascular Imaging:   Vein mapping pending   ASSESSMENT/PLAN: This is a 35 y.o. male with end-stage renal disease on hemodialysis; seen in consultation for evaluation for permanent dialysis access  Left arm dominant Vein mapping pending Patient's mother was primary historian during today's visit as patient did not want to participate much in conversation Vascular will check back after vein mapping to see if patient is willing to proceed with permanent dialysis access placement   Dagoberto Ligas PA-C [facility] [phone]  I have independently interviewed and examined patient and agree with PA assessment and plan above.  Patient minimally conversive during exam his mother was present.  At this time patient states that he is not going to pursue dialysis any further.  I did discuss with him that prior to any surgical intervention we would need vein mapping we could have a more educated discussion.  He did not have any questions.  If he does get vein mapping I will follow up with him otherwise we will see him when he is agreeable to pursue permanent access.  Zunaira Lamy C. Donzetta Matters,  MD [facility] [place] Office: [phone] Pager: [phone]

## 2020-04-06 NOTE — Discharge Summary (Signed)
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 washings and  aspirates are unacceptable for Xpert Xpress SARS-CoV-2/FLU/RSV  testing. Fact Sheet for Patients: PinkCheek.be Fact Sheet for Healthcare Providers: GravelBags.it This test is not yet approved or cleared by the [place] FDA and  has been authorized for detection and/or diagnosis of SARS-CoV-2 by  FDA under an Emergency Use Authorization (EUA). This EUA will remain  in effect (meaning this test can be used) for the duration of the  Covid-19 declaration under Section 564(b)(1) of the Act, 21  U.S.C. section 360bbb-3(b)(1), unless the authorization is  terminated or revoked. Performed at [facility], [place] [address], [place], [place] [address]   Blood culture (routine x 2)     Status: None   Collection Time: 03/31/20  5:09 PM   Specimen: BLOOD RIGHT FOREARM  Result Value Ref Range Status   Specimen  Description BLOOD RIGHT FOREARM  Final   Special Requests   Final    BOTTLES DRAWN AEROBIC AND ANAEROBIC Blood Culture adequate volume   Culture   Final    NO GROWTH 5 DAYS Performed at [facility], [place] [address] [place], [place] [address]    Report Status 04/05/2020 FINAL  Final  Blood culture (routine x 2)     Status: None   Collection Time: 03/31/20  5:11 PM   Specimen: BLOOD  Result Value Ref Range Status   Specimen Description BLOOD LEFT ANTECUBITAL  Final  Special Requests   Final    BOTTLES DRAWN AEROBIC AND ANAEROBIC Blood Culture adequate volume   Culture   Final    NO GROWTH 5 DAYS Performed at [facility], [place] [address], [place], [place] [address]    Report Status 04/05/2020 FINAL  Final  MRSA PCR Screening     Status: None   Collection Time: 03/31/20  6:41 PM   Specimen: Nasal Mucosa; Nasopharyngeal  Result Value Ref Range Status   MRSA by PCR NEGATIVE NEGATIVE Final    Comment:        The GeneXpert MRSA Assay (FDA approved for NASAL specimens only), is one component of a comprehensive MRSA colonization surveillance program. It is not intended to diagnose MRSA infection nor to guide or monitor treatment for MRSA infections. Performed at [facility], [place] [address] [place], [place] [address]   Expectorated sputum assessment w rflx to resp cult     Status: None   Collection Time: 03/31/20 10:33 PM   Specimen: Sputum  Result Value Ref Range Status   Specimen Description SPUTUM  Final   Special Requests NONE  Final   Sputum evaluation   Final    THIS SPECIMEN IS ACCEPTABLE FOR SPUTUM CULTURE Performed at [facility], [place] [address] [place], [place] [address]    Report Status 04/01/2020 FINAL  Final  Culture, respiratory     Status: None   Collection Time: 03/31/20 10:33 PM   Specimen: SPU  Result Value Ref Range Status   Specimen Description SPUTUM  Final   Special Requests NONE Reflexed from [id number]  Final   Gram Stain    Final    RARE WBC PRESENT,BOTH PMN AND MONONUCLEAR FEW GRAM POSITIVE COCCI IN PAIRS FEW GRAM POSITIVE RODS RARE GRAM NEGATIVE RODS    Culture   Final    ABUNDANT Consistent with normal respiratory flora. Performed at [facility], [place] [address] [place],  [address]    Report Status 04/03/2020 FINAL  Final     Labs: Basic Metabolic Panel: Recent Labs  Lab 04/01/20 0423 04/02/20 0347 04/02/20 1624 04/03/20 0720 04/05/20 0737  NA 133* 133* 137 135 132*  K 3.9 4.5 4.6 4.4 4.7  CL 91* 90* 95* 95* 94*  CO2 '23 22 26 25 '$ 21*  GLUCOSE 107* 104* 113* 112* 103*  BUN 100* 118* 73* 90* 82*  CREATININE 13.11* 18.12* 12.68* 14.68* 14.79*  CALCIUM 8.2* 9.1 9.2 8.7* 8.9  PHOS 5.0* 8.6* 6.0* 6.9* 5.9*   Liver Function Tests: Recent Labs  Lab 03/31/20 1457 03/31/20 1457 04/01/20 0423 04/02/20 0347 04/02/20 1624 04/03/20 0720 04/05/20 0737  AST 26  --   --   --   --   --   --   ALT 18  --   --   --   --   --   --   ALKPHOS 72  --   --   --   --   --   --   BILITOT 1.0  --   --   --   --   --   --   PROT 6.4*  --   --   --   --   --   --   ALBUMIN 3.1*   < > 3.1* 3.2* 3.3* 2.9* 3.0*   < > = values in this interval not displayed.   No results for input(s): LIPASE, AMYLASE in the last 168 hours. No results  for input(s): AMMONIA in the last 168 hours. CBC: Recent Labs  Lab 03/31/20 1500 03/31/20 1709 04/02/20 0347 04/03/20 0357 04/04/20 0609 04/05/20 0737 04/05/20 1318  WBC 9.1   < > 9.4 9.1 10.2 11.9* 9.6  NEUTROABS 7.5  --   --  6.7 7.7  --  6.8  HGB 8.3*   < > 8.0* 7.0* 7.2* 7.1* 7.5*  HCT 23.8*   < > 24.3* 21.8* 23.0* 23.2* 23.9*  MCV 88.8   < > 91.0 95.2 97.5 97.9 96.4  PLT 33*   < > 81* 72* 80* 100* 93*   < > = values in this interval not displayed.   Cardiac Enzymes: Recent Labs  Lab 03/31/20 1457  CKTOTAL 421*   BNP: BNP (last 3 results) Recent Labs    03/31/20 1500  BNP 3,957.9*    ProBNP (last 3 results) No results for input(s):  PROBNP in the last 8760 hours.  CBG: Recent Labs  Lab 04/04/20 2326 04/05/20 0430 04/05/20 1238 04/05/20 1619 04/05/20 1940  GLUCAP 100* 109* 110* 123* 95       Signed:  Nita Sells MD   [facility] 04/06/2020, 10:13 AM

## 2020-04-08 ENCOUNTER — Encounter (HOSPITAL_COMMUNITY): Payer: Self-pay | Admitting: Internal Medicine

## 2020-04-08 ENCOUNTER — Inpatient Hospital Stay (HOSPITAL_COMMUNITY)
Admission: EM | Admit: 2020-04-08 | Discharge: 2020-04-15 | DRG: 673 | Disposition: A | Discharge status: Home / Self Care | Payer: Medicaid Other | Attending: Internal Medicine | Admitting: Internal Medicine

## 2020-04-08 ENCOUNTER — Emergency Department (HOSPITAL_COMMUNITY): Payer: Medicaid Other

## 2020-04-08 DIAGNOSIS — E871 Hypo-osmolality and hyponatremia: Secondary | ICD-10-CM | POA: Diagnosis present

## 2020-04-08 DIAGNOSIS — E889 Metabolic disorder, unspecified: Secondary | ICD-10-CM | POA: Diagnosis present

## 2020-04-08 DIAGNOSIS — D631 Anemia in chronic kidney disease: Secondary | ICD-10-CM | POA: Diagnosis present

## 2020-04-08 DIAGNOSIS — S069X9S Unspecified intracranial injury with loss of consciousness of unspecified duration, sequela: Secondary | ICD-10-CM

## 2020-04-08 DIAGNOSIS — Z599 Problem related to housing and economic circumstances, unspecified: Secondary | ICD-10-CM

## 2020-04-08 DIAGNOSIS — N186 End stage renal disease: Secondary | ICD-10-CM | POA: Diagnosis present

## 2020-04-08 DIAGNOSIS — E8770 Fluid overload, unspecified: Secondary | ICD-10-CM | POA: Diagnosis present

## 2020-04-08 DIAGNOSIS — F329 Major depressive disorder, single episode, unspecified: Secondary | ICD-10-CM | POA: Diagnosis present

## 2020-04-08 DIAGNOSIS — I161 Hypertensive emergency: Secondary | ICD-10-CM | POA: Diagnosis present

## 2020-04-08 DIAGNOSIS — Z992 Dependence on renal dialysis: Secondary | ICD-10-CM

## 2020-04-08 DIAGNOSIS — J810 Acute pulmonary edema: Secondary | ICD-10-CM | POA: Diagnosis present

## 2020-04-08 DIAGNOSIS — Z8673 Personal history of transient ischemic attack (TIA), and cerebral infarction without residual deficits: Secondary | ICD-10-CM

## 2020-04-08 DIAGNOSIS — R251 Tremor, unspecified: Secondary | ICD-10-CM

## 2020-04-08 DIAGNOSIS — N189 Chronic kidney disease, unspecified: Secondary | ICD-10-CM | POA: Diagnosis present

## 2020-04-08 DIAGNOSIS — Z791 Long term (current) use of non-steroidal anti-inflammatories (NSAID): Secondary | ICD-10-CM

## 2020-04-08 DIAGNOSIS — N19 Unspecified kidney failure: Secondary | ICD-10-CM

## 2020-04-08 DIAGNOSIS — F909 Attention-deficit hyperactivity disorder, unspecified type: Secondary | ICD-10-CM | POA: Diagnosis present

## 2020-04-08 DIAGNOSIS — I160 Hypertensive urgency: Secondary | ICD-10-CM | POA: Diagnosis present

## 2020-04-08 DIAGNOSIS — F1290 Cannabis use, unspecified, uncomplicated: Secondary | ICD-10-CM | POA: Diagnosis present

## 2020-04-08 DIAGNOSIS — I129 Hypertensive chronic kidney disease with stage 1 through stage 4 chronic kidney disease, or unspecified chronic kidney disease: Secondary | ICD-10-CM | POA: Diagnosis present

## 2020-04-08 DIAGNOSIS — E875 Hyperkalemia: Secondary | ICD-10-CM | POA: Diagnosis present

## 2020-04-08 DIAGNOSIS — G9349 Other encephalopathy: Secondary | ICD-10-CM | POA: Diagnosis present

## 2020-04-08 DIAGNOSIS — J449 Chronic obstructive pulmonary disease, unspecified: Secondary | ICD-10-CM | POA: Diagnosis present

## 2020-04-08 DIAGNOSIS — I120 Hypertensive chronic kidney disease with stage 5 chronic kidney disease or end stage renal disease: Principal | ICD-10-CM | POA: Diagnosis present

## 2020-04-08 DIAGNOSIS — D696 Thrombocytopenia, unspecified: Secondary | ICD-10-CM | POA: Diagnosis present

## 2020-04-08 DIAGNOSIS — N2581 Secondary hyperparathyroidism of renal origin: Secondary | ICD-10-CM | POA: Diagnosis present

## 2020-04-08 DIAGNOSIS — Z20822 Contact with and (suspected) exposure to covid-19: Secondary | ICD-10-CM | POA: Diagnosis present

## 2020-04-08 DIAGNOSIS — G4733 Obstructive sleep apnea (adult) (pediatric): Secondary | ICD-10-CM | POA: Diagnosis present

## 2020-04-08 LAB — BASIC METABOLIC PANEL
Anion gap: 17 — ABNORMAL HIGH (ref 5–15)
BUN: 110 mg/dL — ABNORMAL HIGH (ref 6–20)
CO2: 21 mmol/L — ABNORMAL LOW (ref 22–32)
Calcium: 9.1 mg/dL (ref 8.9–10.3)
Chloride: 95 mmol/L — ABNORMAL LOW (ref 98–111)
Creatinine, Ser: 17.8 mg/dL — ABNORMAL HIGH (ref 0.61–1.24)
GFR calc Af Amer: 4 mL/min — ABNORMAL LOW (ref >60)
GFR calc non Af Amer: 3 mL/min — ABNORMAL LOW (ref >60)
Glucose, Bld: 102 mg/dL — ABNORMAL HIGH (ref 70–99)
Potassium: 5.2 mmol/L — ABNORMAL HIGH (ref 3.5–5.1)
Sodium: 133 mmol/L — ABNORMAL LOW (ref 135–145)

## 2020-04-08 LAB — CBC
HCT: 24.2 % — ABNORMAL LOW (ref 39.0–52.0)
Hemoglobin: 7.7 g/dL — ABNORMAL LOW (ref 13.0–17.0)
MCH: 30.4 pg (ref 26.0–34.0)
MCHC: 31.8 g/dL (ref 30.0–36.0)
MCV: 95.7 fL (ref 80.0–100.0)
Platelets: 96 10*3/uL — ABNORMAL LOW (ref 150–400)
RBC: 2.53 MIL/uL — ABNORMAL LOW (ref 4.22–5.81)
RDW: 16.2 % — ABNORMAL HIGH (ref 11.5–15.5)
WBC: 8.2 10*3/uL (ref 4.0–10.5)
nRBC: 0 % (ref 0.0–0.2)

## 2020-04-08 IMAGING — DX DG CHEST 2V
2 series · 2 of 2 positions shown · non-contrast
Comparison: Chest x-ray dated [DATE].

CLINICAL DATA: Shortness of breath.

EXAM:
CHEST - 2 VIEW

[chest pa]
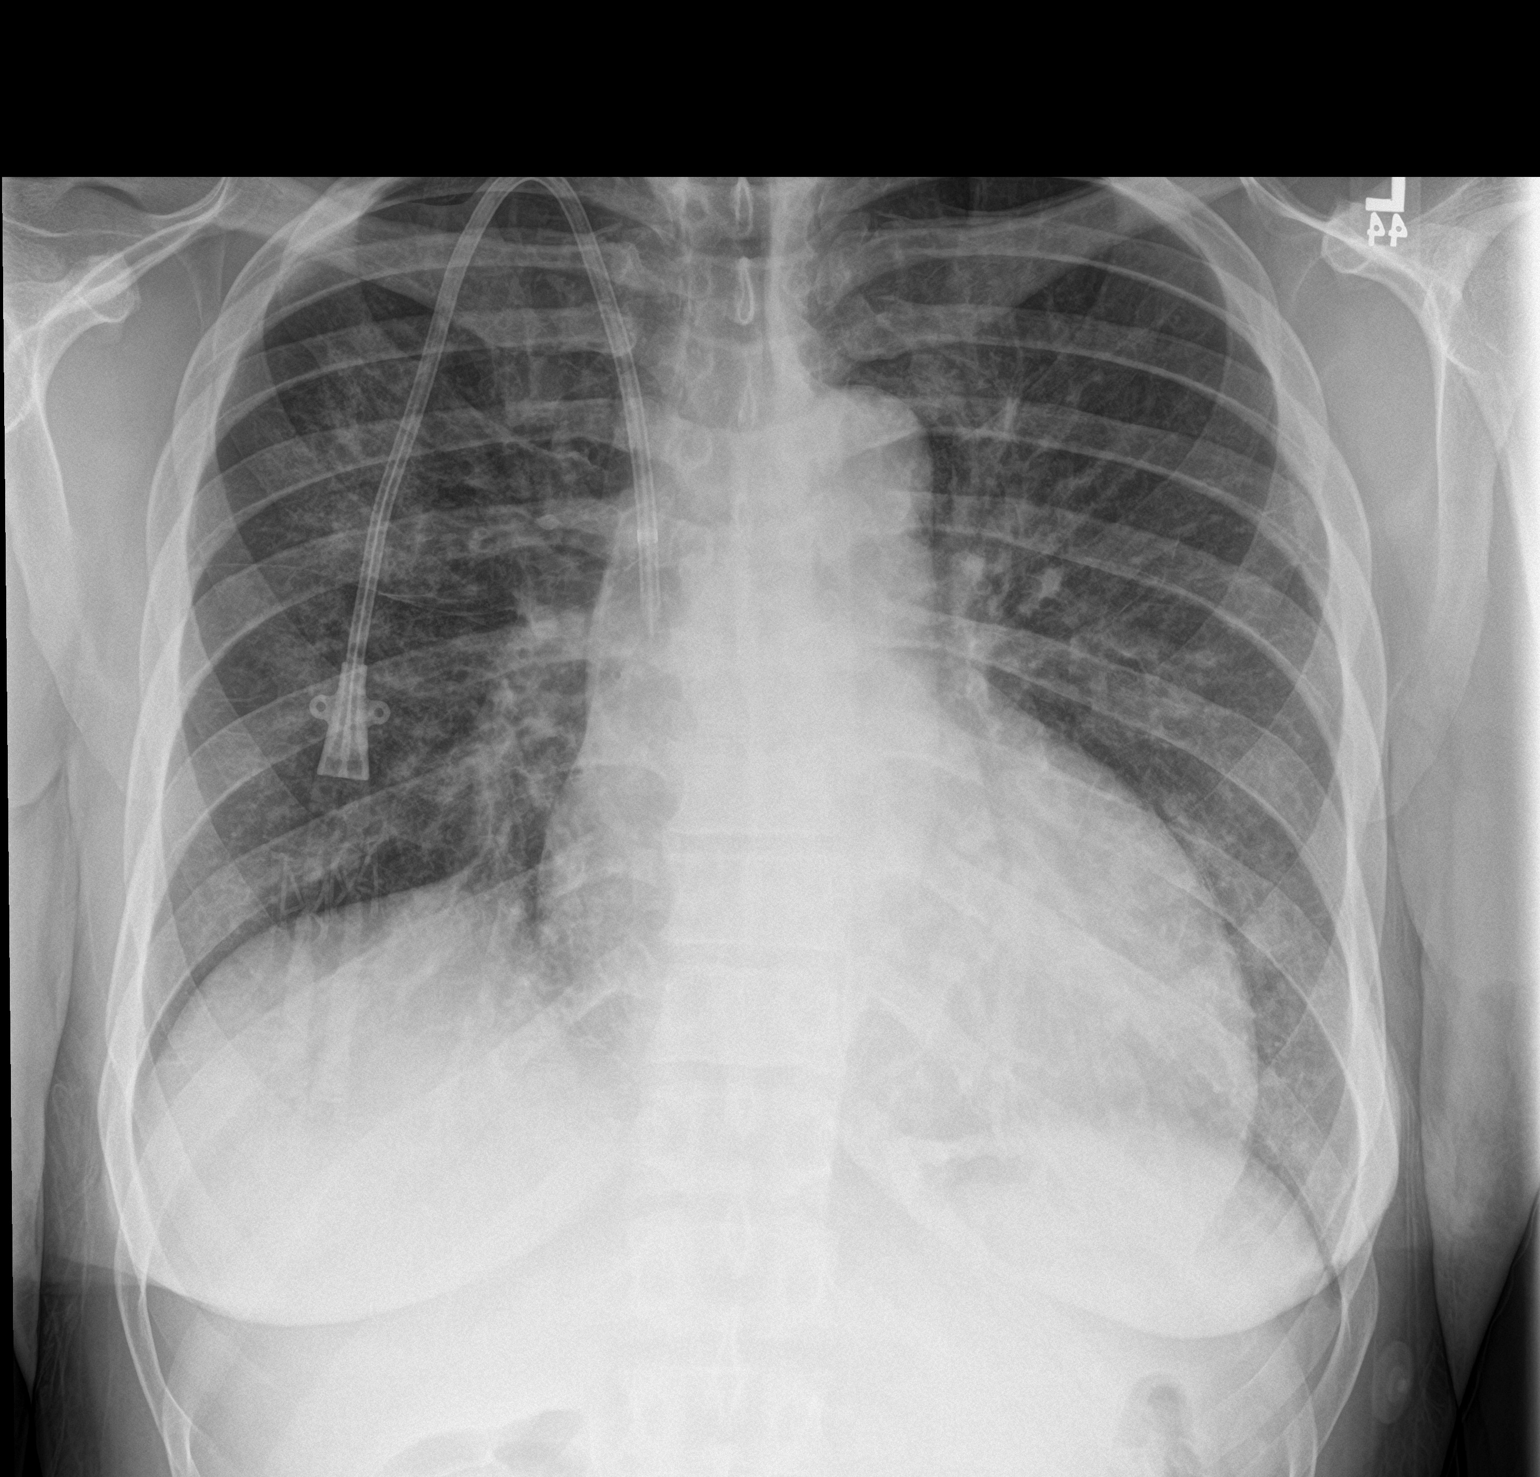

[chest lat]
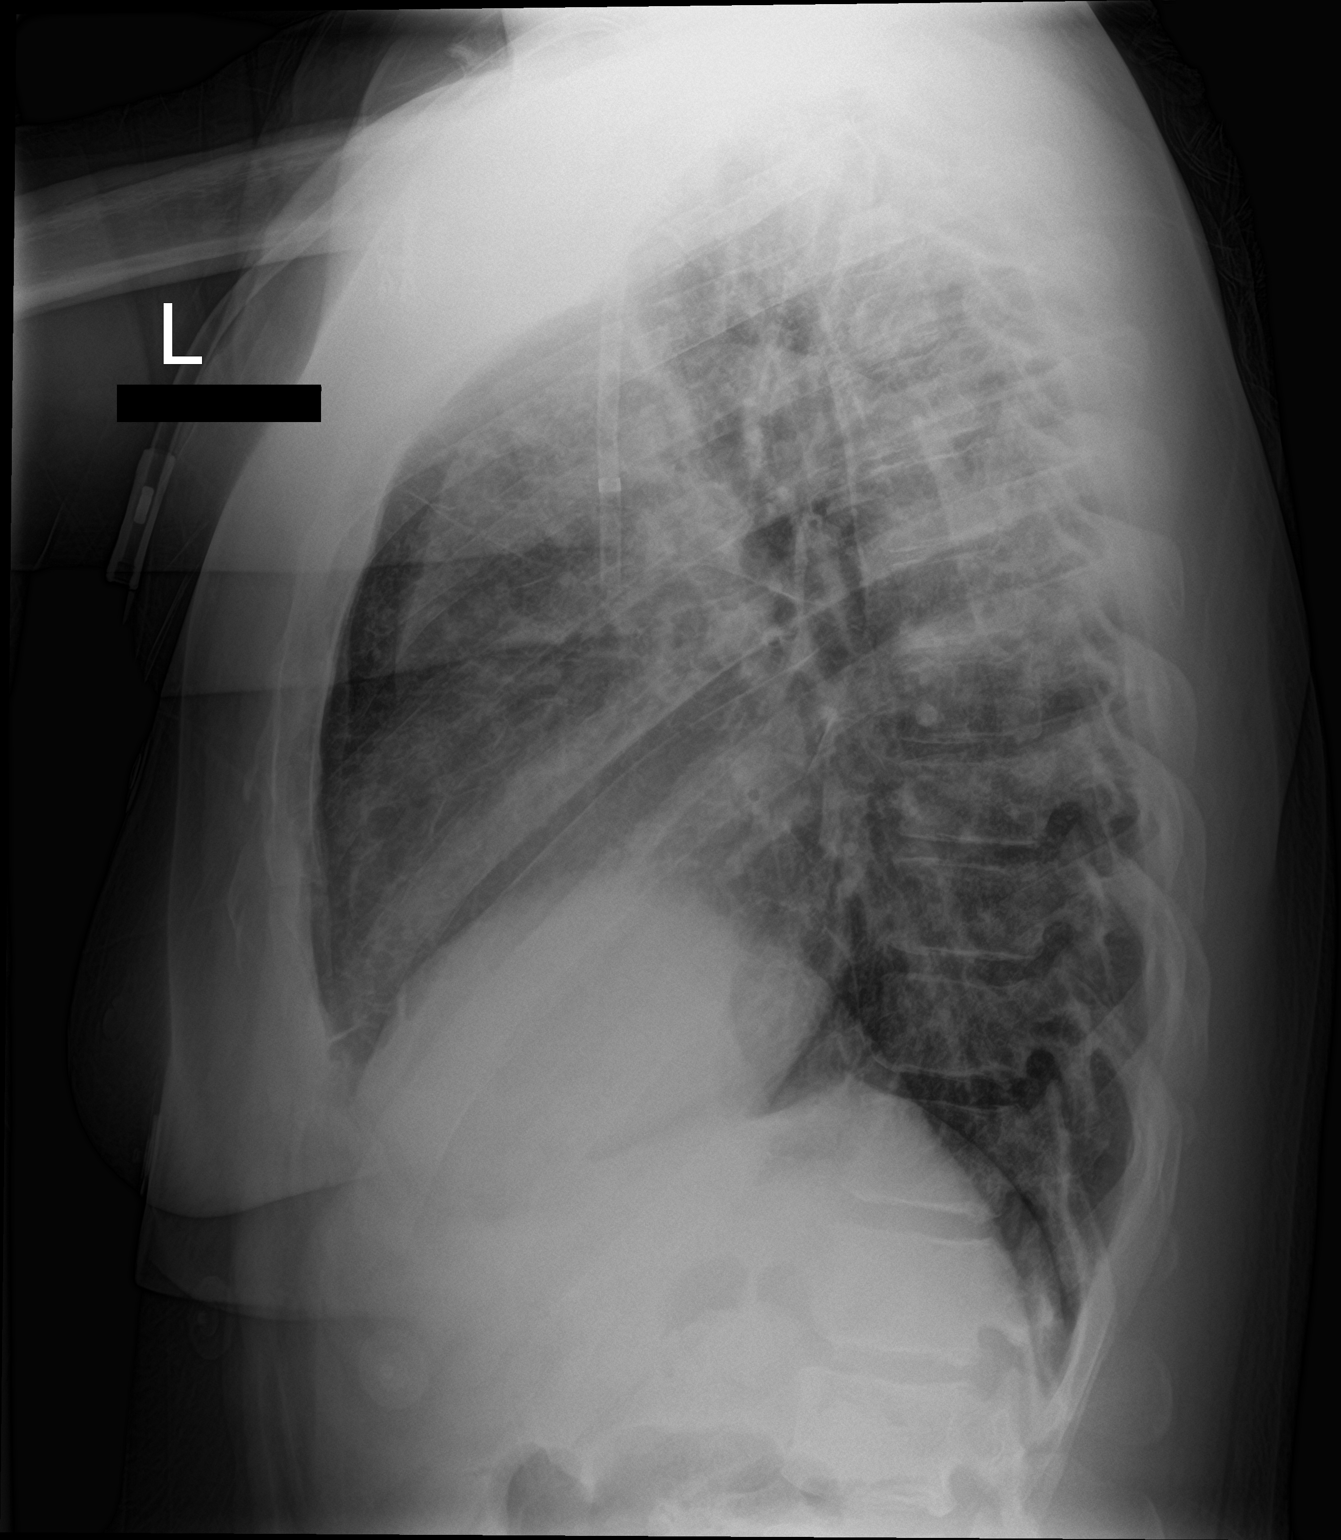

[2 of 2 positions shown; findings below may reference images not displayed]

FINDINGS: New tunneled right internal jugular dialysis catheter with tip in
the distal SVC. Stable cardiomegaly. Slightly worsened pulmonary
vascular congestion and mild peripheral interstitial thickening.
Improving opacity in the peripheral right lung with mild residual
opacity in the right upper lobe. No pleural effusion or
pneumothorax. No acute osseous abnormality.
IMPRESSION: 1. Mildly worsened pulmonary vascular congestion and interstitial
edema.
2. Improving airspace disease in the peripheral right lung.

## 2020-04-08 MED ORDER — HYDRALAZINE HCL 25 MG PO TABS
50.0000 mg | ORAL_TABLET | Freq: Three times a day (TID) | ORAL | Status: DC
  Administered 2020-04-08: 22:00:00 50 mg via ORAL
  Filled 2020-04-08: qty 2

## 2020-04-08 MED ORDER — LABETALOL HCL 5 MG/ML IV SOLN
10.0000 mg | Freq: Once | INTRAVENOUS | Status: AC
  Administered 2020-04-08: 10 mg via INTRAVENOUS
  Filled 2020-04-08: qty 4

## 2020-04-08 MED ORDER — ALBUTEROL SULFATE (2.5 MG/3ML) 0.083% IN NEBU
2.5000 mg | INHALATION_SOLUTION | RESPIRATORY_TRACT | Status: DC | PRN
  Administered 2020-04-09: 2.5 mg via RESPIRATORY_TRACT
  Filled 2020-04-08: qty 3

## 2020-04-08 MED ORDER — HYDRALAZINE HCL 20 MG/ML IJ SOLN
10.0000 mg | Freq: Four times a day (QID) | INTRAMUSCULAR | Status: DC | PRN
  Administered 2020-04-09: 10 mg via INTRAVENOUS
  Filled 2020-04-08: qty 1

## 2020-04-08 MED ORDER — ONDANSETRON HCL 4 MG PO TABS: 4.0000 mg | ORAL_TABLET | Freq: Four times a day (QID) | ORAL | Status: DC | PRN

## 2020-04-08 MED ORDER — POLYETHYLENE GLYCOL 3350 17 G PO PACK: 17.0000 g | PACK | Freq: Every day | ORAL | Status: DC | PRN

## 2020-04-08 MED ORDER — ACETAMINOPHEN 650 MG RE SUPP: 650.0000 mg | Freq: Four times a day (QID) | RECTAL | Status: DC | PRN

## 2020-04-08 MED ORDER — ONDANSETRON HCL 4 MG/2ML IJ SOLN: 4.0000 mg | Freq: Four times a day (QID) | INTRAMUSCULAR | Status: DC | PRN

## 2020-04-08 MED ORDER — ACETAMINOPHEN 325 MG PO TABS
650.0000 mg | ORAL_TABLET | Freq: Four times a day (QID) | ORAL | Status: DC | PRN
  Filled 2020-04-08: qty 2

## 2020-04-08 MED ORDER — CARVEDILOL 12.5 MG PO TABS
12.5000 mg | ORAL_TABLET | Freq: Two times a day (BID) | ORAL | Status: DC
  Administered 2020-04-08 – 2020-04-14 (×12): 12.5 mg via ORAL
  Filled 2020-04-08 (×12): qty 1
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
## 2020-04-08 NOTE — ED Notes (Signed)
Advised EDP that pt was still very hypertensive after giving lopressor.

## 2020-04-08 NOTE — ED Triage Notes (Signed)
Pt in [place] with family. Per family pt left AMA Friday, was admitted for hypertensive crisis and AKI. Requesting to be admitted again, states he needs dialysis.

## 2020-04-08 NOTE — ED Provider Notes (Signed)
[facility] EMERGENCY DEPARTMENT Provider Note   CSN: [id number] Arrival date & time: 04/08/20  1555     History Chief Complaint  Patient presents with  . Needs Dialysis    Douglas [name] is a 34 y.o. male.  Presents to ER for dialysis.  Recent admission for hypertensive emergency, acute renal failure.  Patient started on dialysis, had dialysis catheter placed.  The for outpatient dialysis was set up, patient left AMA.  He is now willing to be admitted to get dialysis.  He denies any chest pain, difficulty breathing, abdominal pain, vomiting, headaches.  HPI     Past Medical History:  Diagnosis Date  . Asthma   . Brain injury ([place])   . COPD (chronic obstructive pulmonary disease) ([place])   . Hypertension   . Sleep apnea     Patient Active Problem List   Diagnosis Date Noted  . Acute renal failure (ARF) ([place]) 03/31/2020  . Malignant hypertension     Past Surgical History:  Procedure Laterality Date  . IR FLUORO GUIDE CV LINE RIGHT  04/03/2020  . IR US GUIDE VASC ACCESS RIGHT  04/03/2020  . MYRINGOTOMY         No family history on file.  Social History   Tobacco Use  . Smoking status: Current Every Day Smoker    Types: Cigarettes  . Smokeless tobacco: Current User  Substance Use Topics  . Alcohol use: Yes  . Drug use: Yes    Types: Marijuana    Home Medications Prior to Admission medications   Medication Sig Start Date End Date Taking? Authorizing Provider  ibuprofen (ADVIL) 200 MG tablet Take 400 mg by mouth every 6 (six) hours as needed for fever (pain).    [provider]    Allergies    Pork-derived products  Review of Systems   Review of Systems  Constitutional: Negative for chills and fever.  HENT: Negative for ear pain and sore throat.   Eyes: Negative for pain and visual disturbance.  Respiratory: Negative for cough and shortness of breath.   Cardiovascular: Negative for chest pain and palpitations.    Gastrointestinal: Negative for abdominal pain and vomiting.  Genitourinary: Negative for dysuria and hematuria.  Musculoskeletal: Negative for arthralgias and back pain.  Skin: Negative for color change and rash.  Neurological: Negative for seizures and syncope.  All other systems reviewed and are negative.   Physical Exam Updated Vital Signs BP (!) 223/154   Pulse (!) 106   Temp 98.3 F (36.8 C) (Oral)   Resp (!) 21   SpO2 100%   Physical Exam Vitals and nursing note reviewed.  Constitutional:      Appearance: He is well-developed.  HENT:     Head: Normocephalic and atraumatic.  Eyes:     Conjunctiva/sclera: Conjunctivae normal.  Cardiovascular:     Rate and Rhythm: Normal rate and regular rhythm.     Heart sounds: No murmur.  Pulmonary:     Effort: Pulmonary effort is normal. No respiratory distress.     Breath sounds: Normal breath sounds.     Comments: Dialysis catheter is C/D/I Abdominal:     Palpations: Abdomen is soft.     Tenderness: There is no abdominal tenderness.  Musculoskeletal:        General: No deformity or signs of injury.     Cervical back: Neck supple.  Skin:    General: Skin is warm and dry.     Capillary Refill: Capillary  refill takes less than 2 seconds.  Neurological:     General: No focal deficit present.     Mental Status: He is alert and oriented to person, place, and time.  Psychiatric:        Mood and Affect: Mood normal.     ED Results / Procedures / Treatments   Labs (all labs ordered are listed, but only abnormal results are displayed) Labs Reviewed  CBC - Abnormal; Notable for the following components:      Result Value   RBC 2.53 (*)    Hemoglobin 7.7 (*)    HCT 24.2 (*)    RDW 16.2 (*)    Platelets 96 (*)    All other components within normal limits  BASIC METABOLIC PANEL - Abnormal; Notable for the following components:   Sodium 133 (*)    Potassium 5.2 (*)    Chloride 95 (*)    CO2 21 (*)    Glucose, Bld 102 (*)     BUN 110 (*)    Creatinine, Ser 17.80 (*)    GFR calc non Af Amer 3 (*)    GFR calc Af Amer 4 (*)    Anion gap 17 (*)    All other components within normal limits  SARS CORONAVIRUS 2 (TAT 6-24 HRS)  URINALYSIS, ROUTINE W REFLEX MICROSCOPIC    EKG EKG Interpretation  Date/Time:  Monday April 08 2020 17:03:06 EDT Ventricular Rate:  106 PR Interval:  178 QRS Duration: 106 QT Interval:  350 QTC Calculation: 464 R Axis:   -22 Text Interpretation: Sinus tachycardia Right atrial enlargement Left ventricular hypertrophy with repolarization abnormality ( R in aVL , Sokolow-Lyon , Cornell product ) Abnormal ECG no acute changes Confirmed by Madalyn Rob [phone]) on 04/08/2020 7:23:03 PM   Radiology DG Chest 2 View  Result Date: 04/08/2020 CLINICAL DATA:  Shortness of breath. EXAM: CHEST - 2 VIEW COMPARISON:  Chest x-ray dated March 31, 2020. FINDINGS: New tunneled right internal jugular dialysis catheter with tip in the distal SVC. Stable cardiomegaly. Slightly worsened pulmonary vascular congestion and mild peripheral interstitial thickening. Improving opacity in the peripheral right lung with mild residual opacity in the right upper lobe. No pleural effusion or pneumothorax. No acute osseous abnormality. IMPRESSION: 1. Mildly worsened pulmonary vascular congestion and interstitial edema. 2. Improving airspace disease in the peripheral right lung. Electronically Signed   By: Titus Dubin M.D.   On: 04/08/2020 17:33    Procedures .Critical Care Performed by: Lucrezia Starch, MD Authorized by: Lucrezia Starch, MD   Critical care provider statement:    Critical care time (minutes):  34   Critical care was necessary to treat or prevent imminent or life-threatening deterioration of the following conditions:  Renal failure   Critical care was time spent personally by me on the following activities:  Discussions with consultants, evaluation of patient's response to treatment,  examination of patient, ordering and performing treatments and interventions, ordering and review of laboratory studies, ordering and review of radiographic studies, pulse oximetry, re-evaluation of patient's condition, obtaining history from patient or surrogate and review of old charts   (including critical care time)  Medications Ordered in ED Medications  labetalol (NORMODYNE) injection 10 mg (10 mg Intravenous Given 04/08/20 2034)    ED Course  I have reviewed the triage vital signs and the nursing notes.  Pertinent labs & imaging results that were available during my care of the patient were reviewed by me and considered in my  medical decision making (see chart for details).    MDM Rules/Calculators/A&P                      34 year old male recently noted for hypertensive emergency, acute renal failure requiring dialysis.  Had left AMA prior to having dialysis set up in the outpatient setting.  Now willing to be admitted and continued dialysis.  Labs notable for renal failure, borderline hyperkalemia.  Marvis Repress, nephrology, will add a list to get dialysis tomorrow, recommend hospitalist admission.  Hypertensive in ER but no symptoms.  Provided dose of labetalol, consult hospitalist for admission, discussed with [facility] who will admit.   Final Clinical Impression(s) / ED Diagnoses Final diagnoses:  Renal failure, unspecified chronicity  Hyperkalemia    Rx / DC Orders ED Discharge Orders    None       Lucrezia Starch, MD 04/09/20 2353

## 2020-04-09 ENCOUNTER — Other Ambulatory Visit: Payer: Self-pay

## 2020-04-09 DIAGNOSIS — Z992 Dependence on renal dialysis: Secondary | ICD-10-CM | POA: Diagnosis not present

## 2020-04-09 DIAGNOSIS — F1290 Cannabis use, unspecified, uncomplicated: Secondary | ICD-10-CM | POA: Diagnosis present

## 2020-04-09 DIAGNOSIS — G4733 Obstructive sleep apnea (adult) (pediatric): Secondary | ICD-10-CM | POA: Diagnosis present

## 2020-04-09 DIAGNOSIS — E875 Hyperkalemia: Secondary | ICD-10-CM | POA: Diagnosis present

## 2020-04-09 DIAGNOSIS — F329 Major depressive disorder, single episode, unspecified: Secondary | ICD-10-CM | POA: Diagnosis present

## 2020-04-09 DIAGNOSIS — N2581 Secondary hyperparathyroidism of renal origin: Secondary | ICD-10-CM | POA: Diagnosis present

## 2020-04-09 DIAGNOSIS — I161 Hypertensive emergency: Secondary | ICD-10-CM | POA: Diagnosis present

## 2020-04-09 DIAGNOSIS — N186 End stage renal disease: Secondary | ICD-10-CM | POA: Diagnosis present

## 2020-04-09 DIAGNOSIS — D696 Thrombocytopenia, unspecified: Secondary | ICD-10-CM | POA: Diagnosis present

## 2020-04-09 DIAGNOSIS — Z20822 Contact with and (suspected) exposure to covid-19: Secondary | ICD-10-CM | POA: Diagnosis present

## 2020-04-09 DIAGNOSIS — J810 Acute pulmonary edema: Secondary | ICD-10-CM | POA: Diagnosis present

## 2020-04-09 DIAGNOSIS — Z599 Problem related to housing and economic circumstances, unspecified: Secondary | ICD-10-CM | POA: Diagnosis not present

## 2020-04-09 DIAGNOSIS — J449 Chronic obstructive pulmonary disease, unspecified: Secondary | ICD-10-CM | POA: Diagnosis present

## 2020-04-09 DIAGNOSIS — Z8673 Personal history of transient ischemic attack (TIA), and cerebral infarction without residual deficits: Secondary | ICD-10-CM | POA: Diagnosis not present

## 2020-04-09 DIAGNOSIS — Z791 Long term (current) use of non-steroidal anti-inflammatories (NSAID): Secondary | ICD-10-CM | POA: Diagnosis not present

## 2020-04-09 DIAGNOSIS — D631 Anemia in chronic kidney disease: Secondary | ICD-10-CM | POA: Diagnosis present

## 2020-04-09 DIAGNOSIS — S069X9S Unspecified intracranial injury with loss of consciousness of unspecified duration, sequela: Secondary | ICD-10-CM | POA: Diagnosis not present

## 2020-04-09 DIAGNOSIS — E871 Hypo-osmolality and hyponatremia: Secondary | ICD-10-CM | POA: Diagnosis present

## 2020-04-09 DIAGNOSIS — E8770 Fluid overload, unspecified: Secondary | ICD-10-CM | POA: Diagnosis present

## 2020-04-09 DIAGNOSIS — G9349 Other encephalopathy: Secondary | ICD-10-CM | POA: Diagnosis present

## 2020-04-09 DIAGNOSIS — F909 Attention-deficit hyperactivity disorder, unspecified type: Secondary | ICD-10-CM | POA: Diagnosis present

## 2020-04-09 DIAGNOSIS — I120 Hypertensive chronic kidney disease with stage 5 chronic kidney disease or end stage renal disease: Secondary | ICD-10-CM | POA: Diagnosis present

## 2020-04-09 LAB — CBC WITH DIFFERENTIAL/PLATELET
Abs Immature Granulocytes: 0.04 10*3/uL (ref 0.00–0.07)
Basophils Absolute: 0 10*3/uL (ref 0.0–0.1)
Basophils Relative: 1 %
Eosinophils Absolute: 0.2 10*3/uL (ref 0.0–0.5)
Eosinophils Relative: 3 %
HCT: 22.7 % — ABNORMAL LOW (ref 39.0–52.0)
Hemoglobin: 7.2 g/dL — ABNORMAL LOW (ref 13.0–17.0)
Immature Granulocytes: 1 %
Lymphocytes Relative: 10 %
Lymphs Abs: 0.8 10*3/uL (ref 0.7–4.0)
MCH: 30.5 pg (ref 26.0–34.0)
MCHC: 31.7 g/dL (ref 30.0–36.0)
MCV: 96.2 fL (ref 80.0–100.0)
Monocytes Absolute: 0.7 10*3/uL (ref 0.1–1.0)
Monocytes Relative: 9 %
Neutro Abs: 5.5 10*3/uL (ref 1.7–7.7)
Neutrophils Relative %: 76 %
Platelets: 84 10*3/uL — ABNORMAL LOW (ref 150–400)
RBC: 2.36 MIL/uL — ABNORMAL LOW (ref 4.22–5.81)
RDW: 16 % — ABNORMAL HIGH (ref 11.5–15.5)
WBC: 7.2 10*3/uL (ref 4.0–10.5)
nRBC: 0 % (ref 0.0–0.2)

## 2020-04-09 LAB — URINALYSIS, ROUTINE W REFLEX MICROSCOPIC
Bilirubin Urine: NEGATIVE
Glucose, UA: 50 mg/dL — AB
Ketones, ur: NEGATIVE mg/dL
Nitrite: NEGATIVE
Protein, ur: >=300 mg/dL — AB
Specific Gravity, Urine: 1.009 (ref 1.005–1.030)
pH: 6 (ref 5.0–8.0)

## 2020-04-09 LAB — COMPREHENSIVE METABOLIC PANEL
ALT: 10 U/L (ref 0–44)
AST: 36 U/L (ref 15–41)
Albumin: 3.1 g/dL — ABNORMAL LOW (ref 3.5–5.0)
Alkaline Phosphatase: 75 U/L (ref 38–126)
Anion gap: 21 — ABNORMAL HIGH (ref 5–15)
BUN: 118 mg/dL — ABNORMAL HIGH (ref 6–20)
CO2: 17 mmol/L — ABNORMAL LOW (ref 22–32)
Calcium: 8.8 mg/dL — ABNORMAL LOW (ref 8.9–10.3)
Chloride: 94 mmol/L — ABNORMAL LOW (ref 98–111)
Creatinine, Ser: 18.93 mg/dL — ABNORMAL HIGH (ref 0.61–1.24)
GFR calc Af Amer: 3 mL/min — ABNORMAL LOW (ref >60)
GFR calc non Af Amer: 3 mL/min — ABNORMAL LOW (ref >60)
Glucose, Bld: 106 mg/dL — ABNORMAL HIGH (ref 70–99)
Potassium: 5.5 mmol/L — ABNORMAL HIGH (ref 3.5–5.1)
Sodium: 132 mmol/L — ABNORMAL LOW (ref 135–145)
Total Bilirubin: 0.8 mg/dL (ref 0.3–1.2)
Total Protein: 6.2 g/dL — ABNORMAL LOW (ref 6.5–8.1)

## 2020-04-09 LAB — MAGNESIUM: Magnesium: 2.2 mg/dL (ref 1.7–2.4)

## 2020-04-09 LAB — SARS CORONAVIRUS 2 (TAT 6-24 HRS): SARS Coronavirus 2: NEGATIVE

## 2020-04-09 MED ORDER — DARBEPOETIN ALFA 200 MCG/0.4ML IJ SOSY: 200.0000 ug | PREFILLED_SYRINGE | INTRAMUSCULAR | Status: DC

## 2020-04-09 MED ORDER — CALCITRIOL 0.5 MCG PO CAPS
0.5000 ug | ORAL_CAPSULE | ORAL | Status: DC
  Administered 2020-04-11 – 2020-04-13 (×2): 0.5 ug via ORAL
  Filled 2020-04-09 (×2): qty 1

## 2020-04-09 MED ORDER — PATIROMER SORBITEX CALCIUM 8.4 G PO PACK
8.4000 g | PACK | Freq: Every day | ORAL | Status: DC
  Administered 2020-04-09 – 2020-04-10 (×2): 8.4 g via ORAL
  Filled 2020-04-09 (×4): qty 1

## 2020-04-09 MED ORDER — CHLORHEXIDINE GLUCONATE CLOTH 2 % EX PADS
6.0000 | MEDICATED_PAD | Freq: Every day | CUTANEOUS | Status: DC
  Administered 2020-04-10 – 2020-04-14 (×3): 6 via TOPICAL

## 2020-04-09 MED ORDER — HYDROMORPHONE HCL 2 MG PO TABS
1.0000 mg | ORAL_TABLET | Freq: Four times a day (QID) | ORAL | Status: DC | PRN
  Administered 2020-04-10 – 2020-04-11 (×2): 2 mg via ORAL
  Filled 2020-04-09 (×2): qty 1

## 2020-04-09 MED ORDER — HEPARIN SODIUM (PORCINE) 1000 UNIT/ML IJ SOLN
INTRAMUSCULAR | Status: AC
  Administered 2020-04-09: 1000 [IU]
  Filled 2020-04-09: qty 4

## 2020-04-09 MED ORDER — AMLODIPINE BESYLATE 5 MG PO TABS
5.0000 mg | ORAL_TABLET | Freq: Every day | ORAL | Status: DC
  Administered 2020-04-10 – 2020-04-13 (×4): 5 mg via ORAL
  Filled 2020-04-09 (×4): qty 1

## 2020-04-09 MED ORDER — ISOSORB DINITRATE-HYDRALAZINE 20-37.5 MG PO TABS
2.0000 | ORAL_TABLET | Freq: Three times a day (TID) | ORAL | Status: DC
  Administered 2020-04-09 – 2020-04-15 (×18): 2 via ORAL
  Filled 2020-04-09 (×20): qty 2

## 2020-04-09 MED ORDER — CALCIUM ACETATE (PHOS BINDER) 667 MG PO CAPS
1334.0000 mg | ORAL_CAPSULE | Freq: Three times a day (TID) | ORAL | Status: DC
  Administered 2020-04-10 – 2020-04-12 (×6): 1334 mg via ORAL
  Filled 2020-04-09 (×6): qty 2

## 2020-04-09 NOTE — ED Notes (Signed)
Hold amlodipine per Dr. Vanita Panda, all other BP meds ok to give. Confirmed plan for dialysis this afternoon per MD
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
## 2020-04-09 NOTE — ED Notes (Signed)
Breakfast ordered 

## 2020-04-09 NOTE — ED Notes (Signed)
Lunch Tray Ordered @ 1026.
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
## 2020-04-09 NOTE — Progress Notes (Signed)
Pt stated he is not compliant with CPAP at home.  Pt refuses CPAP at this time.  RT instructed pt to have RN call if pt changes his mind about wearing CPAP.

## 2020-04-09 NOTE — ED Notes (Signed)
Md called back and states that he will come and see the patient.

## 2020-04-09 NOTE — Progress Notes (Signed)
CPAP started per order. Auto titrate.

## 2020-04-09 NOTE — ED Notes (Signed)
Dr. Marval Regal paged to [place], RN paged by Levada Dy

## 2020-04-09 NOTE — ED Notes (Signed)
Pt called out x2 saying that he could not breathe. The first time pt requested oxygen and was placed on [place] 2lpm for comfort and said, "No, I need a CPAP" machine. No CPAP ordered. Pt's mother then called out again stating "my son is going to pass out". Md paged.

## 2020-04-10 DIAGNOSIS — D696 Thrombocytopenia, unspecified: Secondary | ICD-10-CM

## 2020-04-10 DIAGNOSIS — N189 Chronic kidney disease, unspecified: Secondary | ICD-10-CM

## 2020-04-10 DIAGNOSIS — J810 Acute pulmonary edema: Secondary | ICD-10-CM

## 2020-04-10 DIAGNOSIS — I160 Hypertensive urgency: Secondary | ICD-10-CM

## 2020-04-10 DIAGNOSIS — D631 Anemia in chronic kidney disease: Secondary | ICD-10-CM

## 2020-04-10 LAB — CBC WITH DIFFERENTIAL/PLATELET
Abs Immature Granulocytes: 0.03 10*3/uL (ref 0.00–0.07)
Basophils Absolute: 0.1 10*3/uL (ref 0.0–0.1)
Basophils Relative: 1 %
Eosinophils Absolute: 0.1 10*3/uL (ref 0.0–0.5)
Eosinophils Relative: 2 %
HCT: 22.9 % — ABNORMAL LOW (ref 39.0–52.0)
Hemoglobin: 7.4 g/dL — ABNORMAL LOW (ref 13.0–17.0)
Immature Granulocytes: 0 %
Lymphocytes Relative: 10 %
Lymphs Abs: 0.7 10*3/uL (ref 0.7–4.0)
MCH: 30.8 pg (ref 26.0–34.0)
MCHC: 32.3 g/dL (ref 30.0–36.0)
MCV: 95.4 fL (ref 80.0–100.0)
Monocytes Absolute: 1 10*3/uL (ref 0.1–1.0)
Monocytes Relative: 14 %
Neutro Abs: 5 10*3/uL (ref 1.7–7.7)
Neutrophils Relative %: 73 %
Platelets: 100 10*3/uL — ABNORMAL LOW (ref 150–400)
RBC: 2.4 MIL/uL — ABNORMAL LOW (ref 4.22–5.81)
RDW: 16 % — ABNORMAL HIGH (ref 11.5–15.5)
WBC: 6.8 10*3/uL (ref 4.0–10.5)
nRBC: 0 % (ref 0.0–0.2)

## 2020-04-10 LAB — RENAL FUNCTION PANEL
Albumin: 3 g/dL — ABNORMAL LOW (ref 3.5–5.0)
Anion gap: 13 (ref 5–15)
BUN: 60 mg/dL — ABNORMAL HIGH (ref 6–20)
CO2: 25 mmol/L (ref 22–32)
Calcium: 8.6 mg/dL — ABNORMAL LOW (ref 8.9–10.3)
Chloride: 97 mmol/L — ABNORMAL LOW (ref 98–111)
Creatinine, Ser: 12.42 mg/dL — ABNORMAL HIGH (ref 0.61–1.24)
GFR calc Af Amer: 5 mL/min — ABNORMAL LOW (ref >60)
GFR calc non Af Amer: 5 mL/min — ABNORMAL LOW (ref >60)
Glucose, Bld: 98 mg/dL (ref 70–99)
Phosphorus: 6.9 mg/dL — ABNORMAL HIGH (ref 2.5–4.6)
Potassium: 4.8 mmol/L (ref 3.5–5.1)
Sodium: 135 mmol/L (ref 135–145)

## 2020-04-10 MED ORDER — CHLORHEXIDINE GLUCONATE CLOTH 2 % EX PADS
6.0000 | MEDICATED_PAD | Freq: Every day | CUTANEOUS | Status: DC
  Administered 2020-04-12 – 2020-04-15 (×4): 6 via TOPICAL

## 2020-04-10 MED ORDER — DARBEPOETIN ALFA 200 MCG/0.4ML IJ SOSY
200.0000 ug | PREFILLED_SYRINGE | INTRAMUSCULAR | Status: DC
  Administered 2020-04-11: 200 ug via INTRAVENOUS
  Filled 2020-04-10: qty 0.4
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
## 2020-04-10 NOTE — Progress Notes (Signed)
Navigator was in the room when Dr. Marval Regal showed patient his kidney ultrasound pictures per patient request and explained that his kidneys cannot eliminate toxins from his body. He explained patient's labs vs what normal labs should be. Navigator spoke with patient and his mother at length following Nephrologists visit. Patient continues to state that he does not think he needs dialysis, but agrees to be set up from ongoing OP HD care because "it's the only way I'm going to get out of this bed." Navigator explained that per report from Nephrologists, and based on his medical documentation, he needs ongoing HD if he wants to live. He again agrees to go to OP HD. His mother, elated at his decision, gave him a high-five. Patient smiled.  The fact that patient is uninsured will still prove to be problematic, though Navigator believes that he will qualify for Medicaid, given his dx of ESRD at this point. Patient's mother states she will do whatever it takes to get him out of the hospital as soon as medically safe, including making a payment plan for OP HD if this is an option to her. Navigator will discuss this with Director of Operations for [facility] once patient has his permanent access placed, as this is imperative for his medical acceptance to the facility.  Referral submitted to [facility] to request OP HD treatment for ESRD. Navigator will follow closely.  Alphonzo Cruise, [place] [facility] [phone]

## 2020-04-10 NOTE — Progress Notes (Signed)
Pt. Refused cpap. RT informed pt. To notify if he changes his mind. 
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
## 2020-04-11 LAB — RENAL FUNCTION PANEL
Albumin: 3 g/dL — ABNORMAL LOW (ref 3.5–5.0)
Anion gap: 16 — ABNORMAL HIGH (ref 5–15)
BUN: 75 mg/dL — ABNORMAL HIGH (ref 6–20)
CO2: 22 mmol/L (ref 22–32)
Calcium: 8.7 mg/dL — ABNORMAL LOW (ref 8.9–10.3)
Chloride: 96 mmol/L — ABNORMAL LOW (ref 98–111)
Creatinine, Ser: 15.72 mg/dL — ABNORMAL HIGH (ref 0.61–1.24)
GFR calc Af Amer: 4 mL/min — ABNORMAL LOW (ref >60)
GFR calc non Af Amer: 4 mL/min — ABNORMAL LOW (ref >60)
Glucose, Bld: 110 mg/dL — ABNORMAL HIGH (ref 70–99)
Phosphorus: 7.5 mg/dL — ABNORMAL HIGH (ref 2.5–4.6)
Potassium: 4.7 mmol/L (ref 3.5–5.1)
Sodium: 134 mmol/L — ABNORMAL LOW (ref 135–145)

## 2020-04-11 LAB — CBC WITH DIFFERENTIAL/PLATELET
Abs Immature Granulocytes: 0.04 10*3/uL (ref 0.00–0.07)
Basophils Absolute: 0 10*3/uL (ref 0.0–0.1)
Basophils Relative: 1 %
Eosinophils Absolute: 0.1 10*3/uL (ref 0.0–0.5)
Eosinophils Relative: 2 %
HCT: 23 % — ABNORMAL LOW (ref 39.0–52.0)
Hemoglobin: 7.3 g/dL — ABNORMAL LOW (ref 13.0–17.0)
Immature Granulocytes: 1 %
Lymphocytes Relative: 10 %
Lymphs Abs: 0.8 10*3/uL (ref 0.7–4.0)
MCH: 30.7 pg (ref 26.0–34.0)
MCHC: 31.7 g/dL (ref 30.0–36.0)
MCV: 96.6 fL (ref 80.0–100.0)
Monocytes Absolute: 1.3 10*3/uL — ABNORMAL HIGH (ref 0.1–1.0)
Monocytes Relative: 17 %
Neutro Abs: 5.4 10*3/uL (ref 1.7–7.7)
Neutrophils Relative %: 69 %
Platelets: 104 10*3/uL — ABNORMAL LOW (ref 150–400)
RBC: 2.38 MIL/uL — ABNORMAL LOW (ref 4.22–5.81)
RDW: 15.4 % (ref 11.5–15.5)
WBC: 7.6 10*3/uL (ref 4.0–10.5)
nRBC: 0 % (ref 0.0–0.2)

## 2020-04-11 MED ORDER — NEPRO/CARBSTEADY PO LIQD
237.0000 mL | Freq: Two times a day (BID) | ORAL | Status: DC
  Administered 2020-04-11 – 2020-04-14 (×3): 237 mL via ORAL

## 2020-04-11 MED ORDER — RENA-VITE PO TABS
1.0000 | ORAL_TABLET | Freq: Every day | ORAL | Status: DC
  Administered 2020-04-11 – 2020-04-14 (×4): 1 via ORAL
  Filled 2020-04-11 (×4): qty 1

## 2020-04-11 MED ORDER — HEPARIN SODIUM (PORCINE) 1000 UNIT/ML IJ SOLN
INTRAMUSCULAR | Status: AC
  Administered 2020-04-11: 3800 [IU]
  Filled 2020-04-11: qty 4

## 2020-04-11 MED ORDER — DARBEPOETIN ALFA 200 MCG/0.4ML IJ SOSY
PREFILLED_SYRINGE | INTRAMUSCULAR | Status: AC
  Filled 2020-04-11: qty 0.4

## 2020-04-11 MED ORDER — CALCITRIOL 0.5 MCG PO CAPS
ORAL_CAPSULE | ORAL | Status: AC
  Administered 2020-04-11: 0.5 ug via ORAL
  Filled 2020-04-11: qty 1

## 2020-04-11 NOTE — Progress Notes (Signed)
Pt refusing CPAP for tonight. Advised pt to notify for RT if he changes his mind. RT will continue to monitor
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
## 2020-04-11 NOTE — Progress Notes (Signed)
[facility] PROGRESS NOTE    Progress Note  Douglas [name]  NUU:[id number] DOB: 12-11-1986 DOA: 04/08/2020 PCP: Patient, No Pcp Per     Brief Narrative:   Douglas [name] is an 34 y.o. 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  Since discharge pt is reporting generalized weakness, cough and congestion.  Assessment/Plan:   Hypertensive renal disease, malignant, with renal failure/ Hypertensive urgency/end-stage renal disease Chest x-ray show mild pulmonary edema, patient is currently not requiring oxygen Nephrology on board, ongoing HD T/TH/S schedule Continue Coreg, amlodipine, BiDil, hydralazine prn  Chronic thrombocytopenia Discussed previously with heme-onc Dr Alvy Bimler, likely due to malignant hypertension Daily CBC  Anemia of chronic kidney disease Anemia panel showed iron 76, TIBC 248, sats 31, ferritin 647 Nephrology on board, planning for ESA Daily CBC      DVT prophylaxis: SCD Family Communication: Discussed extensively with patient during dialysis Status is: Inpatient  The patient will require care spanning > 2 midnights and should be moved to inpatient because:  Dispo: The patient is from: Home              Anticipated d/c is to: Home              Anticipated d/c date is: Pending outpatient dialysis set-up              Patient currently is not medically stable to d/c.        Code Status:     Code Status Orders  (From admission, onward)         Start     Ordered   04/08/20 2300  Full code  Continuous     04/08/20 2259        Code Status History     Date Active Date Inactive Code Status Order ID Comments User Context   03/31/2020 1709 04/06/2020 0136 Full Code [id number]  Ina Homes, MD ED   Advance Care Planning Activity        IV Access:    Peripheral IV   Procedures and diagnostic studies:   No results found.   Medical Consultants:    Nephrology  Anti-Infectives:   none  Subjective:    Lindsay Soulliere denies any new complaints, still with occasional muscle cramping.  Denies any chest pain, worsening shortness of breath/cough, abdominal pain, nausea/vomiting, fever/chills.  Objective:    Vitals:   04/11/20 1100 04/11/20 1114 04/11/20 1152 04/11/20 1254  BP: (!) 199/118 (!) 208/119 (!) 196/112 (!) 175/85  Pulse: 91 90 94 (!) 106  Resp:  20 20   Temp:  97.8 F (36.6 C) 98.6 F (37 C)   TempSrc:  Oral Oral   SpO2:  97% 96%   Weight:  95.4 kg    Height:       SpO2: 96 % O2 Flow Rate (L/min): 2 L/min   Intake/Output Summary (Last 24 hours) at 04/11/2020 1653 Last data filed at 04/11/2020 1114 Gross per 24 hour  Intake 460 ml  Output 3000 ml  Net -2540 ml   Filed Weights   04/09/20 2300 04/11/20 0701 04/11/20 1114  Weight: 99 kg 98.8 kg 95.4 kg  Exam:  General: NAD   Cardiovascular: S1, S2 present  Respiratory: CTAB  Abdomen: Soft, nontender, nondistended, bowel sounds present  Musculoskeletal: No bilateral pedal edema noted  Skin: Normal  Psychiatry: Normal mood   Data Reviewed:    Labs: Basic Metabolic Panel: Recent Labs  Lab 04/05/20 0737 04/05/20 0737 04/08/20 1658 04/08/20 1658 04/09/20 0349 04/09/20 0349 04/10/20 0759 04/11/20 0315  NA 132*  --  133*  --  132*  --  135 134*  K 4.7   < > 5.2*   < > 5.5*   < > 4.8 4.7  CL 94*  --  95*  --  94*  --  97* 96*  CO2 21*  --  21*  --  17*  --  25 22  GLUCOSE 103*  --  102*  --  106*  --  98 110*  BUN 82*  --  110*  --  118*  --  60* 75*  CREATININE 14.79*  --  17.80*  --  18.93*  --  12.42* 15.72*    CALCIUM 8.9  --  9.1  --  8.8*  --  8.6* 8.7*  MG  --   --   --   --  2.2  --   --   --   PHOS 5.9*  --   --   --   --   --  6.9* 7.5*   < > = values in this interval not displayed.   GFR Estimated Creatinine Clearance: 8.4 mL/min (A) (by C-G formula based on SCr of 15.72 mg/dL (H)). Liver Function Tests: Recent Labs  Lab 04/05/20 0737 04/09/20 0349 04/10/20 0759 04/11/20 0315  AST  --  36  --   --   ALT  --  10  --   --   ALKPHOS  --  75  --   --   BILITOT  --  0.8  --   --   PROT  --  6.2*  --   --   ALBUMIN 3.0* 3.1* 3.0* 3.0*   No results for input(s): LIPASE, AMYLASE in the last 168 hours. No results for input(s): AMMONIA in the last 168 hours. Coagulation profile No results for input(s): INR, PROTIME in the last 168 hours. COVID-19 Labs  No results for input(s): DDIMER, FERRITIN, LDH, CRP in the last 72 hours.  Lab Results  Component Value Date   SARSCOV2NAA NEGATIVE 04/08/2020   [place] NEGATIVE 03/31/2020    CBC: Recent Labs  Lab 04/05/20 1318 04/08/20 1658 04/09/20 0349 04/10/20 0759 04/11/20 0315  WBC 9.6 8.2 7.2 6.8 7.6  NEUTROABS 6.8  --  5.5 5.0 5.4  HGB 7.5* 7.7* 7.2* 7.4* 7.3*  HCT 23.9* 24.2* 22.7* 22.9* 23.0*  MCV 96.4 95.7 96.2 95.4 96.6  PLT 93* 96* 84* 100* 104*   Cardiac Enzymes: No results for input(s): CKTOTAL, CKMB, CKMBINDEX, TROPONINI in the last 168 hours. BNP (last 3 results) No results for input(s): PROBNP in the last 8760 hours. CBG: Recent Labs  Lab 04/04/20 2326 04/05/20 0430 04/05/20 1238 04/05/20 1619 04/05/20 1940  GLUCAP 100* 109* 110* 123* 95   D-Dimer: No results for input(s): DDIMER in the last 72 hours. Hgb A1c: No results for input(s): HGBA1C in the last 72 hours. Lipid Profile: No results for input(s): CHOL, HDL, LDLCALC, TRIG, CHOLHDL, LDLDIRECT in the last 72 hours. Thyroid function studies: No results for input(s): TSH, T4TOTAL, T3FREE, THYROIDAB in the last 72 hours.  Invalid input(s):  FREET3 Anemia work up: No results for input(s): VITAMINB12, FOLATE, FERRITIN, TIBC, IRON, RETICCTPCT in the last 72 hours. Sepsis Labs: Recent Labs  Lab 04/08/20 1658 04/09/20 0349 04/10/20 0759 04/11/20 0315  WBC 8.2 7.2 6.8 7.6   Microbiology Recent Results (from the past 240 hour(s))  SARS CORONAVIRUS 2 (TAT 6-24 HRS) Nasopharyngeal Nasopharyngeal Swab     Status: None   Collection Time: 04/08/20  8:30 PM   Specimen: Nasopharyngeal Swab  Result Value Ref Range Status   SARS Coronavirus 2 NEGATIVE NEGATIVE Final    Comment: (NOTE) SARS-CoV-2 target nucleic acids are NOT DETECTED. The SARS-CoV-2 RNA is generally detectable in upper and lower respiratory specimens during the acute phase of infection. Negative results do not preclude SARS-CoV-2 infection, do not rule out co-infections with other pathogens, and should not be used as the sole basis for treatment or other patient management decisions. Negative results must be combined with clinical observations, patient history, and epidemiological information. The expected result is Negative. Fact Sheet for Patients: SugarRoll.be Fact Sheet for Healthcare Providers: https://www.woods-mathews.com/ This test is not yet approved or cleared by the [place] FDA and  has been authorized for detection and/or diagnosis of SARS-CoV-2 by FDA under an Emergency Use Authorization (EUA). This EUA will remain  in effect (meaning this test can be used) for the duration of the COVID-19 declaration under Section 56 4(b)(1) of the Act, 21 U.S.C. section 360bbb-3(b)(1), unless the authorization is terminated or revoked sooner. Performed at [facility], [place] [address], [place], [place] [address]      Medications:   . amLODipine  5 mg Oral Daily  . calcitRIOL  0.5 mcg Oral Q T,Th,Sa-HD  . calcium acetate  1,334 mg Oral TID WC  . carvedilol  12.5 mg Oral BID WC  . Chlorhexidine Gluconate  Cloth  6 each Topical Q0600  . Chlorhexidine Gluconate Cloth  6 each Topical Q0600  . darbepoetin (ARANESP) injection - DIALYSIS  200 mcg Intravenous Q Thu-HD  . feeding supplement (NEPRO CARB STEADY)  237 mL Oral BID BM  . isosorbide-hydrALAZINE  2 tablet Oral TID  . multivitamin  1 tablet Oral QHS   Continuous Infusions:    LOS: 2 days   Alma Friendly  [facility]  04/11/2020, 4:53 PM

## 2020-04-11 NOTE — Progress Notes (Signed)
Nutrition Education Note  RD consulted for Renal Education, new ESRD. Mother at bedside and participate in education. 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 Provided dialysis nutrition education handouts from [facility] (kidney.org) to pt and his mom.  Reviewed food groups and provided written recommended serving sizes specifically determined for patient's current nutritional status.   Explained why diet restrictions are needed and provided lists of foods to limit/avoid that are high potassium, sodium, and phosphorus. Provided specific recommendations on safer alternatives of these foods. Strongly encouraged compliance of this diet.   Discussed importance of protein intake at each meal and snack. Provided examples of how to maximize protein intake throughout the day. Discussed need for fluid restriction with dialysis, importance of minimizing weight gain between HD treatments, and renal-friendly beverage options.  Encouraged pt to discuss specific diet questions/concerns with RD at HD outpatient facility. Teach back method used.  Expect good compliance.  Kerman Passey MS, RDN, LDN, CNSC RD Pager Number and Weekend/On-Call After Hours Pager Located in [place]

## 2020-04-12 DIAGNOSIS — N185 Chronic kidney disease, stage 5: Secondary | ICD-10-CM

## 2020-04-12 LAB — CBC WITH DIFFERENTIAL/PLATELET
Abs Immature Granulocytes: 0.05 10*3/uL (ref 0.00–0.07)
Basophils Absolute: 0.1 10*3/uL (ref 0.0–0.1)
Basophils Relative: 1 %
Eosinophils Absolute: 0.2 10*3/uL (ref 0.0–0.5)
Eosinophils Relative: 2 %
HCT: 24 % — ABNORMAL LOW (ref 39.0–52.0)
Hemoglobin: 7.4 g/dL — ABNORMAL LOW (ref 13.0–17.0)
Immature Granulocytes: 1 %
Lymphocytes Relative: 11 %
Lymphs Abs: 0.8 10*3/uL (ref 0.7–4.0)
MCH: 30.1 pg (ref 26.0–34.0)
MCHC: 30.8 g/dL (ref 30.0–36.0)
MCV: 97.6 fL (ref 80.0–100.0)
Monocytes Absolute: 1.3 10*3/uL — ABNORMAL HIGH (ref 0.1–1.0)
Monocytes Relative: 17 %
Neutro Abs: 5.4 10*3/uL (ref 1.7–7.7)
Neutrophils Relative %: 68 %
Platelets: 127 10*3/uL — ABNORMAL LOW (ref 150–400)
RBC: 2.46 MIL/uL — ABNORMAL LOW (ref 4.22–5.81)
RDW: 15.1 % (ref 11.5–15.5)
WBC: 7.8 10*3/uL (ref 4.0–10.5)
nRBC: 0 % (ref 0.0–0.2)

## 2020-04-12 LAB — RENAL FUNCTION PANEL
Albumin: 3.1 g/dL — ABNORMAL LOW (ref 3.5–5.0)
Anion gap: 15 (ref 5–15)
BUN: 43 mg/dL — ABNORMAL HIGH (ref 6–20)
CO2: 26 mmol/L (ref 22–32)
Calcium: 9.3 mg/dL (ref 8.9–10.3)
Chloride: 93 mmol/L — ABNORMAL LOW (ref 98–111)
Creatinine, Ser: 10.79 mg/dL — ABNORMAL HIGH (ref 0.61–1.24)
GFR calc Af Amer: 6 mL/min — ABNORMAL LOW (ref >60)
GFR calc non Af Amer: 6 mL/min — ABNORMAL LOW (ref >60)
Glucose, Bld: 107 mg/dL — ABNORMAL HIGH (ref 70–99)
Phosphorus: 7 mg/dL — ABNORMAL HIGH (ref 2.5–4.6)
Potassium: 4.5 mmol/L (ref 3.5–5.1)
Sodium: 134 mmol/L — ABNORMAL LOW (ref 135–145)

## 2020-04-12 MED ORDER — SEVELAMER CARBONATE 800 MG PO TABS
2400.0000 mg | ORAL_TABLET | Freq: Three times a day (TID) | ORAL | Status: DC
  Administered 2020-04-12 – 2020-04-15 (×8): 2400 mg via ORAL
  Filled 2020-04-12 (×8): qty 3

## 2020-04-12 NOTE — Progress Notes (Addendum)
   Patient is now amendable to permanent access.  He had refused prior to this.   We will order vein mapping.  He is left arm dominant. He is on HD currently via [facility] placed by IR     Assessment : ESRD requiring HD Plan is for right UE permanent access possibly Monday 04/15/20.   Roxy Horseman PA-C  I have seen and evaluated the patient. I agree with the PA note as documented above.  34 year old male with end-stage renal disease that Dr. Donzetta Matters saw last week in consultation for permanent dialysis.  He is left-handed and would prefer access in the right hand.  He is now agreeable to proceed with access.  We will order vein mapping which I discussed with him and his mom and can follow-up this weeked.  We will tentatively post him for Monday pending further discussions over the weekend.  Please restrict right arm.  Marty Heck, MD [facility] Office: [phone]

## 2020-04-12 NOTE — Progress Notes (Signed)
Patient has been accepted at [facility] on a TTS schedule with a seat time of 12:40pm. He needs to arrive to his appointments at 12:20pm. This acceptance is contingent upon patient going through with permanent access placement in the hospital prior to discharge. Renal Navigator has discussed patient's situation with Director of Operations for [facility]/Anne Brown. Patient's mother is willing to set up an out of pocket payment plan if needed in order for financial clearance to be eliminated from the equation of need for hospital discharge and start in the clinic. Per Anne Brown, as long as patient has completed the Medicaid application, she will allow him to start in the clinic.Per CM/W. [place]altney, who has discussed with Financial Counseling, patient is now pending Medicaid.  Patient needs to report to the clinic on his first day of treatment at 11:45am to complete intake paperwork. His mother will accompany him. Anne Brown has also been informed that should patient even display behaviors that require a sitter to be put in place during OP HD treatment, his mother has stated that she can arrange her schedule in order to serve as his sitter. Ms. Brown states that a sitter does not need to be put in place initially, but notes that his mother will be asked to sit with him, should he prove to need this.  Renal Navigator met with patient's mother (patient sleeping) to explain above, however, seat time was not given out yet. Navigator does not know the plan for permanent access placement, but feels this patient would not be a good candidate for a Saturday in-center start. I think it is reasonable to set Tuesday, 04/16/20 as his first day in the clinic, assuming he has had his access placed by then-Renal Navigator will continue to monitor.  ,  Elizabeth, LCSW Renal Navigator [phone] 
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
## 2020-04-12 NOTE — Progress Notes (Signed)
Instructed patient to use urinal for accurate output.

## 2020-04-12 NOTE — Progress Notes (Signed)
[facility] PROGRESS NOTE    Progress Note  Douglas [name]  RXV:[id number] DOB: Sep 04, 1986 DOA: 04/08/2020 PCP: Patient, No Pcp Per     Brief Narrative:   Douglas [name] is an 34 y.o. 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  Since discharge pt is reporting generalized weakness, cough and congestion.  Assessment/Plan:   Hypertensive renal disease, malignant, with renal failure/ Hypertensive urgency/end-stage renal disease Chest x-ray show mild pulmonary edema, patient is currently not requiring oxygen Nephrology on board, ongoing HD T/TH/S schedule Patient has been clipped, patient will need permanent access, vascular surgery consulted, plan for possible surgery for graft placement on 04/15/2020 Continue Coreg, amlodipine, BiDil, hydralazine prn  Chronic thrombocytopenia Discussed previously with heme-onc Dr Alvy Bimler, likely due to malignant hypertension Daily CBC  Anemia of chronic kidney disease Anemia panel showed iron 76, TIBC 248, sats 31, ferritin 647 Nephrology on board, planning for ESA Daily CBC      DVT prophylaxis: SCD Family Communication: Discussed extensively with patient and mother Status is: Inpatient  The patient will require care spanning > 2 midnights and should be moved to inpatient because:  Dispo: The patient is from: Home              Anticipated d/c is to: Home              Anticipated d/c date is: Outpatient dialysis has been set up, pending permanent access placement which will be most likely done on 04/15/2020              Patient currently is not  medically stable to d/c.    Code Status:     Code Status Orders  (From admission, onward)         Start     Ordered   04/08/20 2300  Full code  Continuous     04/08/20 2259        Code Status History    Date Active Date Inactive Code Status Order ID Comments User Context   03/31/2020 1709 04/06/2020 0136 Full Code [id number]  Ina Homes, MD ED   Advance Care Planning Activity        IV Access:    Peripheral IV   Procedures and diagnostic studies:   No results found.   Medical Consultants:    Nephrology  Vascular surgery  Anti-Infectives:   none  Subjective:    Douglas [name] denies any new complaints.  Objective:    Vitals:   04/12/20 0000 04/12/20 0444 04/12/20 0916 04/12/20 1653  BP: 130/64 (!) 146/90 138/87 (!) 152/93  Pulse: 83 88 81 96  Resp: 14 16 18 18   Temp: 99.3 F (37.4 C) 98.6 F (37 C) 98.8 F (37.1 C) 98.7 F (37.1 C)  TempSrc: Oral Oral Oral Oral  SpO2: 96% 99% 98% 100%  Weight:      Height:       SpO2: 100 % O2 Flow Rate (L/min): 2 L/min   Intake/Output Summary (Last 24 hours) at 04/12/2020 1723 Last data filed at 04/12/2020 1228 Gross per 24 hour  Intake 480 ml  Output 0 ml  Net 480 ml   [employer]  04/09/20 2300 04/11/20 0701 04/11/20 1114  Weight: 99 kg 98.8 kg 95.4 kg    Exam:  General: NAD   Cardiovascular: S1, S2 present  Respiratory: CTAB  Abdomen: Soft, nontender, nondistended, bowel sounds present  Musculoskeletal: No bilateral pedal edema noted  Skin: Normal  Psychiatry: Normal mood   Data Reviewed:    Labs: Basic Metabolic Panel: Recent Labs  Lab 04/08/20 1658 04/08/20 1658 04/09/20 0349 04/09/20 0349 04/10/20 0759 04/10/20 0759 04/11/20 0315 04/12/20 0356  NA 133*  --  132*  --  135  --  134* 134*  K 5.2*   < > 5.5*   < > 4.8   < > 4.7 4.5  CL 95*  --  94*  --  97*  --  96* 93*  CO2 21*  --  17*  --  25  --  22 26  GLUCOSE 102*  --  106*  --  98  --  110*  107*  BUN 110*  --  118*  --  60*  --  75* 43*  CREATININE 17.80*  --  18.93*  --  12.42*  --  15.72* 10.79*  CALCIUM 9.1  --  8.8*  --  8.6*  --  8.7* 9.3  MG  --   --  2.2  --   --   --   --   --   PHOS  --   --   --   --  6.9*  --  7.5* 7.0*   < > = values in this interval not displayed.   GFR Estimated Creatinine Clearance: 12.3 mL/min (A) (by C-G formula based on SCr of 10.79 mg/dL (H)). Liver Function Tests: Recent Labs  Lab 04/09/20 0349 04/10/20 0759 04/11/20 0315 04/12/20 0356  AST 36  --   --   --   ALT 10  --   --   --   ALKPHOS 75  --   --   --   BILITOT 0.8  --   --   --   PROT 6.2*  --   --   --   ALBUMIN 3.1* 3.0* 3.0* 3.1*   No results for input(s): LIPASE, AMYLASE in the last 168 hours. No results for input(s): AMMONIA in the last 168 hours. Coagulation profile No results for input(s): INR, PROTIME in the last 168 hours. COVID-19 Labs  No results for input(s): DDIMER, FERRITIN, LDH, CRP in the last 72 hours.  Lab Results  Component Value Date   SARSCOV2NAA NEGATIVE 04/08/2020   [place] NEGATIVE 03/31/2020    CBC: Recent Labs  Lab 04/08/20 1658 04/09/20 0349 04/10/20 0759 04/11/20 0315 04/12/20 0356  WBC 8.2 7.2 6.8 7.6 7.8  NEUTROABS  --  5.5 5.0 5.4 5.4  HGB 7.7* 7.2* 7.4* 7.3* 7.4*  HCT 24.2* 22.7* 22.9* 23.0* 24.0*  MCV 95.7 96.2 95.4 96.6 97.6  PLT 96* 84* 100* 104* 127*   Cardiac Enzymes: No results for input(s): CKTOTAL, CKMB, CKMBINDEX, TROPONINI in the last 168 hours. BNP (last 3 results) No results for input(s): PROBNP in the last 8760 hours. CBG: Recent Labs  Lab 04/05/20 1940  GLUCAP 95   D-Dimer: No results for input(s): DDIMER in the last 72 hours. Hgb A1c: No results for input(s): HGBA1C in the last 72 hours. Lipid Profile: No results for input(s): CHOL, HDL, LDLCALC, TRIG, CHOLHDL, LDLDIRECT in the last 72 hours. Thyroid function studies: No results for input(s): TSH, T4TOTAL, T3FREE, THYROIDAB in the last 72  hours.  Invalid input(s): FREET3 Anemia work up: No results for input(s): VITAMINB12, FOLATE, FERRITIN, TIBC, IRON, RETICCTPCT in the last 72 hours. Sepsis Labs: Recent Labs  Lab 04/09/20 0349 04/10/20 0759 04/11/20 0315 04/12/20 0356  WBC 7.2 6.8 7.6 7.8   Microbiology Recent Results (from the past 240 hour(s))  SARS CORONAVIRUS 2 (TAT 6-24 HRS) Nasopharyngeal Nasopharyngeal Swab     Status: None   Collection Time: 04/08/20  8:30 PM   Specimen: Nasopharyngeal Swab  Result Value Ref Range Status   SARS Coronavirus 2 NEGATIVE NEGATIVE Final    Comment: (NOTE) SARS-CoV-2 target nucleic acids are NOT DETECTED. The SARS-CoV-2 RNA is generally detectable in upper and lower respiratory specimens during the acute phase of infection. Negative results do not preclude SARS-CoV-2 infection, do not rule out co-infections with other pathogens, and should not be used as the sole basis for treatment or other patient management decisions. Negative results must be combined with clinical observations, patient history, and epidemiological information. The expected result is Negative. Fact Sheet for Patients: SugarRoll.be Fact Sheet for Healthcare Providers: https://www.woods-mathews.com/ This test is not yet approved or cleared by the [place] FDA and  has been authorized for detection and/or diagnosis of SARS-CoV-2 by FDA under an Emergency Use Authorization (EUA). This EUA will remain  in effect (meaning this test can be used) for the duration of the COVID-19 declaration under Section 56 4(b)(1) of the Act, 21 U.S.C. section 360bbb-3(b)(1), unless the authorization is terminated or revoked sooner. Performed at [facility], [place] [address] [place],  [address]      Medications:   . amLODipine  5 mg Oral Daily  . calcitRIOL  0.5 mcg Oral Q T,Th,Sa-HD  . carvedilol  12.5 mg Oral BID WC  . Chlorhexidine Gluconate Cloth  6 each  Topical Q0600  . Chlorhexidine Gluconate Cloth  6 each Topical Q0600  . darbepoetin (ARANESP) injection - DIALYSIS  200 mcg Intravenous Q Thu-HD  . feeding supplement (NEPRO CARB STEADY)  237 mL Oral BID BM  . isosorbide-hydrALAZINE  2 tablet Oral TID  . multivitamin  1 tablet Oral QHS  . sevelamer carbonate  2,400 mg Oral TID WC   Continuous Infusions:    LOS: 3 days   Alma Friendly  [facility]  04/12/2020, 5:23 PM

## 2020-04-13 ENCOUNTER — Inpatient Hospital Stay (HOSPITAL_COMMUNITY): Payer: Medicaid Other

## 2020-04-13 DIAGNOSIS — N186 End stage renal disease: Secondary | ICD-10-CM

## 2020-04-13 LAB — CBC WITH DIFFERENTIAL/PLATELET
Abs Immature Granulocytes: 0.04 10*3/uL (ref 0.00–0.07)
Basophils Absolute: 0.1 10*3/uL (ref 0.0–0.1)
Basophils Relative: 1 %
Eosinophils Absolute: 0.2 10*3/uL (ref 0.0–0.5)
Eosinophils Relative: 2 %
HCT: 25.1 % — ABNORMAL LOW (ref 39.0–52.0)
Hemoglobin: 7.8 g/dL — ABNORMAL LOW (ref 13.0–17.0)
Immature Granulocytes: 0 %
Lymphocytes Relative: 9 %
Lymphs Abs: 0.9 10*3/uL (ref 0.7–4.0)
MCH: 31 pg (ref 26.0–34.0)
MCHC: 31.1 g/dL (ref 30.0–36.0)
MCV: 99.6 fL (ref 80.0–100.0)
Monocytes Absolute: 1.5 10*3/uL — ABNORMAL HIGH (ref 0.1–1.0)
Monocytes Relative: 16 %
Neutro Abs: 6.6 10*3/uL (ref 1.7–7.7)
Neutrophils Relative %: 72 %
Platelets: 126 10*3/uL — ABNORMAL LOW (ref 150–400)
RBC: 2.52 MIL/uL — ABNORMAL LOW (ref 4.22–5.81)
RDW: 14.8 % (ref 11.5–15.5)
WBC: 9.3 10*3/uL (ref 4.0–10.5)
nRBC: 0 % (ref 0.0–0.2)

## 2020-04-13 LAB — RENAL FUNCTION PANEL
Albumin: 3.1 g/dL — ABNORMAL LOW (ref 3.5–5.0)
Anion gap: 16 — ABNORMAL HIGH (ref 5–15)
BUN: 63 mg/dL — ABNORMAL HIGH (ref 6–20)
CO2: 25 mmol/L (ref 22–32)
Calcium: 9 mg/dL (ref 8.9–10.3)
Chloride: 93 mmol/L — ABNORMAL LOW (ref 98–111)
Creatinine, Ser: 14.39 mg/dL — ABNORMAL HIGH (ref 0.61–1.24)
GFR calc Af Amer: 5 mL/min — ABNORMAL LOW (ref >60)
GFR calc non Af Amer: 4 mL/min — ABNORMAL LOW (ref >60)
Glucose, Bld: 113 mg/dL — ABNORMAL HIGH (ref 70–99)
Phosphorus: 8 mg/dL — ABNORMAL HIGH (ref 2.5–4.6)
Potassium: 4.5 mmol/L (ref 3.5–5.1)
Sodium: 134 mmol/L — ABNORMAL LOW (ref 135–145)

## 2020-04-13 MED ORDER — HEPARIN SODIUM (PORCINE) 1000 UNIT/ML IJ SOLN
INTRAMUSCULAR | Status: AC
  Administered 2020-04-13: 4000 [IU]
  Filled 2020-04-13: qty 4

## 2020-04-13 MED ORDER — LIDOCAINE HCL (PF) 1 % IJ SOLN: 5.0000 mL | INTRAMUSCULAR | Status: DC | PRN

## 2020-04-13 MED ORDER — CALCITRIOL 0.5 MCG PO CAPS
ORAL_CAPSULE | ORAL | Status: AC
  Administered 2020-04-13: 0.5 ug
  Filled 2020-04-13: qty 1

## 2020-04-13 MED ORDER — SODIUM CHLORIDE 0.9 % IV SOLN: 100.0000 mL | INTRAVENOUS | Status: DC | PRN

## 2020-04-13 MED ORDER — ALTEPLASE 2 MG IJ SOLR: 2.0000 mg | Freq: Once | INTRAMUSCULAR | Status: DC | PRN

## 2020-04-13 MED ORDER — LIDOCAINE-PRILOCAINE 2.5-2.5 % EX CREA: 1.0000 "application " | TOPICAL_CREAM | CUTANEOUS | Status: DC | PRN

## 2020-04-13 MED ORDER — AMLODIPINE BESYLATE 10 MG PO TABS
10.0000 mg | ORAL_TABLET | Freq: Every day | ORAL | Status: DC
  Administered 2020-04-14 – 2020-04-15 (×2): 10 mg via ORAL
  Filled 2020-04-13 (×2): qty 1

## 2020-04-13 MED ORDER — PENTAFLUOROPROP-TETRAFLUOROETH EX AERO: 1.0000 "application " | INHALATION_SPRAY | CUTANEOUS | Status: DC | PRN

## 2020-04-13 NOTE — Progress Notes (Signed)
Upper vein mapping    has been completed. Preliminary results can be found under CV proc through chart review. June Leap, BS, RDMS, RVT

## 2020-04-13 NOTE — Procedures (Signed)
I was present at this dialysis session. I have reviewed the session itself and made appropriate changes.   Vital signs in last 24 hours:  Temp:  [98.2 F (36.8 C)-98.7 F (37.1 C)] 98.2 F (36.8 C) (04/17 0725) Pulse Rate:  [91-101] 100 (04/17 1015) Resp:  [16-30] 18 (04/17 0845) BP: (152-199)/(93-126) 184/108 (04/17 1015) SpO2:  [99 %-100 %] 100 % (04/17 0845) Weight:  [100.6 kg] 100.6 kg (04/17 0725) Weight change:  Filed Weights   04/11/20 0701 04/11/20 1114 04/13/20 0725  Weight: 98.8 kg 95.4 kg 100.6 kg    Recent Labs  Lab 04/13/20 0753  NA 134*  K 4.5  CL 93*  CO2 25  GLUCOSE 113*  BUN 63*  CREATININE 14.39*  CALCIUM 9.0  PHOS 8.0*    Recent Labs  Lab 04/11/20 0315 04/12/20 0356 04/13/20 0752  WBC 7.6 7.8 9.3  NEUTROABS 5.4 5.4 6.6  HGB 7.3* 7.4* 7.8*  HCT 23.0* 24.0* 25.1*  MCV 96.6 97.6 99.6  PLT 104* 127* 126*    Scheduled Meds: . amLODipine  5 mg Oral Daily  . calcitRIOL      . calcitRIOL  0.5 mcg Oral Q T,Th,Sa-HD  . carvedilol  12.5 mg Oral BID WC  . Chlorhexidine Gluconate Cloth  6 each Topical Q0600  . Chlorhexidine Gluconate Cloth  6 each Topical Q0600  . darbepoetin (ARANESP) injection - DIALYSIS  200 mcg Intravenous Q Thu-HD  . feeding supplement (NEPRO CARB STEADY)  237 mL Oral BID BM  . heparin sodium (porcine)      . isosorbide-hydrALAZINE  2 tablet Oral TID  . multivitamin  1 tablet Oral QHS  . sevelamer carbonate  2,400 mg Oral TID WC   Continuous Infusions: . sodium chloride    . sodium chloride     PRN Meds:.sodium chloride, sodium chloride, acetaminophen **OR** acetaminophen, albuterol, alteplase, hydrALAZINE, HYDROmorphone, lidocaine (PF), lidocaine-prilocaine, ondansetron **OR** ondansetron (ZOFRAN) IV, pentafluoroprop-tetrafluoroeth, polyethylene glycol    Assessment and Plan: 1. ESRD- continue with HD on TTS schedule 2. Vascular access- for RUE access placement on 04/15/20 per Dr. Carlis Abbott 3. HTN/BP- poorly controlled.  Will  increase amlodipine to 10 mg qhs and likely need to increase coreg as well.  Will cont to follow 4. Anemia of ESRD- on Aranesp q Thursday 5. 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  iPTH 315. 6. Traumatic brain injury- ongoing behavioral and cognitive issues.  Would greatly benefit from neuropsych evaluation 7. Disposition- has outpatient HD at [facility] on TTS schedule once he gets permanent access placement.  Donetta Potts,  MD 04/13/2020, 10:37 AM

## 2020-04-13 NOTE — Plan of Care (Signed)
  Problem: Nutrition: Goal: Adequate nutrition will be maintained Outcome: Adequate for Discharge   

## 2020-04-13 NOTE — Plan of Care (Signed)
  Problem: Clinical Measurements: Goal: Ability to maintain clinical measurements within normal limits will improve Outcome: Progressing   

## 2020-04-13 NOTE — Progress Notes (Signed)
[facility] PROGRESS NOTE    Progress Note  Douglas [name]  WCH:[id number] DOB: 1986-09-11 DOA: 04/08/2020 PCP: Patient, No Pcp Per     Brief Narrative:   Douglas [name] is an 34 y.o. 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  Since discharge pt is reporting generalized weakness, cough and congestion.  Assessment/Plan:   Hypertensive renal disease, malignant, with renal failure/ Hypertensive urgency/end-stage renal disease Chest x-ray show mild pulmonary edema, patient is currently not requiring oxygen Nephrology on board, ongoing HD T/TH/S schedule Patient has been clipped, patient will need permanent access, vascular surgery consulted, plan for possible surgery for graft placement on 04/15/2020 Continue Coreg, increase amlodipine, BiDil, hydralazine prn  Chronic thrombocytopenia Discussed previously with heme-onc Dr Alvy Bimler, likely due to malignant hypertension Daily CBC  Anemia of chronic kidney disease Anemia panel showed iron 76, TIBC 248, sats 31, ferritin 647 Nephrology on board, planning for ESA Daily CBC      DVT prophylaxis: SCD Family Communication: Discussed extensively with patient and mother Status is: Inpatient  The patient will require care spanning > 2 midnights and should be moved to inpatient because:  Dispo: The patient is from: Home              Anticipated d/c is to: Home              Anticipated d/c date is: Outpatient dialysis has been set up, pending permanent access placement which will be most likely done on 04/15/2020              Patient  currently is not medically stable to d/c.    Code Status:     Code Status Orders  (From admission, onward)         Start     Ordered   04/08/20 2300  Full code  Continuous     04/08/20 2259        Code Status History    Date Active Date Inactive Code Status Order ID Comments User Context   03/31/2020 1709 04/06/2020 0136 Full Code [id number]  Ina Homes, MD ED   Advance Care Planning Activity        IV Access:    Peripheral IV   Procedures and diagnostic studies:   VAS [place] UPPER EXT VEIN MAPPING (PRE-OP AVF)  Result Date: 04/13/2020 UPPER EXTREMITY VEIN MAPPING  Indications: Pre-access. Performing Technologist: June Leap RDMS, RVT  Examination Guidelines: A complete evaluation includes B-mode imaging, spectral Doppler, color Doppler, and power Doppler as needed of all accessible portions of each vessel. Bilateral testing is considered an integral part of a complete examination. Limited examinations for reoccurring indications may be performed as noted. +-----------------+-------------+----------+---------+ Right Cephalic   Diameter (cm)Depth (cm)Findings  +-----------------+-------------+----------+---------+ Shoulder             0.24                         +-----------------+-------------+----------+---------+ Mid upper arm        0.12        0.32   branching +-----------------+-------------+----------+---------+ Dist upper arm       0.09                         +-----------------+-------------+----------+---------+  Antecubital fossa    0.21                         +-----------------+-------------+----------+---------+ Prox forearm         0.20        0.30             +-----------------+-------------+----------+---------+ Mid forearm          0.26        0.35   branching +-----------------+-------------+----------+---------+ Dist forearm         0.22                         +-----------------+-------------+----------+---------+  +-----------------+-------------+----------+---------+ Right Basilic    Diameter (cm)Depth (cm)Findings  +-----------------+-------------+----------+---------+ Prox upper arm       0.26        1.29             +-----------------+-------------+----------+---------+ Mid upper arm        0.33        1.46             +-----------------+-------------+----------+---------+ Dist upper arm       0.36        0.83             +-----------------+-------------+----------+---------+ Antecubital fossa    0.34        0.75   branching +-----------------+-------------+----------+---------+ +-----------------+-------------+----------+---------+ Left Cephalic    Diameter (cm)Depth (cm)Findings  +-----------------+-------------+----------+---------+ Shoulder             0.39        0.65             +-----------------+-------------+----------+---------+ Mid upper arm        0.28                         +-----------------+-------------+----------+---------+ Dist upper arm       0.35        0.37   branching +-----------------+-------------+----------+---------+ Antecubital fossa    0.35                         +-----------------+-------------+----------+---------+ Prox forearm         0.14               branching +-----------------+-------------+----------+---------+ Mid forearm          0.11                         +-----------------+-------------+----------+---------+ +-----------------+-------------+----------+---------+ Left Basilic     Diameter (cm)Depth (cm)Findings  +-----------------+-------------+----------+---------+ Prox upper arm       0.50        1.24             +-----------------+-------------+----------+---------+ Mid upper arm        0.43        0.92   branching +-----------------+-------------+----------+---------+ Dist upper arm       0.33        0.45   branching +-----------------+-------------+----------+---------+ Antecubital fossa     0.32               branching +-----------------+-------------+----------+---------+ *See table(s) above for measurements and observations.  Diagnosing physician: Monica Martinez MD Electronically signed by Monica Martinez MD on 04/13/2020 at 3:15:13 PM.    Final      Medical Consultants:    Nephrology  Vascular surgery  Anti-Infectives:   none  Subjective:    Douglas [name] met at bedside during HD, denies any new complaints  Objective:    Vitals:   04/13/20 1115 04/13/20 1130 04/13/20 1412 04/13/20 1415  BP: (!) 190/123 (!) 198/110 (!) 181/113 (!) 183/111  Pulse: 92 96 94 92  Resp:  (!) 29 18   Temp:  98.2 F (36.8 C) 99 F (37.2 C)   TempSrc:  Oral Oral   SpO2:  100% 98% 97%  Weight:  97.2 kg    Height:       SpO2: 97 % O2 Flow Rate (L/min): 2 L/min   Intake/Output Summary (Last 24 hours) at 04/13/2020 1623 Last data filed at 04/13/2020 1414 Gross per 24 hour  Intake 480 ml  Output 2914 ml  Net -2434 ml   Filed Weights   04/11/20 1114 04/13/20 0725 04/13/20 1130  Weight: 95.4 kg 100.6 kg 97.2 kg    Exam:  General: NAD   Cardiovascular: S1, S2 present  Respiratory: CTAB  Abdomen: Soft, nontender, nondistended, bowel sounds present  Musculoskeletal: No bilateral pedal edema noted  Skin: Normal  Psychiatry: Normal mood   Data Reviewed:    Labs: Basic Metabolic Panel: Recent Labs  Lab 04/09/20 0349 04/09/20 0349 04/10/20 0759 04/10/20 0759 04/11/20 0315 04/11/20 0315 04/12/20 0356 04/13/20 0753  NA 132*  --  135  --  134*  --  134* 134*  K 5.5*   < > 4.8   < > 4.7   < > 4.5 4.5  CL 94*  --  97*  --  96*  --  93* 93*  CO2 17*  --  25  --  22  --  26 25  GLUCOSE 106*  --  98  --  110*  --  107* 113*  BUN 118*  --  60*  --  75*  --  43* 63*  CREATININE 18.93*  --  12.42*  --  15.72*  --  10.79* 14.39*  CALCIUM 8.8*  --  8.6*  --  8.7*  --  9.3 9.0  MG 2.2  --   --   --   --   --   --   --   PHOS  --   --  6.9*  --   7.5*  --  7.0* 8.0*   < > = values in this interval not displayed.   GFR Estimated Creatinine Clearance: 9.2 mL/min (A) (by C-G formula based on SCr of 14.39 mg/dL (H)). Liver Function Tests: Recent Labs  Lab 04/09/20 0349 04/10/20 0759 04/11/20 0315 04/12/20 0356 04/13/20 0753  AST 36  --   --   --   --   ALT 10  --   --   --   --   ALKPHOS 75  --   --   --   --   BILITOT 0.8  --   --   --   --   PROT 6.2*  --   --   --   --   ALBUMIN 3.1* 3.0* 3.0* 3.1* 3.1*   No results for input(s): LIPASE, AMYLASE in the last 168 hours. No results for input(s): AMMONIA in the last 168 hours. Coagulation profile No results for input(s): INR, PROTIME in the last 168 hours. COVID-19 Labs  No results for input(s): DDIMER, FERRITIN, LDH, CRP in the last 72 hours.  Lab Results  Component Value Date   SARSCOV2NAA NEGATIVE 04/08/2020   SARSCOV2NAA NEGATIVE  03/31/2020    CBC: Recent Labs  Lab 04/09/20 0349 04/10/20 0759 04/11/20 0315 04/12/20 0356 04/13/20 0752  WBC 7.2 6.8 7.6 7.8 9.3  NEUTROABS 5.5 5.0 5.4 5.4 6.6  HGB 7.2* 7.4* 7.3* 7.4* 7.8*  HCT 22.7* 22.9* 23.0* 24.0* 25.1*  MCV 96.2 95.4 96.6 97.6 99.6  PLT 84* 100* 104* 127* 126*   Cardiac Enzymes: No results for input(s): CKTOTAL, CKMB, CKMBINDEX, TROPONINI in the last 168 hours. BNP (last 3 results) No results for input(s): PROBNP in the last 8760 hours. CBG: No results for input(s): GLUCAP in the last 168 hours. D-Dimer: No results for input(s): DDIMER in the last 72 hours. Hgb A1c: No results for input(s): HGBA1C in the last 72 hours. Lipid Profile: No results for input(s): CHOL, HDL, LDLCALC, TRIG, CHOLHDL, LDLDIRECT in the last 72 hours. Thyroid function studies: No results for input(s): TSH, T4TOTAL, T3FREE, THYROIDAB in the last 72 hours.  Invalid input(s): FREET3 Anemia work up: No results for input(s): VITAMINB12, FOLATE, FERRITIN, TIBC, IRON, RETICCTPCT in the last 72 hours. Sepsis Labs: Recent Labs    Lab 04/10/20 0759 04/11/20 0315 04/12/20 0356 04/13/20 0752  WBC 6.8 7.6 7.8 9.3   Microbiology Recent Results (from the past 240 hour(s))  SARS CORONAVIRUS 2 (TAT 6-24 HRS) Nasopharyngeal Nasopharyngeal Swab     Status: None   Collection Time: 04/08/20  8:30 PM   Specimen: Nasopharyngeal Swab  Result Value Ref Range Status   SARS Coronavirus 2 NEGATIVE NEGATIVE Final    Comment: (NOTE) SARS-CoV-2 target nucleic acids are NOT DETECTED. The SARS-CoV-2 RNA is generally detectable in upper and lower respiratory specimens during the acute phase of infection. Negative results do not preclude SARS-CoV-2 infection, do not rule out co-infections with other pathogens, and should not be used as the sole basis for treatment or other patient management decisions. Negative results must be combined with clinical observations, patient history, and epidemiological information. The expected result is Negative. Fact Sheet for Patients: SugarRoll.be Fact Sheet for Healthcare Providers: https://www.woods-mathews.com/ This test is not yet approved or cleared by the [place] FDA and  has been authorized for detection and/or diagnosis of SARS-CoV-2 by FDA under an Emergency Use Authorization (EUA). This EUA will remain  in effect (meaning this test can be used) for the duration of the COVID-19 declaration under Section 56 4(b)(1) of the Act, 21 U.S.C. section 360bbb-3(b)(1), unless the authorization is terminated or revoked sooner. Performed at [facility], [place] [address] [place], [place] [address]      Medications:   . amLODipine  5 mg Oral Daily  . calcitRIOL  0.5 mcg Oral Q T,Th,Sa-HD  . carvedilol  12.5 mg Oral BID WC  . Chlorhexidine Gluconate Cloth  6 each Topical Q0600  . Chlorhexidine Gluconate Cloth  6 each Topical Q0600  . darbepoetin (ARANESP) injection - DIALYSIS  200 mcg Intravenous Q Thu-HD  . feeding supplement (NEPRO  CARB STEADY)  237 mL Oral BID BM  . isosorbide-hydrALAZINE  2 tablet Oral TID  . multivitamin  1 tablet Oral QHS  . sevelamer carbonate  2,400 mg Oral TID WC   Continuous Infusions:    LOS: 4 days   Alma Friendly  [facility]  04/13/2020, 4:23 PM

## 2020-04-14 LAB — RENAL FUNCTION PANEL
Albumin: 3 g/dL — ABNORMAL LOW (ref 3.5–5.0)
Anion gap: 13 (ref 5–15)
BUN: 35 mg/dL — ABNORMAL HIGH (ref 6–20)
CO2: 25 mmol/L (ref 22–32)
Calcium: 8.8 mg/dL — ABNORMAL LOW (ref 8.9–10.3)
Chloride: 98 mmol/L (ref 98–111)
Creatinine, Ser: 10.21 mg/dL — ABNORMAL HIGH (ref 0.61–1.24)
GFR calc Af Amer: 7 mL/min — ABNORMAL LOW (ref >60)
GFR calc non Af Amer: 6 mL/min — ABNORMAL LOW (ref >60)
Glucose, Bld: 101 mg/dL — ABNORMAL HIGH (ref 70–99)
Phosphorus: 6 mg/dL — ABNORMAL HIGH (ref 2.5–4.6)
Potassium: 4.2 mmol/L (ref 3.5–5.1)
Sodium: 136 mmol/L (ref 135–145)

## 2020-04-14 LAB — CBC WITH DIFFERENTIAL/PLATELET
Abs Immature Granulocytes: 0.04 10*3/uL (ref 0.00–0.07)
Basophils Absolute: 0.1 10*3/uL (ref 0.0–0.1)
Basophils Relative: 1 %
Eosinophils Absolute: 0.3 10*3/uL (ref 0.0–0.5)
Eosinophils Relative: 3 %
HCT: 25.7 % — ABNORMAL LOW (ref 39.0–52.0)
Hemoglobin: 7.9 g/dL — ABNORMAL LOW (ref 13.0–17.0)
Immature Granulocytes: 1 %
Lymphocytes Relative: 14 %
Lymphs Abs: 1.2 10*3/uL (ref 0.7–4.0)
MCH: 30.7 pg (ref 26.0–34.0)
MCHC: 30.7 g/dL (ref 30.0–36.0)
MCV: 100 fL (ref 80.0–100.0)
Monocytes Absolute: 1.3 10*3/uL — ABNORMAL HIGH (ref 0.1–1.0)
Monocytes Relative: 15 %
Neutro Abs: 5.7 10*3/uL (ref 1.7–7.7)
Neutrophils Relative %: 66 %
Platelets: 149 10*3/uL — ABNORMAL LOW (ref 150–400)
RBC: 2.57 MIL/uL — ABNORMAL LOW (ref 4.22–5.81)
RDW: 14.9 % (ref 11.5–15.5)
WBC: 8.5 10*3/uL (ref 4.0–10.5)
nRBC: 0 % (ref 0.0–0.2)

## 2020-04-14 MED ORDER — CARVEDILOL 25 MG PO TABS
25.0000 mg | ORAL_TABLET | Freq: Two times a day (BID) | ORAL | Status: DC
  Administered 2020-04-14 – 2020-04-15 (×2): 25 mg via ORAL
  Filled 2020-04-14 (×2): qty 1

## 2020-04-14 MED ORDER — SODIUM CHLORIDE 0.9 % IV SOLN
1.5000 g | INTRAVENOUS | Status: AC
  Administered 2020-04-15: 1.5 g via INTRAVENOUS
  Filled 2020-04-14 (×2): qty 1.5

## 2020-04-14 NOTE — Progress Notes (Signed)
Received call from security ,that the patient ,together with his mother and father are walking on [address].

## 2020-04-14 NOTE — Plan of Care (Signed)
°  Problem: Coping: °Goal: Level of anxiety will decrease °Outcome: Progressing °  °

## 2020-04-14 NOTE — Progress Notes (Addendum)
    ESRD  Vein mapping complete +-----------------+-------------+----------+---------+  Right Cephalic  Diameter (cm)Depth (cm)Findings   +-----------------+-------------+----------+---------+  Shoulder       0.24               +-----------------+-------------+----------+---------+  Mid upper arm    0.12     0.32  branching  +-----------------+-------------+----------+---------+  Dist upper arm    0.09               +-----------------+-------------+----------+---------+  Antecubital fossa  0.21               +-----------------+-------------+----------+---------+  Prox forearm     0.20     0.30         +-----------------+-------------+----------+---------+  Mid forearm     0.26     0.35  branching  +-----------------+-------------+----------+---------+  Dist forearm     0.22               +-----------------+-------------+----------+---------+   +-----------------+-------------+----------+---------+  Right Basilic  Diameter (cm)Depth (cm)Findings   +-----------------+-------------+----------+---------+  Prox upper arm    0.26     1.29         +-----------------+-------------+----------+---------+  Mid upper arm    0.33     1.46         +-----------------+-------------+----------+---------+  Dist upper arm    0.36     0.83         +-----------------+-------------+----------+---------+  Antecubital fossa  0.34     0.75  branching  +-----------------+-------------+----------+---------+   +-----------------+-------------+----------+---------+  Left Cephalic  Diameter (cm)Depth (cm)Findings   +-----------------+-------------+----------+---------+  Shoulder       0.39     0.65         +-----------------+-------------+----------+---------+  Mid upper arm    0.28                +-----------------+-------------+----------+---------+  Dist upper arm    0.35     0.37  branching  +-----------------+-------------+----------+---------+  Antecubital fossa  0.35               +-----------------+-------------+----------+---------+  Prox forearm     0.14        branching  +-----------------+-------------+----------+---------+  Mid forearm     0.11               +-----------------+-------------+----------+---------+   +-----------------+-------------+----------+---------+  Left Basilic   Diameter (cm)Depth (cm)Findings   +-----------------+-------------+----------+---------+  Prox upper arm    0.50     1.24         +-----------------+-------------+----------+---------+  Mid upper arm    0.43     0.92  branching  +-----------------+-------------+----------+---------+  Dist upper arm    0.33     0.45  branching  +-----------------+-------------+----------+---------+  Antecubital fossa  0.32        branching  +-----------------+-------------+----------+---------+  He is left hand dominant and has acceptable Left basilic, as well as right Cephalic and Basilic veins.  Plan NPO past MN for right basilic AV fistula creation 04/15/20  Roxy Horseman PA-C  I have seen and evaluated the patient. I agree with the PA note as documented above. Plan right arm AVF tomorrow with Dr. Donnetta Hutching.  Appears to have nice basilic vein on vein mapping.  Details of surgery discussed with patient and mother this am.  Discussed typically done in two stages if use basilic vein.  Please keep NPO after midnight.  Restrict right arm.   Marty Heck, MD [facility] Office: [phone]

## 2020-04-14 NOTE — H&P (View-Only) (Signed)
    ESRD  Vein mapping complete +-----------------+-------------+----------+---------+  Right Cephalic  Diameter (cm)Depth (cm)Findings   +-----------------+-------------+----------+---------+  Shoulder       0.24               +-----------------+-------------+----------+---------+  Mid upper arm    0.12     0.32  branching  +-----------------+-------------+----------+---------+  Dist upper arm    0.09               +-----------------+-------------+----------+---------+  Antecubital fossa  0.21               +-----------------+-------------+----------+---------+  Prox forearm     0.20     0.30         +-----------------+-------------+----------+---------+  Mid forearm     0.26     0.35  branching  +-----------------+-------------+----------+---------+  Dist forearm     0.22               +-----------------+-------------+----------+---------+   +-----------------+-------------+----------+---------+  Right Basilic  Diameter (cm)Depth (cm)Findings   +-----------------+-------------+----------+---------+  Prox upper arm    0.26     1.29         +-----------------+-------------+----------+---------+  Mid upper arm    0.33     1.46         +-----------------+-------------+----------+---------+  Dist upper arm    0.36     0.83         +-----------------+-------------+----------+---------+  Antecubital fossa  0.34     0.75  branching  +-----------------+-------------+----------+---------+   +-----------------+-------------+----------+---------+  Left Cephalic  Diameter (cm)Depth (cm)Findings   +-----------------+-------------+----------+---------+  Shoulder       0.39     0.65         +-----------------+-------------+----------+---------+  Mid upper arm    0.28                +-----------------+-------------+----------+---------+  Dist upper arm    0.35     0.37  branching  +-----------------+-------------+----------+---------+  Antecubital fossa  0.35               +-----------------+-------------+----------+---------+  Prox forearm     0.14        branching  +-----------------+-------------+----------+---------+  Mid forearm     0.11               +-----------------+-------------+----------+---------+   +-----------------+-------------+----------+---------+  Left Basilic   Diameter (cm)Depth (cm)Findings   +-----------------+-------------+----------+---------+  Prox upper arm    0.50     1.24         +-----------------+-------------+----------+---------+  Mid upper arm    0.43     0.92  branching  +-----------------+-------------+----------+---------+  Dist upper arm    0.33     0.45  branching  +-----------------+-------------+----------+---------+  Antecubital fossa  0.32        branching  +-----------------+-------------+----------+---------+  He is left hand dominant and has acceptable Left basilic, as well as right Cephalic and Basilic veins.  Plan NPO past MN for right basilic AV fistula creation 04/15/20  Roxy Horseman PA-C  I have seen and evaluated the patient. I agree with the PA note as documented above. Plan right arm AVF tomorrow with Dr. Donnetta Hutching.  Appears to have nice basilic vein on vein mapping.  Details of surgery discussed with patient and mother this am.  Discussed typically done in two stages if use basilic vein.  Please keep NPO after midnight.  Restrict right arm.   Marty Heck, MD [facility] Office: [phone]

## 2020-04-14 NOTE — Progress Notes (Addendum)
[name] Progress Note   Subjective:  Seen in room - feels fine today. No overnight issues. Plan is for permanent access placement tomorrow and hopefully will be able to discharge early next week.  Objective Vitals:   04/13/20 1415 04/13/20 1734 04/13/20 2144 04/14/20 0611  BP: (!) 183/111 (!) 144/73 (!) 143/86 (!) 168/114  Pulse: 92 90 90 93  Resp:  17 18 18   Temp:  98.7 F (37.1 C) 98.3 F (36.8 C) 98.5 F (36.9 C)  TempSrc:  Oral Oral Oral  SpO2: 97% 98% 97% 98%  Weight:   97 kg   Height:       Physical Exam General: Well appearing man, NAD. Heart: RRR; no murmur Lungs: CTAB Abdomen: soft, non-tender Extremities: No LE edema Dialysis Access:  [facility]  Additional Objective Labs: Basic Metabolic Panel: Recent Labs  Lab 04/12/20 0356 04/13/20 0753 04/14/20 0530  NA 134* 134* 136  K 4.5 4.5 4.2  CL 93* 93* 98  CO2 26 25 25   GLUCOSE 107* 113* 101*  BUN 43* 63* 35*  CREATININE 10.79* 14.39* 10.21*  CALCIUM 9.3 9.0 8.8*  PHOS 7.0* 8.0* 6.0*   Liver Function Tests: Recent Labs  Lab 04/09/20 0349 04/10/20 0759 04/12/20 0356 04/13/20 0753 04/14/20 0530  AST 36  --   --   --   --   ALT 10  --   --   --   --   ALKPHOS 75  --   --   --   --   BILITOT 0.8  --   --   --   --   PROT 6.2*  --   --   --   --   ALBUMIN 3.1*   < > 3.1* 3.1* 3.0*   < > = values in this interval not displayed.   CBC: Recent Labs  Lab 04/10/20 0759 04/10/20 0759 04/11/20 0315 04/11/20 0315 04/12/20 0356 04/13/20 0752 04/14/20 0530  WBC 6.8   < > 7.6   < > 7.8 9.3 8.5  NEUTROABS 5.0   < > 5.4   < > 5.4 6.6 5.7  HGB 7.4*   < > 7.3*   < > 7.4* 7.8* 7.9*  HCT 22.9*   < > 23.0*   < > 24.0* 25.1* 25.7*  MCV 95.4  --  96.6  --  97.6 99.6 100.0  PLT 100*   < > 104*   < > 127* 126* 149*   < > = values in this interval not displayed.   Studies/Results: VAS [place] UPPER EXT VEIN MAPPING (PRE-OP AVF)  Result Date: 04/13/2020 UPPER EXTREMITY VEIN MAPPING  Indications:  Pre-access. Performing Technologist: June Leap RDMS, RVT  Examination Guidelines: A complete evaluation includes B-mode imaging, spectral Doppler, color Doppler, and power Doppler as needed of all accessible portions of each vessel. Bilateral testing is considered an integral part of a complete examination. Limited examinations for reoccurring indications may be performed as noted. +-----------------+-------------+----------+---------+ Right Cephalic   Diameter (cm)Depth (cm)Findings  +-----------------+-------------+----------+---------+ Shoulder             0.24                         +-----------------+-------------+----------+---------+ Mid upper arm        0.12        0.32   branching +-----------------+-------------+----------+---------+ Dist upper arm       0.09                         +-----------------+-------------+----------+---------+  Antecubital fossa    0.21                         +-----------------+-------------+----------+---------+ Prox forearm         0.20        0.30             +-----------------+-------------+----------+---------+ Mid forearm          0.26        0.35   branching +-----------------+-------------+----------+---------+ Dist forearm         0.22                         +-----------------+-------------+----------+---------+ +-----------------+-------------+----------+---------+ Right Basilic    Diameter (cm)Depth (cm)Findings  +-----------------+-------------+----------+---------+ Prox upper arm       0.26        1.29             +-----------------+-------------+----------+---------+ Mid upper arm        0.33        1.46             +-----------------+-------------+----------+---------+ Dist upper arm       0.36        0.83             +-----------------+-------------+----------+---------+ Antecubital fossa    0.34        0.75   branching +-----------------+-------------+----------+---------+  +-----------------+-------------+----------+---------+ Left Cephalic    Diameter (cm)Depth (cm)Findings  +-----------------+-------------+----------+---------+ Shoulder             0.39        0.65             +-----------------+-------------+----------+---------+ Mid upper arm        0.28                         +-----------------+-------------+----------+---------+ Dist upper arm       0.35        0.37   branching +-----------------+-------------+----------+---------+ Antecubital fossa    0.35                         +-----------------+-------------+----------+---------+ Prox forearm         0.14               branching +-----------------+-------------+----------+---------+ Mid forearm          0.11                         +-----------------+-------------+----------+---------+ +-----------------+-------------+----------+---------+ Left Basilic     Diameter (cm)Depth (cm)Findings  +-----------------+-------------+----------+---------+ Prox upper arm       0.50        1.24             +-----------------+-------------+----------+---------+ Mid upper arm        0.43        0.92   branching +-----------------+-------------+----------+---------+ Dist upper arm       0.33        0.45   branching +-----------------+-------------+----------+---------+ Antecubital fossa    0.32               branching +-----------------+-------------+----------+---------+ *See table(s) above for measurements and observations.  Diagnosing physician: Monica Martinez MD Electronically signed by Monica Martinez MD on 04/13/2020 at 3:15:13 PM.    Final    Medications:  . amLODipine  10 mg Oral Daily  . calcitRIOL  0.5 mcg Oral Q T,Th,Sa-HD  . carvedilol  25 mg Oral BID WC  . Chlorhexidine Gluconate Cloth  6 each Topical Q0600  . Chlorhexidine Gluconate Cloth  6 each Topical Q0600  . darbepoetin (ARANESP) injection - DIALYSIS  200 mcg Intravenous Q Thu-HD  . feeding  supplement (NEPRO CARB STEADY)  237 mL Oral BID BM  . isosorbide-hydrALAZINE  2 tablet Oral TID  . multivitamin  1 tablet Oral QHS  . sevelamer carbonate  2,400 mg Oral TID WC    Dialysis Orders: Establishing orders.  Assessment/Plan: 1. ESRD- Continue with HD on TTS schedule - next 4/20. 2. Vascular access- for RUE access placement on 04/15/20 per Dr. Carlis Abbott 3. HTN/BP- poorly controlled.  Coreg and amlodipine doses ^ yesterday - follow today. 4. 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 5. 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  iPTH 315. 6. Traumatic brain injury- ongoing behavioral and cognitive issues.  Would greatly benefit from neuropsych evaluation 7. Disposition- has outpatient HD at [facility] on TTS schedule once he gets permanent access placement.   Veneta Penton, PA-C 04/14/2020, 10:27 AM  [name]  I have seen and examined this patient and agree with plan and assessment in the above note with renal recommendations/intervention highlighted.  Plan for vascular access tomorrow with [facility] and hopeful discharge after and f/u at [facility] on Tuesday 04/16/20.   Broadus John A Arseniy Toomey,MD 04/14/2020 11:56 AM

## 2020-04-14 NOTE — Plan of Care (Signed)
  Problem: Education: Goal: Knowledge of disease and its progression will improve Outcome: Progressing   
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
## 2020-04-14 NOTE — Progress Notes (Signed)
This Agricultural consultant spoke with the patient and his mother upon arrival back to the floor. Patient and mother educated on safety implications of leaving the hospital to walk up the street. Patient's mother expressed great concern for patient becoming uneasy at not being allowed to have his father visit due to deficits related to his TBI and his need for constant movement (I.e.ambulating). Patient is scheduled to have fistula placed tomorrow and seeing his father decreased his anxiety. Spoke with Lazarus Gowda, AD 5N for approval of third visitor for extenuating circumstances. Family made aware approval has been granted today. Will follow-up with department leadership. Dorthey Sawyer, RN
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
## 2020-04-15 ENCOUNTER — Encounter (HOSPITAL_COMMUNITY): Payer: Self-pay | Admitting: Internal Medicine

## 2020-04-15 ENCOUNTER — Inpatient Hospital Stay (HOSPITAL_COMMUNITY): Payer: Medicaid Other | Admitting: Certified Registered"

## 2020-04-15 ENCOUNTER — Encounter (HOSPITAL_COMMUNITY)
Admission: EM | Disposition: A | Discharge status: Home / Self Care | Payer: Self-pay | Source: Home / Self Care | Attending: Internal Medicine

## 2020-04-15 DIAGNOSIS — I739 Peripheral vascular disease, unspecified: Secondary | ICD-10-CM | POA: Insufficient documentation

## 2020-04-15 DIAGNOSIS — N186 End stage renal disease: Secondary | ICD-10-CM | POA: Diagnosis present

## 2020-04-15 DIAGNOSIS — N185 Chronic kidney disease, stage 5: Secondary | ICD-10-CM

## 2020-04-15 DIAGNOSIS — I129 Hypertensive chronic kidney disease with stage 1 through stage 4 chronic kidney disease, or unspecified chronic kidney disease: Secondary | ICD-10-CM

## 2020-04-15 DIAGNOSIS — N2581 Secondary hyperparathyroidism of renal origin: Secondary | ICD-10-CM | POA: Diagnosis present

## 2020-04-15 DIAGNOSIS — T829XXA Unspecified complication of cardiac and vascular prosthetic device, implant and graft, initial encounter: Secondary | ICD-10-CM | POA: Insufficient documentation

## 2020-04-15 HISTORY — PX: AV FISTULA PLACEMENT: SHX1204

## 2020-04-15 LAB — CBC WITH DIFFERENTIAL/PLATELET
Abs Immature Granulocytes: 0.03 10*3/uL (ref 0.00–0.07)
Basophils Absolute: 0.1 10*3/uL (ref 0.0–0.1)
Basophils Relative: 1 %
Eosinophils Absolute: 0.3 10*3/uL (ref 0.0–0.5)
Eosinophils Relative: 3 %
HCT: 25.8 % — ABNORMAL LOW (ref 39.0–52.0)
Hemoglobin: 8 g/dL — ABNORMAL LOW (ref 13.0–17.0)
Immature Granulocytes: 0 %
Lymphocytes Relative: 11 %
Lymphs Abs: 0.9 10*3/uL (ref 0.7–4.0)
MCH: 31.1 pg (ref 26.0–34.0)
MCHC: 31 g/dL (ref 30.0–36.0)
MCV: 100.4 fL — ABNORMAL HIGH (ref 80.0–100.0)
Monocytes Absolute: 0.9 10*3/uL (ref 0.1–1.0)
Monocytes Relative: 10 %
Neutro Abs: 6.1 10*3/uL (ref 1.7–7.7)
Neutrophils Relative %: 75 %
Platelets: 191 10*3/uL (ref 150–400)
RBC: 2.57 MIL/uL — ABNORMAL LOW (ref 4.22–5.81)
RDW: 14.6 % (ref 11.5–15.5)
WBC: 8.2 10*3/uL (ref 4.0–10.5)
nRBC: 0 % (ref 0.0–0.2)

## 2020-04-15 LAB — RENAL FUNCTION PANEL
Albumin: 3 g/dL — ABNORMAL LOW (ref 3.5–5.0)
Anion gap: 18 — ABNORMAL HIGH (ref 5–15)
BUN: 59 mg/dL — ABNORMAL HIGH (ref 6–20)
CO2: 23 mmol/L (ref 22–32)
Calcium: 9.2 mg/dL (ref 8.9–10.3)
Chloride: 93 mmol/L — ABNORMAL LOW (ref 98–111)
Creatinine, Ser: 13.96 mg/dL — ABNORMAL HIGH (ref 0.61–1.24)
GFR calc Af Amer: 5 mL/min — ABNORMAL LOW (ref >60)
GFR calc non Af Amer: 4 mL/min — ABNORMAL LOW (ref >60)
Glucose, Bld: 85 mg/dL (ref 70–99)
Phosphorus: 6.6 mg/dL — ABNORMAL HIGH (ref 2.5–4.6)
Potassium: 4.9 mmol/L (ref 3.5–5.1)
Sodium: 134 mmol/L — ABNORMAL LOW (ref 135–145)

## 2020-04-15 LAB — PROTIME-INR
INR: 1 (ref 0.8–1.2)
Prothrombin Time: 13.2 seconds (ref 11.4–15.2)

## 2020-04-15 LAB — SURGICAL PCR SCREEN
MRSA, PCR: NEGATIVE
Staphylococcus aureus: NEGATIVE

## 2020-04-15 SURGERY — ARTERIOVENOUS (AV) FISTULA CREATION
Anesthesia: Monitor Anesthesia Care | Site: Arm Upper | Laterality: Right

## 2020-04-15 MED ORDER — PHENYLEPHRINE 40 MCG/ML (10ML) SYRINGE FOR IV PUSH (FOR BLOOD PRESSURE SUPPORT)
PREFILLED_SYRINGE | INTRAVENOUS | Status: AC
  Filled 2020-04-15: qty 10

## 2020-04-15 MED ORDER — PROPOFOL 10 MG/ML IV BOLUS
INTRAVENOUS | Status: AC
  Filled 2020-04-15: qty 20

## 2020-04-15 MED ORDER — CALCITRIOL 0.5 MCG PO CAPS: 0.5000 ug | ORAL_CAPSULE | ORAL | 0 refills | Status: AC

## 2020-04-15 MED ORDER — LIDOCAINE 2% (20 MG/ML) 5 ML SYRINGE
INTRAMUSCULAR | Status: AC
  Filled 2020-04-15: qty 5

## 2020-04-15 MED ORDER — SODIUM CHLORIDE 0.9 % IV SOLN
INTRAVENOUS | Status: AC
  Filled 2020-04-15: qty 1.2

## 2020-04-15 MED ORDER — RENA-VITE PO TABS: 1.0000 | ORAL_TABLET | Freq: Every day | ORAL | 0 refills | Status: AC

## 2020-04-15 MED ORDER — ONDANSETRON HCL 4 MG/2ML IJ SOLN
INTRAMUSCULAR | Status: AC
  Filled 2020-04-15: qty 2

## 2020-04-15 MED ORDER — LIDOCAINE HCL (PF) 0.5 % IJ SOLN
INTRAMUSCULAR | Status: AC
  Filled 2020-04-15: qty 50

## 2020-04-15 MED ORDER — FENTANYL CITRATE (PF) 250 MCG/5ML IJ SOLN
INTRAMUSCULAR | Status: AC
  Filled 2020-04-15: qty 5

## 2020-04-15 MED ORDER — SODIUM CHLORIDE 0.9 % IV SOLN: INTRAVENOUS | Status: DC

## 2020-04-15 MED ORDER — CARVEDILOL 25 MG PO TABS: 25.0000 mg | ORAL_TABLET | Freq: Two times a day (BID) | ORAL | 0 refills | Status: DC

## 2020-04-15 MED ORDER — MIDAZOLAM HCL 5 MG/5ML IJ SOLN
INTRAMUSCULAR | Status: DC | PRN
  Administered 2020-04-15: 2 mg via INTRAVENOUS

## 2020-04-15 MED ORDER — LIDOCAINE HCL (PF) 0.5 % IJ SOLN
INTRAMUSCULAR | Status: DC | PRN
  Administered 2020-04-15: 50 mL

## 2020-04-15 MED ORDER — PROPOFOL 10 MG/ML IV BOLUS
INTRAVENOUS | Status: DC | PRN
  Administered 2020-04-15: 10 mg via INTRAVENOUS
  Administered 2020-04-15: 20 mg via INTRAVENOUS

## 2020-04-15 MED ORDER — SEVELAMER CARBONATE 800 MG PO TABS: 2400.0000 mg | ORAL_TABLET | Freq: Three times a day (TID) | ORAL | 0 refills | Status: AC

## 2020-04-15 MED ORDER — HYDRALAZINE HCL 100 MG PO TABS: 100.0000 mg | ORAL_TABLET | Freq: Two times a day (BID) | ORAL | 0 refills | Status: DC

## 2020-04-15 MED ORDER — EPHEDRINE 5 MG/ML INJ
INTRAVENOUS | Status: AC
  Filled 2020-04-15: qty 10

## 2020-04-15 MED ORDER — PROPOFOL 500 MG/50ML IV EMUL
INTRAVENOUS | Status: DC | PRN
  Administered 2020-04-15: 75 ug/kg/min via INTRAVENOUS

## 2020-04-15 MED ORDER — AMLODIPINE BESYLATE 10 MG PO TABS: 10.0000 mg | ORAL_TABLET | Freq: Every day | ORAL | 0 refills | Status: DC

## 2020-04-15 MED ORDER — MIDAZOLAM HCL 2 MG/2ML IJ SOLN
INTRAMUSCULAR | Status: AC
  Filled 2020-04-15: qty 2

## 2020-04-15 MED ORDER — ONDANSETRON HCL 4 MG/2ML IJ SOLN
INTRAMUSCULAR | Status: DC | PRN
  Administered 2020-04-15: 4 mg via INTRAVENOUS

## 2020-04-15 MED ORDER — FENTANYL CITRATE (PF) 100 MCG/2ML IJ SOLN: 25.0000 ug | INTRAMUSCULAR | Status: DC | PRN

## 2020-04-15 MED ORDER — SODIUM CHLORIDE 0.9 % IV SOLN
INTRAVENOUS | Status: DC | PRN
  Administered 2020-04-15: 500 mL

## 2020-04-15 MED ORDER — 0.9 % SODIUM CHLORIDE (POUR BTL) OPTIME
TOPICAL | Status: DC | PRN
  Administered 2020-04-15: 10:00:00 1000 mL

## 2020-04-15 MED FILL — hydrALAZINE HCL 100 MG TABS: 100 | 30 days supply | Qty: 60 | Fill #0

## 2020-04-15 MED FILL — AMLODIPINE BESYLATE 10 MG T: 10 | 30 days supply | Qty: 30 | Fill #0

## 2020-04-15 MED FILL — RENA-VITE TABLET: 30 days supply | Qty: 30 | Fill #0

## 2020-04-15 MED FILL — CALCITRIOL CAP 0.5 MCG: 0.5 | 30 days supply | Qty: 12 | Fill #0

## 2020-04-15 MED FILL — CARVEDILOL 25 MG TABLET: 25 | 30 days supply | Qty: 60 | Fill #0

## 2020-04-15 MED FILL — SEVELAMER CARBONATE 800MG: 800 | 30 days supply | Qty: 270 | Fill #0

## 2020-04-15 SURGICAL SUPPLY — 31 items
ADH SKN CLS APL DERMABOND .7 (GAUZE/BANDAGES/DRESSINGS) ×1
ARMBAND PINK RESTRICT EXTREMIT (MISCELLANEOUS) ×4 IMPLANT
CANISTER SUCT 3000ML PPV (MISCELLANEOUS) ×3 IMPLANT
CANNULA VESSEL 3MM 2 BLNT TIP (CANNULA) ×3 IMPLANT
CLIP LIGATING EXTRA MED SLVR (CLIP) ×3 IMPLANT
CLIP LIGATING EXTRA SM BLUE (MISCELLANEOUS) ×3 IMPLANT
COVER PROBE W GEL 5X96 (DRAPES) ×3 IMPLANT
COVER WAND RF STERILE (DRAPES) ×3 IMPLANT
DECANTER SPIKE VIAL GLASS SM (MISCELLANEOUS) ×3 IMPLANT
DERMABOND ADVANCED (GAUZE/BANDAGES/DRESSINGS) ×2
DERMABOND ADVANCED .7 DNX12 (GAUZE/BANDAGES/DRESSINGS) ×1 IMPLANT
ELECT REM PT RETURN 9FT ADLT (ELECTROSURGICAL) ×3
ELECTRODE REM PT RTRN 9FT ADLT (ELECTROSURGICAL) ×1 IMPLANT
GLOVE BIO SURGEON STRL SZ 6 (GLOVE) ×2 IMPLANT
GLOVE BIO SURGEON STRL SZ7.5 (GLOVE) ×2 IMPLANT
GLOVE SS BIOGEL STRL SZ 7.5 (GLOVE) ×1 IMPLANT
GLOVE SUPERSENSE BIOGEL SZ 7.5 (GLOVE) ×2
GOWN STRL REUS W/ TWL LRG LVL3 (GOWN DISPOSABLE) ×3 IMPLANT
GOWN STRL REUS W/TWL LRG LVL3 (GOWN DISPOSABLE) ×9
KIT BASIN OR (CUSTOM PROCEDURE TRAY) ×3 IMPLANT
KIT TURNOVER KIT B (KITS) ×3 IMPLANT
MARKER SKIN DUAL TIP RULER LAB (MISCELLANEOUS) ×2 IMPLANT
NS IRRIG 1000ML POUR BTL (IV SOLUTION) ×3 IMPLANT
PACK CV ACCESS (CUSTOM PROCEDURE TRAY) ×3 IMPLANT
PAD ARMBOARD 7.5X6 YLW CONV (MISCELLANEOUS) ×6 IMPLANT
SUT PROLENE 6 0 CC (SUTURE) ×3 IMPLANT
SUT VIC AB 3-0 SH 27 (SUTURE) ×3
SUT VIC AB 3-0 SH 27X BRD (SUTURE) ×1 IMPLANT
TOWEL GREEN STERILE (TOWEL DISPOSABLE) ×3 IMPLANT
UNDERPAD 30X30 (UNDERPADS AND DIAPERS) ×3 IMPLANT
WATER STERILE IRR 1000ML POUR (IV SOLUTION) ×3 IMPLANT
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
## 2020-04-15 NOTE — Transfer of Care (Signed)
Immediate Anesthesia Transfer of Care Note  Patient: Douglas [name]  Procedure(s) Performed: RIGHT ARTERIOVENOUS (AV) FISTULA CREATION (Right Arm Upper)  Patient Location: PACU  Anesthesia Type:MAC  Level of Consciousness: drowsy and patient cooperative  Airway & Oxygen Therapy: Patient Spontanous Breathing and Patient connected to nasal cannula oxygen  Post-op Assessment: Report given to RN and Post -op Vital signs reviewed and stable  Post vital signs: Reviewed and stable  Last Vitals:  Vitals Value Taken Time  BP 135/79 04/15/20 1048  Temp 36.9 C 04/15/20 1048  Pulse 84 04/15/20 1048  Resp 18 04/15/20 1048  SpO2 100 % 04/15/20 1048  Vitals shown include unvalidated device data.  Last Pain:  Vitals:   04/15/20 0618  TempSrc: Oral  PainSc:       Patients Stated Pain Goal: 0 (88/28/00 3491)  Complications: No apparent anesthesia complications

## 2020-04-15 NOTE — Anesthesia Postprocedure Evaluation (Signed)
Anesthesia Post Note  Patient: Douglas [name]  Procedure(s) Performed: RIGHT ARTERIOVENOUS (AV) FISTULA CREATION (Right Arm Upper)     Patient location during evaluation: PACU Anesthesia Type: MAC Level of consciousness: awake and alert Pain management: pain level controlled Vital Signs Assessment: post-procedure vital signs reviewed and stable Respiratory status: spontaneous breathing, nonlabored ventilation, respiratory function stable and patient connected to nasal cannula oxygen Cardiovascular status: stable and blood pressure returned to baseline Postop Assessment: no apparent nausea or vomiting Anesthetic complications: no    Last Vitals:  Vitals:   04/15/20 1126 04/15/20 1137  BP:  (!) 149/77  Pulse: 85 84  Resp: 18 17  Temp: 36.9 C 36.9 C  SpO2: 96% 98%    Last Pain:  Vitals:   04/15/20 1137  TempSrc: Oral  PainSc:                  Jayelle Page L Ladan Vanderzanden

## 2020-04-15 NOTE — Progress Notes (Signed)
DISCHARGE NOTE HOME Douglas [name] to be discharged Home per MD order. Discussed prescriptions and follow up appointments with the patient. Prescriptions given to patient; medication list explained in detail. Patient verbalized understanding.  Skin clean, dry and intact without evidence of skin break down, no evidence of skin tears noted. IV catheter discontinued intact. Site without signs and symptoms of complications. Dressing and pressure applied. Pt denies pain at the site currently. No complaints noted.  Patient free of lines, drains, and wounds.   An After Visit Summary (AVS) was printed and given to the patient. Patient escorted via wheelchair, and discharged home via private auto.  Aneta Mins BSN, RN3

## 2020-04-15 NOTE — Plan of Care (Signed)
  Problem: Health Behavior/Discharge Planning: Goal: Ability to manage health-related needs will improve Outcome: Progressing   

## 2020-04-15 NOTE — TOC Transition Note (Signed)
Transition of Care [facility]) - CM/SW Discharge Note   Patient Details  Name: Douglas [name] MRN: [id number] Date of Birth: 10-Jan-1986  Transition of Care [facility]) CM/SW Contact:  Bartholomew Crews, RN Phone Number: [phone] 04/15/2020, 1:53 PM   Clinical Narrative:     Spoke with patient and mom at the bedside about transition planning. Hospital follow up appointment scheduled at [facility] for 5/3 2:30 pm - appointment information is on AVS. Discussed medications - agreeable to [place] - will provide Match. No further TOC needs identified at this time.    Final next level of care: Home/Self Care Barriers to Discharge: No Barriers Identified   Patient Goals and CMS Choice Patient states their goals for this hospitalization and ongoing recovery are:: home CMS Medicare.gov Compare Post Acute Care list provided to:: Patient Choice offered to / list presented to : NA  Discharge Placement                       Discharge Plan and Services In-house Referral: NA Discharge Planning Services: CM Consult Post Acute Care Choice: NA          DME Arranged: N/A DME Agency: NA       HH Arranged: NA HH Agency: NA        Social Determinants of Health (SDOH) Interventions     Readmission Risk Interventions No flowsheet data found.

## 2020-04-15 NOTE — Op Note (Signed)
    OPERATIVE REPORT  DATE OF SURGERY: 04/15/2020  PATIENT: Douglas [name], 34 y.o. male MRN: [id number]  DOB: Oct 17, 1986  PRE-OPERATIVE DIAGNOSIS: End-stage renal disease  POST-OPERATIVE DIAGNOSIS:  Same  PROCEDURE: Right upper arm for stage brachiobasilic fistula  SURGEON:  Curt Jews, M.D.  PHYSICIAN ASSISTANT: Laurence Slate, PA-C  ANESTHESIA: Local with sedation  EBL: per anesthesia record  Total I/O In: 180 [I.V.:80; IV Piggyback:100] Out: 15 [Blood:15]  BLOOD ADMINISTERED: none  DRAINS: none  SPECIMEN: none  COUNTS CORRECT:  YES  PATIENT DISPOSITION:  PACU - hemodynamically stable  PROCEDURE DETAILS: Patient was taken the operating placed to position the area of the left arm was prepped draped in a sterile fashion.  SonoSite ultrasound was used to visualize the basilic vein which was of good caliber above the antecubital space.  There were several branches on the distal upper arm.  Using local anesthesia incision was made over the basilic vein carried down to isolate these branches.  The vein and the branches were ligated distally and divided.  The largest of these 3 were chosen.  This was cannulated with heparinized saline and gently dilated and was of good caliber.  The brachial artery was exposed through the same incision.  The artery was of large caliber with no evidence of atherosclerotic change.  The artery was occluded proximally distally and was opened with 11 blade similarly with Potts scissors.  The vein was brought into approximation to the artery and was cut to the appropriate length and was spatulated and sewn end-to-side to the artery with a running 6-0 Prolene suture.  Clamps were removed and excellent thrill was noted.  The patient did maintain a left radial pulse.  Wound irrigated with saline.  Was closed with 3-0 Vicryl in the subcutaneous and subcuticular tissue.  Sterile dressing was applied and the patient was transferred to the recovery room in  stable condition   [name], M.D., [employer] 04/15/2020 12:20 PM

## 2020-04-15 NOTE — Discharge Summary (Signed)
Discharge Summary  Philbert Ocallaghan DEY:[id number] DOB: July 01, 1986  PCP: Patient, No Pcp Per  Admit date: 04/08/2020 Discharge date: 04/15/2020  Time spent: 40 mins  Recommendations for Outpatient Follow-up:  1. Follow-up with [facility] for 5/3 at 2:30 PM 2. Follow-up with nephrology, scheduled hemodialysis   Discharge Diagnoses:  Active Hospital Problems   Diagnosis Date Noted  . Hyperkalemia 04/08/2020  . Thrombocytopenia ([place]) 04/08/2020  . Anemia due to chronic kidney disease 04/08/2020  . Tremor due to metabolic disorder 31/49/7026  . Acute noncardiogenic pulmonary edema ([place]) 04/08/2020  . Hypertensive renal disease, malignant, with renal failure 03/31/2020  . Hypertensive urgency     Resolved Hospital Problems  No resolved problems to display.    Discharge Condition: Stable  Diet recommendation: Renal diet  Vitals:   04/15/20 1126 04/15/20 1137  BP:  (!) 149/77  Pulse: 85 84  Resp: 18 17  Temp: 98.5 F (36.9 C) 98.4 F (36.9 C)  SpO2: 96% 98%    History of present illness:  Jaryn Rosko is an 34 y.o. 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  Since discharge pt is reporting generalized weakness, cough and congestion.    Today, met mother at bedside, patient resting, just came back from surgery.  Discussed discharge plan extensively, including compliance to medication and HD.      Hospital Course:  Active Problems:   Hypertensive renal disease, malignant, with renal failure   Hypertensive urgency   Hyperkalemia   Thrombocytopenia (HCC)  Anemia due to chronic kidney disease   Tremor due to metabolic disorder   Acute noncardiogenic pulmonary edema (HCC)   Hypertensive renal disease, malignant, with renal failure/ Hypertensive urgency/end-stage renal disease BP has been difficult to get under control, currently improving Discharge patient on amlodipine 10 mg daily, Coreg 25 mg twice daily, hydralazine 100 mg twice daily Patient to be compliance with his hemodialysis sessions on Tuesday/Thursday/Saturday schedule Vascular surgery initiated fistula placement on 04/15/2020, follow-up as an outpatient Follow-up with PCP and nephrology  Chronic thrombocytopenia Improved Discussed previously with heme-onc Dr Alvy Bimler, likely due to malignant hypertension Follow-up with PCP  Anemia of chronic kidney disease Anemia panel showed iron 76, TIBC 248, sats 31, ferritin 647 Follow-up with nephrology          Malnutrition Type:  Nutrition Problem: Inadequate oral intake Etiology: decreased appetite, acute illness   Malnutrition Characteristics:  Signs/Symptoms: per patient/family report   Nutrition Interventions:  Interventions: Snacks, MVI, Nepro shake   Estimated body mass index is 26.27 kg/m as calculated from the following:   Height as of this encounter: '6\' 5"'$  (1.956 m).   Weight as of this encounter: 100.5 kg.    Procedures:  Fistula placement on 04/15/2020  Consultations:  Nephrology  Vascular surgery  Discharge Exam: BP (!) 149/77 (BP Location: Left Arm)   Pulse 84   Temp 98.4 F (36.9 C) (Oral)   Resp 17   Ht '6\' 5"'$  (1.956 m)   Wt 100.5 kg   SpO2 98%   BMI 26.27 kg/m   General: NAD Cardiovascular: S1, S2 present Respiratory: CTAB   Discharge Instructions You were cared for by a hospitalist during  your hospital stay. If you have any questions about your discharge medications or the care you received while you were in the hospital after you are discharged, you can call the unit and asked  to speak with the hospitalist on call if the hospitalist that took care of you is not available. Once you are discharged, your primary care physician will handle any further medical issues. Please note that NO REFILLS for any discharge medications will be authorized once you are discharged, as it is imperative that you return to your primary care physician (or establish a relationship with a primary care physician if you do not have one) for your aftercare needs so that they can reassess your need for medications and monitor your lab values.  Discharge Instructions    Diet - low sodium heart healthy   Complete by: As directed    Increase activity slowly   Complete by: As directed      Allergies as of 04/15/2020      Reactions   Pork-derived Products Nausea And Vomiting      Medication List    TAKE these medications   amLODipine 10 MG tablet Commonly known as: NORVASC Take 1 tablet (10 mg total) by mouth daily. Start taking on: April 16, 2020   calcitRIOL 0.5 MCG capsule Commonly known as: ROCALTROL Take 1 capsule (0.5 mcg total) by mouth Every Tuesday,Thursday,and Saturday with dialysis. Start taking on: April 16, 2020   carvedilol 25 MG tablet Commonly known as: COREG Take 1 tablet (25 mg total) by mouth 2 (two) times daily with a meal.   hydrALAZINE 100 MG tablet Commonly known as: APRESOLINE Take 1 tablet (100 mg total) by mouth 2 (two) times daily.   multivitamin Tabs tablet Take 1 tablet by mouth at bedtime.   sevelamer carbonate 800 MG tablet Commonly known as: RENVELA Take 3 tablets (2,400 mg total) by mouth 3 (three) times daily with meals.      Allergies  Allergen Reactions  . Pork-Derived Products Nausea And Vomiting   Follow-up Information    Early, Arvilla Meres, MD In 6 weeks.   Specialties: Vascular Surgery, Cardiology Why: Office will call you to arrange your appt (sent) Contact information: [place] [id number] [phone]             The results of significant diagnostics from this hospitalization (including imaging, microbiology, ancillary and laboratory) are listed below for reference.    Significant Diagnostic Studies: DG Chest 2 View  Result Date: 04/08/2020 CLINICAL DATA:  Shortness of breath. EXAM: CHEST - 2 VIEW COMPARISON:  Chest x-ray dated March 31, 2020. FINDINGS: New tunneled right internal jugular dialysis catheter with tip in the distal SVC. Stable cardiomegaly. Slightly worsened pulmonary vascular congestion and mild peripheral interstitial thickening. Improving opacity in the peripheral right lung with mild residual opacity in the right upper lobe. No pleural effusion or pneumothorax. No acute osseous abnormality. IMPRESSION: 1. Mildly worsened pulmonary vascular congestion and interstitial edema. 2. Improving airspace disease in the peripheral right lung. Electronically Signed   By: Titus Dubin M.D.   On: 04/08/2020 17:33   CT Head Wo Contrast  Result Date: 03/31/2020 CLINICAL DATA:  Malignant hypertension. EXAM: CT HEAD WITHOUT CONTRAST TECHNIQUE: Contiguous axial images were obtained from the base of the skull through the vertex without intravenous contrast. COMPARISON:  Head CT dated 08/09/2008. FINDINGS: Brain: Ventricles are stable in size and configuration. Patchy hypodensities within the bilateral periventricular and subcortical white matter regions, of certain  chronicity but favored to represent sequela of chronic small vessel ischemia. No mass, hemorrhage, edema or other evidence of acute parenchymal abnormality. No extra-axial hemorrhage. Vascular: No hyperdense vessel or unexpected calcification. Skull: Normal. Negative for fracture or focal lesion. Sinuses/Orbits: No acute finding. Other: Opacification of the LEFT middle ear cavity and LEFT mastoid air cells. IMPRESSION: 1. No intracranial mass or hemorrhage. 2. Patchy hypodensities within the bilateral periventricular and subcortical white matter  regions, of uncertain chronicity but favored to represent sequela of chronic small vessel ischemia. Differential would also include demyelinating white matter disease such as multiple sclerosis, ADEM and PML. Would consider brain MRI for further characterization. 3. Opacification of the LEFT middle air cavity. Recommend clinical correlation for possible otitis media. Electronically Signed   By: Franki Cabot M.D.   On: 03/31/2020 15:55   NM Pulmonary Perfusion  Result Date: 03/31/2020 CLINICAL DATA:  Hemoptysis. EXAM: NUCLEAR MEDICINE PERFUSION LUNG SCAN TECHNIQUE: Perfusion images were obtained in multiple projections after intravenous injection of radiopharmaceutical. Patient could not tolerate ventilation imaging. RADIOPHARMACEUTICALS:  1.9 mCi Tc-63mMAA IV COMPARISON:  Chest radiograph earlier this day. FINDINGS: Vague decreased perfusion in the periphery of the posterior right upper lobe and to a lesser extent right middle lobe, corresponds to opacity on radiograph. Homogeneous perfusion to the left lung. IMPRESSION: Vague decreased perfusion in the posterior right upper lobe and to a lesser extent right middle lobe corresponding to abnormality on chest radiograph. Patient could not tolerate ventilation imaging. Findings are consistent with intermediate probability for pulmonary embolus. Electronically Signed   By: MKeith RakeM.D.   On: 03/31/2020 17:54   [place]  Result Date: 04/01/2020 CLINICAL DATA:  Acute renal insufficiency.  History of hypertension. EXAM: RENAL / URINARY TRACT ULTRASOUND COMPLETE COMPARISON:  None. FINDINGS: Right Kidney: Renal measurements: 7.6 x 4.7 x 5.4 cm = volume: 100.8 ML. Increased renal echogenicity but normal renal cortical thickness. No focal lesions or hydronephrosis. No definite renal calculi. Left Kidney: Renal measurements: 11.3 x 6.4 x 5.5 cm = volume: 207.0 mL. Increased echogenicity but normal renal cortical thickness. Slightly complex appearing 1.4 x 1.4 x  1.4 cm cyst in the upper pole. No hydronephrosis. Bladder: Very poorly distended. Other: None. IMPRESSION: 1. Increased renal echogenicity suggesting medical renal disease. 2. No hydronephrosis. 3. Slightly complex 1.4 cm left renal cyst. Recommend follow-up ultrasound examination in 6 months. 4. Decompressed bladder. Electronically Signed   By: PMarijo SanesM.D.   On: 04/01/2020 06:35   IR Fluoro Guide CV Line Right  Result Date: 04/03/2020 INDICATION: 34year old male with a history of renal failure referred for tunneled hemodialysis catheter EXAM: TUNNELED CENTRAL VENOUS HEMODIALYSIS CATHETER PLACEMENT WITH ULTRASOUND AND FLUOROSCOPIC GUIDANCE MEDICATIONS: 2 g Ancef. The antibiotic was given in an appropriate time interval prior to skin puncture. ANESTHESIA/SEDATION: A total of Versed 0 mg and Fentanyl 25 mcg was administered intravenously. No moderate sedation FLUOROSCOPY TIME:  Fluoroscopy Time: 1 minutes 30 seconds (7 mGy). COMPLICATIONS: None PROCEDURE: Informed written consent was obtained from the patient after a discussion of the risks, benefits, and alternatives to treatment. Questions regarding the procedure were encouraged and answered. The right neck and chest were prepped with chlorhexidine in a sterile fashion, and a sterile drape was applied covering the operative field. Maximum barrier sterile technique with sterile gowns and gloves were used for the procedure. A timeout was performed prior to the initiation of the procedure. After creating a small venotomy incision, a micropuncture kit was utilized to access  the right internal jugular vein under direct, real-time ultrasound guidance after the overlying soft tissues were anesthetized with 1% lidocaine with epinephrine. Ultrasound image documentation was performed. The microwire was marked to measure appropriate internal catheter length. External tunneled length was estimated. A total tip to cuff length of 23 cm was selected. Skin and  subcutaneous tissues of chest wall below the clavicle were generously infiltrated with 1% lidocaine for local anesthesia. A small stab incision was made with 11 blade scalpel. The selected hemodialysis catheter was tunneled in a retrograde fashion from the anterior chest wall to the venotomy incision. A guidewire was advanced to the level of the IVC and the micropuncture sheath was exchanged for a peel-away sheath. The catheter was then placed through the peel-away sheath with tips ultimately positioned within the superior aspect of the right atrium. Final catheter positioning was confirmed and documented with a spot radiographic image. The catheter aspirates and flushes normally. The catheter was flushed with appropriate volume heparin dwells. The catheter exit site was secured with a 0-Prolene retention suture. The venotomy incision was closed Derma bond and sterile dressing. Dressings were applied at the chest wall. Patient tolerated the procedure well and remained hemodynamically stable throughout. No complications were encountered and no significant blood loss encountered. IMPRESSION: Status post right IJ tunneled hemodialysis catheter placement. Catheter ready for use. Signed, Dulcy Fanny. Dellia Nims, RPVI [facility] Electronically Signed   By: Corrie Mckusick D.O.   On: 04/03/2020 16:06   IR US Guide Vasc Access Right  Result Date: 04/03/2020 INDICATION: 34 year old male with a history of renal failure referred for tunneled hemodialysis catheter EXAM: TUNNELED CENTRAL VENOUS HEMODIALYSIS CATHETER PLACEMENT WITH ULTRASOUND AND FLUOROSCOPIC GUIDANCE MEDICATIONS: 2 g Ancef. The antibiotic was given in an appropriate time interval prior to skin puncture. ANESTHESIA/SEDATION: A total of Versed 0 mg and Fentanyl 25 mcg was administered intravenously. No moderate sedation FLUOROSCOPY TIME:  Fluoroscopy Time: 1 minutes 30 seconds (7 mGy). COMPLICATIONS: None  PROCEDURE: Informed written consent was obtained from the patient after a discussion of the risks, benefits, and alternatives to treatment. Questions regarding the procedure were encouraged and answered. The right neck and chest were prepped with chlorhexidine in a sterile fashion, and a sterile drape was applied covering the operative field. Maximum barrier sterile technique with sterile gowns and gloves were used for the procedure. A timeout was performed prior to the initiation of the procedure. After creating a small venotomy incision, a micropuncture kit was utilized to access the right internal jugular vein under direct, real-time ultrasound guidance after the overlying soft tissues were anesthetized with 1% lidocaine with epinephrine. Ultrasound image documentation was performed. The microwire was marked to measure appropriate internal catheter length. External tunneled length was estimated. A total tip to cuff length of 23 cm was selected. Skin and subcutaneous tissues of chest wall below the clavicle were generously infiltrated with 1% lidocaine for local anesthesia. A small stab incision was made with 11 blade scalpel. The selected hemodialysis catheter was tunneled in a retrograde fashion from the anterior chest wall to the venotomy incision. A guidewire was advanced to the level of the IVC and the micropuncture sheath was exchanged for a peel-away sheath. The catheter was then placed through the peel-away sheath with tips ultimately positioned within the superior aspect of the right atrium. Final catheter positioning was confirmed and documented with a spot radiographic image. The catheter aspirates and flushes normally. The catheter was flushed with appropriate volume  heparin dwells. The catheter exit site was secured with a 0-Prolene retention suture. The venotomy incision was closed Derma bond and sterile dressing. Dressings were applied at the chest wall. Patient tolerated the procedure well and  remained hemodynamically stable throughout. No complications were encountered and no significant blood loss encountered. IMPRESSION: Status post right IJ tunneled hemodialysis catheter placement. Catheter ready for use. Signed, Dulcy Fanny. Dellia Nims, RPVI [facility] Electronically Signed   By: Corrie Mckusick D.O.   On: 04/03/2020 16:06   DG Chest Port 1 View  Result Date: 03/31/2020 CLINICAL DATA:  Chest pain, shortness of breath cough EXAM: PORTABLE CHEST 1 VIEW COMPARISON:  07/04/2014 FINDINGS: Cardiomegaly. Heterogeneous airspace opacity of peripheral right lung. The visualized skeletal structures are unremarkable. IMPRESSION: Cardiomegaly with heterogeneous airspace opacity of the peripheral right lung, concerning for infection. Electronically Signed   By: Eddie Candle M.D.   On: 03/31/2020 15:07   ECHOCARDIOGRAM COMPLETE  Result Date: 04/01/2020    ECHOCARDIOGRAM REPORT   Patient Name:   NEHAN FLAUM Date of Exam: 04/01/2020 Medical Rec #:  [id number]      Height:       77.0 in Accession #:    [id number]     Weight:       218.0 lb Date of Birth:  1986/06/25      BSA:          2.320 m Patient Age:    75 years       BP:           158/88 mmHg Patient Gender: M              HR:           107 bpm. Exam Location:  Inpatient Procedure: 2D Echo Indications:    786.09 dyspnea  History:        Patient has no prior history of Echocardiogram examinations.                 COPD; Risk Factors:Hypertension and Current Smoker.  Sonographer:    Jannett Celestine RDCS (AE) Referring Phys: [id number] [place]  1. There is severe concentric LVH up to 22 mm. It is not asymmetric to suggest hypertrophic cardiomyopathy. The differential includes hypertensive heart disease vs infiltrative cardiomyopathy, such as amyloidosis. Clinical correlation is recommended in this patient with uncontrolled hypertension. Left ventricular ejection fraction, by estimation, is 60  to 65%. The left ventricle has normal function. The left ventricle has no regional wall motion abnormalities. There is severe concentric left ventricular hypertrophy. Indeterminate diastolic filling due to E-A fusion.  2. Right ventricular systolic function is normal. The right ventricular size is normal. Tricuspid regurgitation signal is inadequate for assessing PA pressure.  3. Left atrial size was mild to moderately dilated.  4. The mitral valve is grossly normal. Trivial mitral valve regurgitation. No evidence of mitral stenosis.  5. The aortic valve is tricuspid. Aortic valve regurgitation is not visualized. No aortic stenosis is present.  6. The inferior vena cava is normal in size with greater than 50% respiratory variability, suggesting right atrial pressure of 3 mmHg. FINDINGS  Left Ventricle: There is severe concentric LVH up to 22 mm. It is not asymmetric to suggest hypertrophic cardiomyopathy. The differential includes hypertensive heart disease vs infiltrative cardiomyopathy, such as amyloidosis. Clinical correlation is recommended in this patient with uncontrolled hypertension. Left ventricular ejection fraction, by estimation, is 60 to 65%. The left ventricle has normal  function. The left ventricle has no regional wall motion abnormalities. The left ventricular internal cavity size was small. There is severe concentric left ventricular hypertrophy. Indeterminate diastolic filling due to E-A fusion. Right Ventricle: The right ventricular size is normal. No increase in right ventricular wall thickness. Right ventricular systolic function is normal. Tricuspid regurgitation signal is inadequate for assessing PA pressure. Left Atrium: Left atrial size was mild to moderately dilated. Right Atrium: Right atrial size was normal in size. Pericardium: Trivial pericardial effusion is present. Presence of pericardial fat pad. Mitral Valve: The mitral valve is grossly normal. Trivial mitral valve regurgitation. No  evidence of mitral valve stenosis. Tricuspid Valve: The tricuspid valve is grossly normal. Tricuspid valve regurgitation is not demonstrated. No evidence of tricuspid stenosis. Aortic Valve: The aortic valve is tricuspid. Aortic valve regurgitation is not visualized. No aortic stenosis is present. Pulmonic Valve: The pulmonic valve was grossly normal. Pulmonic valve regurgitation is not visualized. No evidence of pulmonic stenosis. Aorta: The aortic root is normal in size and structure. Venous: The inferior vena cava is normal in size with greater than 50% respiratory variability, suggesting right atrial pressure of 3 mmHg. IAS/Shunts: The atrial septum is grossly normal. EKG: Rhythm strip during this exam demostrated sinus tachycardia.  LEFT VENTRICLE PLAX 2D LVIDd:         4.90 cm LVIDs:         3.50 cm LV PW:         1.70 cm LV IVS:        2.20 cm LVOT diam:     2.20 cm LV SV:         81 LV SV Index:   35 LVOT Area:     3.80 cm  RIGHT VENTRICLE RV S prime:     14.40 cm/s TAPSE (M-mode): 1.3 cm LEFT ATRIUM            Index       RIGHT ATRIUM           Index LA diam:      4.80 cm  2.07 cm/m  RA Area:     19.70 cm LA Vol (A4C): 103.0 ml 44.39 ml/m RA Volume:   50.20 ml  21.63 ml/m  AORTIC VALVE LVOT Vmax:   153.00 cm/s LVOT Vmean:  113.000 cm/s LVOT VTI:    0.214 m  AORTA Ao Root diam: 3.50 cm MITRAL VALVE MV Area (PHT): 5.31 cm     SHUNTS MV Decel Time: 143 msec     Systemic VTI:  0.21 m MV E velocity: 139.00 cm/s  Systemic Diam: 2.20 cm Eleonore Chiquito MD Electronically signed by Eleonore Chiquito MD Signature Date/Time: 04/01/2020/2:03:10 PM    Final    VAS [place] LOWER EXTREMITY VENOUS (DVT)  Result Date: 04/01/2020  Lower Venous DVTStudy Indications: SOB. Other Indications: Intermediate probability VQ scan. Comparison Study: no prior Performing Technologist: June Leap RDMS, RVT  Examination Guidelines: A complete evaluation includes B-mode imaging, spectral Doppler, color Doppler, and power Doppler as needed of  all accessible portions of each vessel. Bilateral testing is considered an integral part of a complete examination. Limited examinations for reoccurring indications may be performed as noted. The reflux portion of the exam is performed with the patient in reverse Trendelenburg.  +---------+---------------+---------+-----------+----------+--------------+ RIGHT    CompressibilityPhasicitySpontaneityPropertiesThrombus Aging +---------+---------------+---------+-----------+----------+--------------+ CFV      Full           Yes      Yes                                 +---------+---------------+---------+-----------+----------+--------------+  SFJ      Full                                                        +---------+---------------+---------+-----------+----------+--------------+ FV Prox  Full                                                        +---------+---------------+---------+-----------+----------+--------------+ FV Mid   Full                                                        +---------+---------------+---------+-----------+----------+--------------+ FV DistalFull                                                        +---------+---------------+---------+-----------+----------+--------------+ PFV      Full                                                        +---------+---------------+---------+-----------+----------+--------------+ POP      Full           Yes      Yes                                 +---------+---------------+---------+-----------+----------+--------------+ PTV      Full                                                        +---------+---------------+---------+-----------+----------+--------------+ PERO     Full                                                        +---------+---------------+---------+-----------+----------+--------------+    +---------+---------------+---------+-----------+----------+--------------+ LEFT     CompressibilityPhasicitySpontaneityPropertiesThrombus Aging +---------+---------------+---------+-----------+----------+--------------+ CFV      Full           Yes      Yes                                 +---------+---------------+---------+-----------+----------+--------------+ SFJ      Full                                                        +---------+---------------+---------+-----------+----------+--------------+  FV Prox  Full                                                        +---------+---------------+---------+-----------+----------+--------------+ FV Mid   Full                                                        +---------+---------------+---------+-----------+----------+--------------+ FV DistalFull                                                        +---------+---------------+---------+-----------+----------+--------------+ PFV      Full                                                        +---------+---------------+---------+-----------+----------+--------------+ POP      Full           Yes      Yes                                 +---------+---------------+---------+-----------+----------+--------------+ PTV      Full                                                        +---------+---------------+---------+-----------+----------+--------------+ PERO     Full                                                        +---------+---------------+---------+-----------+----------+--------------+     Summary: RIGHT: - There is no evidence of deep vein thrombosis in the lower extremity.  - No cystic structure found in the popliteal fossa.  LEFT: - There is no evidence of deep vein thrombosis in the lower extremity.  - No cystic structure found in the popliteal fossa.  *See table(s) above for measurements and observations. Electronically signed  by Monica Martinez MD on 04/01/2020 at 5:36:01 PM.    Final    VAS [place] UPPER EXT VEIN MAPPING (PRE-OP AVF)  Result Date: 04/13/2020 UPPER EXTREMITY VEIN MAPPING  Indications: Pre-access. Performing Technologist: June Leap RDMS, RVT  Examination Guidelines: A complete evaluation includes B-mode imaging, spectral Doppler, color Doppler, and power Doppler as needed of all accessible portions of each vessel. Bilateral testing is considered an integral part of a complete examination. Limited examinations for reoccurring indications may be performed as noted. +-----------------+-------------+----------+---------+ Right Cephalic   Diameter (cm)Depth (cm)Findings  +-----------------+-------------+----------+---------+ Shoulder             0.24                         +-----------------+-------------+----------+---------+  Mid upper arm        0.12        0.32   branching +-----------------+-------------+----------+---------+ Dist upper arm       0.09                         +-----------------+-------------+----------+---------+ Antecubital fossa    0.21                         +-----------------+-------------+----------+---------+ Prox forearm         0.20        0.30             +-----------------+-------------+----------+---------+ Mid forearm          0.26        0.35   branching +-----------------+-------------+----------+---------+ Dist forearm         0.22                         +-----------------+-------------+----------+---------+ +-----------------+-------------+----------+---------+ Right Basilic    Diameter (cm)Depth (cm)Findings  +-----------------+-------------+----------+---------+ Prox upper arm       0.26        1.29             +-----------------+-------------+----------+---------+ Mid upper arm        0.33        1.46             +-----------------+-------------+----------+---------+ Dist upper arm       0.36        0.83              +-----------------+-------------+----------+---------+ Antecubital fossa    0.34        0.75   branching +-----------------+-------------+----------+---------+ +-----------------+-------------+----------+---------+ Left Cephalic    Diameter (cm)Depth (cm)Findings  +-----------------+-------------+----------+---------+ Shoulder             0.39        0.65             +-----------------+-------------+----------+---------+ Mid upper arm        0.28                         +-----------------+-------------+----------+---------+ Dist upper arm       0.35        0.37   branching +-----------------+-------------+----------+---------+ Antecubital fossa    0.35                         +-----------------+-------------+----------+---------+ Prox forearm         0.14               branching +-----------------+-------------+----------+---------+ Mid forearm          0.11                         +-----------------+-------------+----------+---------+ +-----------------+-------------+----------+---------+ Left Basilic     Diameter (cm)Depth (cm)Findings  +-----------------+-------------+----------+---------+ Prox upper arm       0.50        1.24             +-----------------+-------------+----------+---------+ Mid upper arm        0.43        0.92   branching +-----------------+-------------+----------+---------+ Dist upper arm       0.33        0.45   branching +-----------------+-------------+----------+---------+ Antecubital fossa    0.32  branching +-----------------+-------------+----------+---------+ *See table(s) above for measurements and observations.  Diagnosing physician: Monica Martinez MD Electronically signed by Monica Martinez MD on 04/13/2020 at 3:15:13 PM.    Final     Microbiology: Recent Results (from the past 240 hour(s))  SARS CORONAVIRUS 2 (TAT 6-24 HRS) Nasopharyngeal Nasopharyngeal Swab     Status: None   Collection Time:  04/08/20  8:30 PM   Specimen: Nasopharyngeal Swab  Result Value Ref Range Status   SARS Coronavirus 2 NEGATIVE NEGATIVE Final    Comment: (NOTE) SARS-CoV-2 target nucleic acids are NOT DETECTED. The SARS-CoV-2 RNA is generally detectable in upper and lower respiratory specimens during the acute phase of infection. Negative results do not preclude SARS-CoV-2 infection, do not rule out co-infections with other pathogens, and should not be used as the sole basis for treatment or other patient management decisions. Negative results must be combined with clinical observations, patient history, and epidemiological information. The expected result is Negative. Fact Sheet for Patients: SugarRoll.be Fact Sheet for Healthcare Providers: https://www.woods-mathews.com/ This test is not yet approved or cleared by the [place] FDA and  has been authorized for detection and/or diagnosis of SARS-CoV-2 by FDA under an Emergency Use Authorization (EUA). This EUA will remain  in effect (meaning this test can be used) for the duration of the COVID-19 declaration under Section 56 4(b)(1) of the Act, 21 U.S.C. section 360bbb-3(b)(1), unless the authorization is terminated or revoked sooner. Performed at [facility], [place] [address], [place], [place] [address]   Surgical pcr screen     Status: None   Collection Time: 04/14/20  9:59 PM   Specimen: Nasal Mucosa; Nasal Swab  Result Value Ref Range Status   MRSA, PCR NEGATIVE NEGATIVE Final   Staphylococcus aureus NEGATIVE NEGATIVE Final    Comment: (NOTE) The Xpert SA Assay (FDA approved for NASAL specimens in patients 20 years of age and older), is one component of a comprehensive surveillance program. It is not intended to diagnose infection nor to guide or monitor treatment. Performed at [facility], [place] [address], [place], [place] [address]      Labs: Basic Metabolic Panel: Recent  Labs  Lab 04/09/20 0349 04/10/20 0759 04/11/20 0315 04/12/20 0356 04/13/20 0753 04/14/20 0530 04/15/20 0516  NA 132*   < > 134* 134* 134* 136 134*  K 5.5*   < > 4.7 4.5 4.5 4.2 4.9  CL 94*   < > 96* 93* 93* 98 93*  CO2 17*   < > '22 26 25 25 23  '$ GLUCOSE 106*   < > 110* 107* 113* 101* 85  BUN 118*   < > 75* 43* 63* 35* 59*  CREATININE 18.93*   < > 15.72* 10.79* 14.39* 10.21* 13.96*  CALCIUM 8.8*   < > 8.7* 9.3 9.0 8.8* 9.2  MG 2.2  --   --   --   --   --   --   PHOS  --    < > 7.5* 7.0* 8.0* 6.0* 6.6*   < > = values in this interval not displayed.   Liver Function Tests: Recent Labs  Lab 04/09/20 0349 04/10/20 0759 04/11/20 0315 04/12/20 0356 04/13/20 0753 04/14/20 0530 04/15/20 0516  AST 36  --   --   --   --   --   --   ALT 10  --   --   --   --   --   --   ALKPHOS 75  --   --   --   --   --   --  BILITOT 0.8  --   --   --   --   --   --   PROT 6.2*  --   --   --   --   --   --   ALBUMIN 3.1*   < > 3.0* 3.1* 3.1* 3.0* 3.0*   < > = values in this interval not displayed.   No results for input(s): LIPASE, AMYLASE in the last 168 hours. No results for input(s): AMMONIA in the last 168 hours. CBC: Recent Labs  Lab 04/11/20 0315 04/12/20 0356 04/13/20 0752 04/14/20 0530 04/15/20 0516  WBC 7.6 7.8 9.3 8.5 8.2  NEUTROABS 5.4 5.4 6.6 5.7 6.1  HGB 7.3* 7.4* 7.8* 7.9* 8.0*  HCT 23.0* 24.0* 25.1* 25.7* 25.8*  MCV 96.6 97.6 99.6 100.0 100.4*  PLT 104* 127* 126* 149* 191   Cardiac Enzymes: No results for input(s): CKTOTAL, CKMB, CKMBINDEX, TROPONINI in the last 168 hours. BNP: BNP (last 3 results) Recent Labs    03/31/20 1500  BNP 3,957.9*    ProBNP (last 3 results) No results for input(s): PROBNP in the last 8760 hours.  CBG: No results for input(s): GLUCAP in the last 168 hours.     Signed:  Alma Friendly, MD [facility] 04/15/2020, 2:32 PM

## 2020-04-15 NOTE — Interval H&P Note (Signed)
History and Physical Interval Note:  04/15/2020 9:05 AM  Douglas [name]  has presented today for surgery, with the diagnosis of CHRONIC KIDNEY DISEASE.  The various methods of treatment have been discussed with the patient and family. After consideration of risks, benefits and other options for treatment, the patient has consented to  Procedure(s): RIGHT ARTERIOVENOUS (AV) FISTULA CREATION VERSES ARTERIOVENOUS GRAFT (Right) as a surgical intervention.  The patient's history has been reviewed, patient examined, no change in status, stable for surgery.  I have reviewed the patient's chart and labs.  Questions were answered to the patient's satisfaction.     Curt Jews

## 2020-04-25 MED FILL — cloNIDine 0.3 MG/24HR PTWK: 0.3 | 28 days supply | Qty: 4 | Fill #0

## 2020-04-29 ENCOUNTER — Encounter (INDEPENDENT_AMBULATORY_CARE_PROVIDER_SITE_OTHER): Payer: Self-pay | Admitting: Primary Care

## 2020-04-29 ENCOUNTER — Other Ambulatory Visit: Payer: Self-pay

## 2020-04-29 ENCOUNTER — Telehealth (INDEPENDENT_AMBULATORY_CARE_PROVIDER_SITE_OTHER): Payer: Self-pay | Admitting: Primary Care

## 2020-04-29 VITALS — BP 200/142 | HR 96 | Temp 97.9°F | Resp 16 | Ht 77.0 in | Wt 218.0 lb

## 2020-04-29 DIAGNOSIS — F32A Depression, unspecified: Secondary | ICD-10-CM

## 2020-04-29 DIAGNOSIS — I129 Hypertensive chronic kidney disease with stage 1 through stage 4 chronic kidney disease, or unspecified chronic kidney disease: Secondary | ICD-10-CM

## 2020-04-29 DIAGNOSIS — F329 Major depressive disorder, single episode, unspecified: Secondary | ICD-10-CM

## 2020-04-29 DIAGNOSIS — N184 Chronic kidney disease, stage 4 (severe): Secondary | ICD-10-CM

## 2020-04-29 DIAGNOSIS — Z7689 Persons encountering health services in other specified circumstances: Secondary | ICD-10-CM

## 2020-04-29 DIAGNOSIS — Z09 Encounter for follow-up examination after completed treatment for conditions other than malignant neoplasm: Secondary | ICD-10-CM

## 2020-04-29 NOTE — Progress Notes (Signed)
Concerns that medications make him feel bad. Taken medication daily x 2 last days.

## 2020-04-29 NOTE — Progress Notes (Signed)
New Patient Office Visit  Subjective:  Patient ID: Douglas [name], male    DOB: 05-10-1986  Age: 34 y.o. MRN: [id number]  CC:  Chief Complaint  Patient presents with  . Blood Pressure Check  . Chronic Kidney Disease    HPI Douglas [name] presents for management of high blood pressure.  He presented today with blood pressure 200/142 underlining causative factors chronic kidney disease and on dialysis.  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  Past Medical History:  Diagnosis Date  . Asthma   . Brain injury ([place])   . COPD (chronic obstructive pulmonary disease) ([place])   . Hypertension   . Sleep apnea     Past Surgical History:  Procedure Laterality Date  . AV FISTULA PLACEMENT Right 04/15/2020   Procedure: RIGHT ARTERIOVENOUS (AV) FISTULA CREATION;  Surgeon: Douglas Posner, MD;  Location: [facility] OR;  Service: Vascular;  Laterality: Right;  . IR FLUORO GUIDE CV LINE RIGHT  04/03/2020  . IR US GUIDE VASC ACCESS RIGHT  04/03/2020  . MYRINGOTOMY      No family history on file.  Social History   Socioeconomic History  . Marital status: Single    Spouse name: Not on file  . Number of children: Not on file  . Years of education: Not on file  . Highest education level: Not on file  Occupational History  . Not on file  Tobacco Use  . Smoking status: Current Every Day Smoker    Packs/day: 0.25    Types: Cigarettes  . Smokeless tobacco: Current User  Substance and Sexual Activity  . Alcohol use: Yes  . Drug use: Yes    Types: Marijuana  . Sexual activity: Not on file  Other Topics Concern  . Not on file  Social History Narrative  . Not on file   Social Determinants of Health   Financial Resource Strain:   . Difficulty of Paying Living Expenses:    Food Insecurity:   . Worried About Charity fundraiser in the Last Year:   . Arboriculturist in the Last Year:   Transportation Needs:   . Film/video editor (Medical):   Marland Kitchen Lack of Transportation (Non-Medical):   Physical Activity:   . Days of Exercise per Week:   . Minutes of Exercise per Session:   Stress:   . Feeling of Stress :   Social Connections:   . Frequency of Communication with Friends and Family:   . Frequency of Social Gatherings with Friends and Family:   . Attends Religious Services:   . Active Member of Clubs or Organizations:   . Attends Archivist Meetings:   Marland Kitchen Marital Status:   Intimate Partner Violence:   . Fear of Current or Ex-Partner:   . Emotionally Abused:   Marland Kitchen Physically Abused:   . Sexually Abused:     ROS Review of Systems  Genitourinary: Positive for decreased urine volume.  Neurological: Positive for headaches.  All other systems reviewed and are negative.   Objective:   Today's Vitals: BP (!) 200/142 (BP Location: Left Arm, Patient Position: Sitting, Cuff Size: Large)   Pulse 96   Temp 97.9 F (36.6 C)   Resp 16   Ht 6\' 5"  (1.956 m)   Wt 218 lb (98.9 kg)  SpO2 98%   BMI 25.85 kg/m   Physical Exam Vitals reviewed.  Constitutional:      Appearance: Normal appearance. He is obese.  HENT:     Head: Normocephalic.     Right Ear: Tympanic membrane normal.     Left Ear: Tympanic membrane normal.     Nose: Nose normal.  Cardiovascular:     Rate and Rhythm: Normal rate and regular rhythm.  Pulmonary:     Effort: Pulmonary effort is normal.     Breath sounds: Normal breath sounds.  Abdominal:     General: Bowel sounds are normal.  Musculoskeletal:        General: Normal range of motion.     Cervical back: Normal range of motion and neck supple.  Skin:    General: Skin is warm.  Neurological:     Mental Status: He is alert and oriented to person, place, and time.  Psychiatric:        Mood and Affect: Mood normal.         Behavior: Behavior normal.        Thought Content: Thought content normal.        Judgment: Judgment normal.     Assessment & Plan:   Douglas [name] was seen today for blood pressure check and chronic kidney disease.  Diagnoses and all orders for this visit:  Hypertensive renal disease, malignant, with renal failure Managed by nephrology poor controlled increase amlodipine to 10 mg on last hospital visit hospital notes indicated likely to increase Coreg per Dr.Joseph A [name].  Chronic kidney disease (CKD), stage IV (severe) (HCC) ESRD- continue with HD on Tuesday, Thursday, and Saturday schedule Managed by nephrology on dialysis.   Encounter to establish care Douglas Mire, NP-C will be your  (PCP) she is mastered prepared . She is skilled to diagnosed and treat illness. Also able to answer health concern as well as continuing care of varied medical conditions, not limited by cause, organ system, or diagnosis.   Hospital discharge follow-up Hospital follow-up from March 31, 2020 with AMA returned on April 08, 2020 with unspecific renal failure.  Hypertensive renal disease malignant with renal failure, treated in the hospital with dialysis, anemia due to chronic kidney disease, thrombocytopenia, hyperkalemia and acute noncardiogenic pulmonary edema.  To follow-up with Dr. Curt [name] vascular on 05/27/2020.  Today's visit was to establish primary care.  He will continue and made aware of nephrology will be managing his renal failure and hypertensive urgency secondary to malignant with renal failure  Depression, unspecified depression type   Telemedicine from 04/29/2020 in [facility]  PHQ-2 Total Score  2    Score does not indicate discussion and problems patient is having with age and chronic disease 1 in determinant to continue to work despite challenges he is having with his health.  Discussed follow-up to be made with clinical social worker and  reevaluate.  Outpatient Encounter Medications as of 04/29/2020  Medication Sig  . amLODipine (NORVASC) 10 MG tablet Take 1 tablet (10 mg total) by mouth daily.  . calcitRIOL (ROCALTROL) 0.5 MCG capsule Take 1 capsule (0.5 mcg total) by mouth Every Tuesday,Thursday,and Saturday with dialysis.  Marland Kitchen carvedilol (COREG) 25 MG tablet Take 1 tablet (25 mg total) by mouth 2 (two) times daily with a meal.  . hydrALAZINE (APRESOLINE) 100 MG tablet Take 1 tablet (100 mg total) by mouth 2 (two) times daily.  . multivitamin (RENA-VIT) TABS tablet Take 1 tablet by mouth at bedtime.  Marland Kitchen  sevelamer carbonate (RENVELA) 800 MG tablet Take 3 tablets (2,400 mg total) by mouth 3 (three) times daily with meals.   No facility-administered encounter medications on file as of 04/29/2020.    Follow-up: Return in about 3 months (around 07/30/2020) for ASAP in person CSW than f/u 3 months PCP.   Kerin Perna, NP

## 2020-04-29 NOTE — Patient Instructions (Signed)
Chronic Kidney Disease, Adult Chronic kidney disease (CKD) occurs when the kidneys become damaged slowly over a long period of time. The kidneys are a pair of organs that do many important jobs in the body, including:  Removing waste and extra fluid from the blood to make urine.  Making hormones that maintain the amount of fluid in tissues and blood vessels.  Maintaining the right amount of fluids and chemicals in the body. A small amount of kidney damage may not cause problems, but a large amount of damage may make it hard or impossible for the kidneys to work the way they should. If steps are not taken to slow down kidney damage or to stop it from getting worse, the kidneys may stop working permanently (end-stage renal disease or ESRD). Most of the time, CKD does not go away, but it can often be controlled. People who have CKD are usually able to live normal lives. What are the causes? The most common causes of this condition are diabetes and high blood pressure (hypertension). Other causes include:  Heart and blood vessel (cardiovascular) disease.  Kidney diseases, such as: ? Glomerulonephritis. ? Interstitial nephritis. ? Polycystic kidney disease. ? Renal vascular disease.  Diseases that affect the immune system.  Genetic diseases.  Medicines that damage the kidneys, such as anti-inflammatory medicines.  Being around or being in contact with poisonous (toxic) substances.  A kidney or urinary infection that occurs again and again (recurs).  Vasculitis. This is swelling or inflammation of the blood vessels.  A problem with urine flow that may be caused by: ? Cancer. ? Having kidney stones more than one time. ? An enlarged prostate, in males. What increases the risk? You are more likely to develop this condition if you:  Are older than age 60.  Are male.  Are African-American, Hispanic, Asian, Pacific Islander, or American Indian.  Are a current or former smoker.   Are obese.  Have a family history of kidney disease or failure.  Often take medicines that are damaging to the kidneys. What are the signs or symptoms? Symptoms of this condition include:  Swelling (edema) of the face, legs, ankles, or feet.  Tiredness (lethargy) and having less energy.  Nausea or vomiting.  Confusion or trouble concentrating.  Problems with urination, such as: ? Painful or burning feeling during urination. ? Decreased urine production. ? Frequent urination, especially at night. ? Bloody urine.  Muscle twitches and cramps, especially in the legs.  Shortness of breath.  Weakness.  Loss of appetite.  Metallic taste in the mouth.  Trouble sleeping.  Dry, itchy skin.  A low blood count (anemia).  Pale lining of the eyelids and surface of the eye (conjunctiva). Symptoms develop slowly and may not be obvious until the kidney damage becomes severe. It is possible to have kidney disease for years without having any symptoms. How is this diagnosed? This condition may be diagnosed based on:  Blood tests.  Urine tests.  Imaging tests, such as an ultrasound or CT scan.  A test in which a sample of tissue is removed from the kidneys to be examined under a microscope (kidney biopsy). These test results will help your health care provider determine how serious the CKD is. How is this treated? There is no cure for most cases of this condition, but treatment usually relieves symptoms and prevents or slows the progression of the disease. Treatment may include:  Making diet changes, which may require you to avoid alcohol, salty foods (sodium),   and foods that are high in potassium, calcium, and protein.  Medicines: ? To lower blood pressure. ? To control blood glucose. ? To relieve anemia. ? To relieve swelling. ? To protect your bones. ? To improve the balance of electrolytes in your blood.  Removing toxic waste from the body through types of dialysis, if  the kidneys can no longer do their job (kidney failure).  Managing any other conditions that are causing your CKD or making it worse. Follow these instructions at home: Medicines  Take over-the-counter and prescription medicines only as told by your health care provider. The dose of some medicines that you take may need to be adjusted.  Do not take any new medicines unless approved by your health care provider. Many medicines can worsen your kidney damage.  Do not take any vitamin and mineral supplements unless approved by your health care provider. Many nutritional supplements can worsen your kidney damage. General instructions  Follow your prescribed diet as told by your health care provider.  Do not use any products that contain nicotine or tobacco, such as cigarettes and e-cigarettes. If you need help quitting, ask your health care provider.  Monitor and track your blood pressure at home. Report changes in your blood pressure as told by your health care provider.  If you are being treated for diabetes, monitor and track your blood sugar (blood glucose) levels as told by your health care provider.  Maintain a healthy weight. If you need help with this, ask your health care provider.  Start or continue an exercise plan. Exercise at least 30 minutes a day, 5 days a week.  Keep your immunizations up to date as told by your health care provider.  Keep all follow-up visits as told by your health care provider. This is important. Where to find more information  American Association of Kidney Patients: www.aakp.org  National Kidney Foundation: www.kidney.org  American Kidney Fund: www.akfinc.org  Life Options Rehabilitation Program: www.lifeoptions.org and www.kidneyschool.org Contact a health care provider if:  Your symptoms get worse.  You develop new symptoms. Get help right away if:  You develop symptoms of ESRD, which include: ? Headaches. ? Numbness in the hands or  feet. ? Easy bruising. ? Frequent hiccups. ? Chest pain. ? Shortness of breath. ? Lack of menstruation, in women.  You have a fever.  You have decreased urine production.  You have pain or bleeding when you urinate. Summary  Chronic kidney disease (CKD) occurs when the kidneys become damaged slowly over a long period of time.  The most common causes of this condition are diabetes and high blood pressure (hypertension).  There is no cure for most cases of this condition, but treatment usually relieves symptoms and prevents or slows the progression of the disease. Treatment may include a combination of medicines and lifestyle changes. This information is not intended to replace advice given to you by your health care provider. Make sure you discuss any questions you have with your health care provider. Document Revised: 11/26/2017 Document Reviewed: 01/21/2017 Elsevier Patient Education  2020 [employer] Inc.  

## 2020-05-07 ENCOUNTER — Telehealth (INDEPENDENT_AMBULATORY_CARE_PROVIDER_SITE_OTHER): Payer: Self-pay | Admitting: Licensed Clinical Social Worker

## 2020-05-07 ENCOUNTER — Institutional Professional Consult (permissible substitution) (INDEPENDENT_AMBULATORY_CARE_PROVIDER_SITE_OTHER): Payer: Self-pay | Admitting: Licensed Clinical Social Worker

## 2020-05-07 NOTE — Telephone Encounter (Signed)
Call placed to patient regarding scheduled [facility] appointment. LCSW left message requesting a return call.  

## 2020-05-14 ENCOUNTER — Other Ambulatory Visit: Payer: Self-pay | Admitting: *Deleted

## 2020-05-14 DIAGNOSIS — N186 End stage renal disease: Secondary | ICD-10-CM

## 2020-05-21 ENCOUNTER — Other Ambulatory Visit: Payer: Self-pay

## 2020-05-21 ENCOUNTER — Ambulatory Visit (INDEPENDENT_AMBULATORY_CARE_PROVIDER_SITE_OTHER): Payer: Self-pay | Admitting: Physician Assistant

## 2020-05-21 ENCOUNTER — Ambulatory Visit (HOSPITAL_COMMUNITY)
Admission: RE | Admit: 2020-05-21 | Discharge: 2020-05-21 | Disposition: A | Discharge status: Home / Self Care | Payer: Medicaid Other | Source: Ambulatory Visit | Attending: Vascular Surgery | Admitting: Vascular Surgery

## 2020-05-21 VITALS — BP 238/148 | HR 99 | Temp 99.1°F | Resp 22 | Ht 77.0 in | Wt 218.0 lb

## 2020-05-21 DIAGNOSIS — N186 End stage renal disease: Secondary | ICD-10-CM

## 2020-05-21 NOTE — Progress Notes (Signed)
  POST OPERATIVE DIALYSIS ACCESS OFFICE NOTE    CC:  F/u for dialysis access surgery  HPI:  This is a 34 y.o. male who is s/p right upper arm for stage brachiobasilic fistula on 01/19/4824 by Dr. Donnetta Hutching. His mother accompanies him today. He is short of breath at rest and complains of LE extremity edema. C/o incisional pain but denies hand pain   Dialysis days:  Tuesdays, Thursdays and Saturdays via right IJ TDC (IR) Dialysis center: [facility], [place]  Allergies  Allergen Reactions  . Pork-Derived Products Nausea And Vomiting    Current Outpatient Medications  Medication Sig Dispense Refill  . cloNIDine (CATAPRES - DOSED IN MG/24 HR) 0.3 mg/24hr patch SMARTSIG:1 Patch(s) Topical Once a Week PRN    . CLONIDINE HCL PO Take by mouth.    . Methoxy PEG-Epoetin Beta (MIRCERA IJ) Mircera    . Multiple Vitamin (MULTIVITAMIN ADULT PO) Take by mouth.    . sevelamer carbonate (RENVELA) 800 MG tablet Take by mouth.    . Tuberculin PPD (TUBERSOL ID) Inject into the skin.    Marland Kitchen amLODipine (NORVASC) 10 MG tablet Take 1 tablet (10 mg total) by mouth daily. 30 tablet 0  . carvedilol (COREG) 25 MG tablet Take 1 tablet (25 mg total) by mouth 2 (two) times daily with a meal. 60 tablet 0  . hydrALAZINE (APRESOLINE) 100 MG tablet Take 1 tablet (100 mg total) by mouth 2 (two) times daily. 60 tablet 0   No current facility-administered medications for this visit.     ROS:  See HPI  Vitals:   05/21/20 1105  BP: (!) 238/148  Pulse: 99  Resp: (!) 22  Temp: 99.1 F (37.3 C)  SpO2: 96%    Physical Exam:  General appearance: in mild distress secondary to dyspnea Cardiac: RRR Respiratory: dyspneic; bilateral rales R>L Incision:  Healing without signs of infection Extremities:  Hand warm with AROM/intact sensation. 5/5 grip strength. 2+ radial pulse. Good thrill and bruit in fistula  Dialysis duplex on 05/21/2020 OUTFLOW VEINPSV (cm/s)Diameter (cm)Depth (cm)  Describe      +------------+----------+-------------+----------+----------------+  Prox UA     92    0.65     0.92   Retained valve   +------------+----------+-------------+----------+----------------+  Mid UA     140    0.68     0.61  competing branch  +------------+----------+-------------+----------+----------------+  Dist UA     508    0.70     0.45   Retained valve   +------------+----------+-------------+----------+----------------+  AC Fossa    600    0.38     0.34  competing branch  +------------+----------+-------------+----------+----------------+   Assessment/Plan:   -pt in volume overload and has chair time today next door at [address] -pt does not have evidence of steal syndrome -dialysis duplex today reveals fistula to be of adequate diameter to proceed with second stage  Discussed with patient, his mother and staff that he needs to proceed directly to HD clinic.  His mother will RTC to schedule 2nd stage BVF today or we will call to set up surgery.  Barbie Banner, PA-C 05/21/2020 11:28 AM [facility] [phone]  Clinic MD:  Carlis Abbott

## 2020-05-23 ENCOUNTER — Encounter (HOSPITAL_COMMUNITY): Payer: Self-pay | Admitting: Vascular Surgery

## 2020-05-23 ENCOUNTER — Other Ambulatory Visit: Payer: Self-pay

## 2020-05-23 NOTE — Progress Notes (Signed)
SDW-pre-op call completed by pt mother Deth. Mother denies any acute SOB. Mother denies that pt C/O chest pain. Mother denies that pt is under the care of a cardiologist. Mother denies that pt had a stress test and cardiac cath. Mother made aware to have pt stop taking vitamins, fish oil and herbal medications. Do not take any NSAIDs ie: Ibuprofen, Advil, Naproxen (Aleve), Motrin, BC and Goody Powder. Mother reminded to have pt quarantine. Mother verbalized understanding of all pre-op instructions.

## 2020-05-24 ENCOUNTER — Encounter (HOSPITAL_COMMUNITY): Payer: Self-pay | Admitting: Physician Assistant

## 2020-05-28 ENCOUNTER — Other Ambulatory Visit (HOSPITAL_COMMUNITY): Payer: Self-pay

## 2020-05-28 ENCOUNTER — Other Ambulatory Visit: Payer: Self-pay

## 2020-05-28 ENCOUNTER — Emergency Department (HOSPITAL_COMMUNITY): Payer: Medicaid Other

## 2020-05-28 ENCOUNTER — Emergency Department (HOSPITAL_COMMUNITY)
Admission: EM | Admit: 2020-05-28 | Discharge: 2020-05-28 | Disposition: A | Discharge status: Home / Self Care | Payer: Medicaid Other | Attending: Emergency Medicine | Admitting: Emergency Medicine

## 2020-05-28 ENCOUNTER — Encounter (HOSPITAL_COMMUNITY): Payer: Self-pay | Admitting: Emergency Medicine

## 2020-05-28 DIAGNOSIS — R1110 Vomiting, unspecified: Secondary | ICD-10-CM | POA: Diagnosis not present

## 2020-05-28 DIAGNOSIS — R0602 Shortness of breath: Secondary | ICD-10-CM | POA: Insufficient documentation

## 2020-05-28 DIAGNOSIS — Z5321 Procedure and treatment not carried out due to patient leaving prior to being seen by health care provider: Secondary | ICD-10-CM | POA: Insufficient documentation

## 2020-05-28 LAB — CBC WITH DIFFERENTIAL/PLATELET
Abs Immature Granulocytes: 0.04 10*3/uL (ref 0.00–0.07)
Basophils Absolute: 0 10*3/uL (ref 0.0–0.1)
Basophils Relative: 0 %
Eosinophils Absolute: 0.1 10*3/uL (ref 0.0–0.5)
Eosinophils Relative: 1 %
HCT: 33.1 % — ABNORMAL LOW (ref 39.0–52.0)
Hemoglobin: 10.9 g/dL — ABNORMAL LOW (ref 13.0–17.0)
Immature Granulocytes: 0 %
Lymphocytes Relative: 8 %
Lymphs Abs: 0.7 10*3/uL (ref 0.7–4.0)
MCH: 28.6 pg (ref 26.0–34.0)
MCHC: 32.9 g/dL (ref 30.0–36.0)
MCV: 86.9 fL (ref 80.0–100.0)
Monocytes Absolute: 0.6 10*3/uL (ref 0.1–1.0)
Monocytes Relative: 7 %
Neutro Abs: 7.4 10*3/uL (ref 1.7–7.7)
Neutrophils Relative %: 84 %
Platelets: 146 10*3/uL — ABNORMAL LOW (ref 150–400)
RBC: 3.81 MIL/uL — ABNORMAL LOW (ref 4.22–5.81)
RDW: 15.4 % (ref 11.5–15.5)
WBC: 8.9 10*3/uL (ref 4.0–10.5)
nRBC: 0 % (ref 0.0–0.2)

## 2020-05-28 LAB — COMPREHENSIVE METABOLIC PANEL
ALT: 17 U/L (ref 0–44)
AST: 19 U/L (ref 15–41)
Albumin: 3.3 g/dL — ABNORMAL LOW (ref 3.5–5.0)
Alkaline Phosphatase: 88 U/L (ref 38–126)
Anion gap: 24 — ABNORMAL HIGH (ref 5–15)
BUN: 84 mg/dL — ABNORMAL HIGH (ref 6–20)
CO2: 20 mmol/L — ABNORMAL LOW (ref 22–32)
Calcium: 9.2 mg/dL (ref 8.9–10.3)
Chloride: 87 mmol/L — ABNORMAL LOW (ref 98–111)
Creatinine, Ser: 16.37 mg/dL — ABNORMAL HIGH (ref 0.61–1.24)
GFR calc Af Amer: 4 mL/min — ABNORMAL LOW (ref >60)
GFR calc non Af Amer: 3 mL/min — ABNORMAL LOW (ref >60)
Glucose, Bld: 133 mg/dL — ABNORMAL HIGH (ref 70–99)
Potassium: 5.4 mmol/L — ABNORMAL HIGH (ref 3.5–5.1)
Sodium: 131 mmol/L — ABNORMAL LOW (ref 135–145)
Total Bilirubin: 1.1 mg/dL (ref 0.3–1.2)
Total Protein: 6.6 g/dL (ref 6.5–8.1)

## 2020-05-28 IMAGING — CR DG CHEST 2V
3 series · 3 of 3 positions shown · non-contrast
Comparison: [DATE]

CLINICAL DATA: Shortness of breath

EXAM:
CHEST - 2 VIEW

[chest lat]
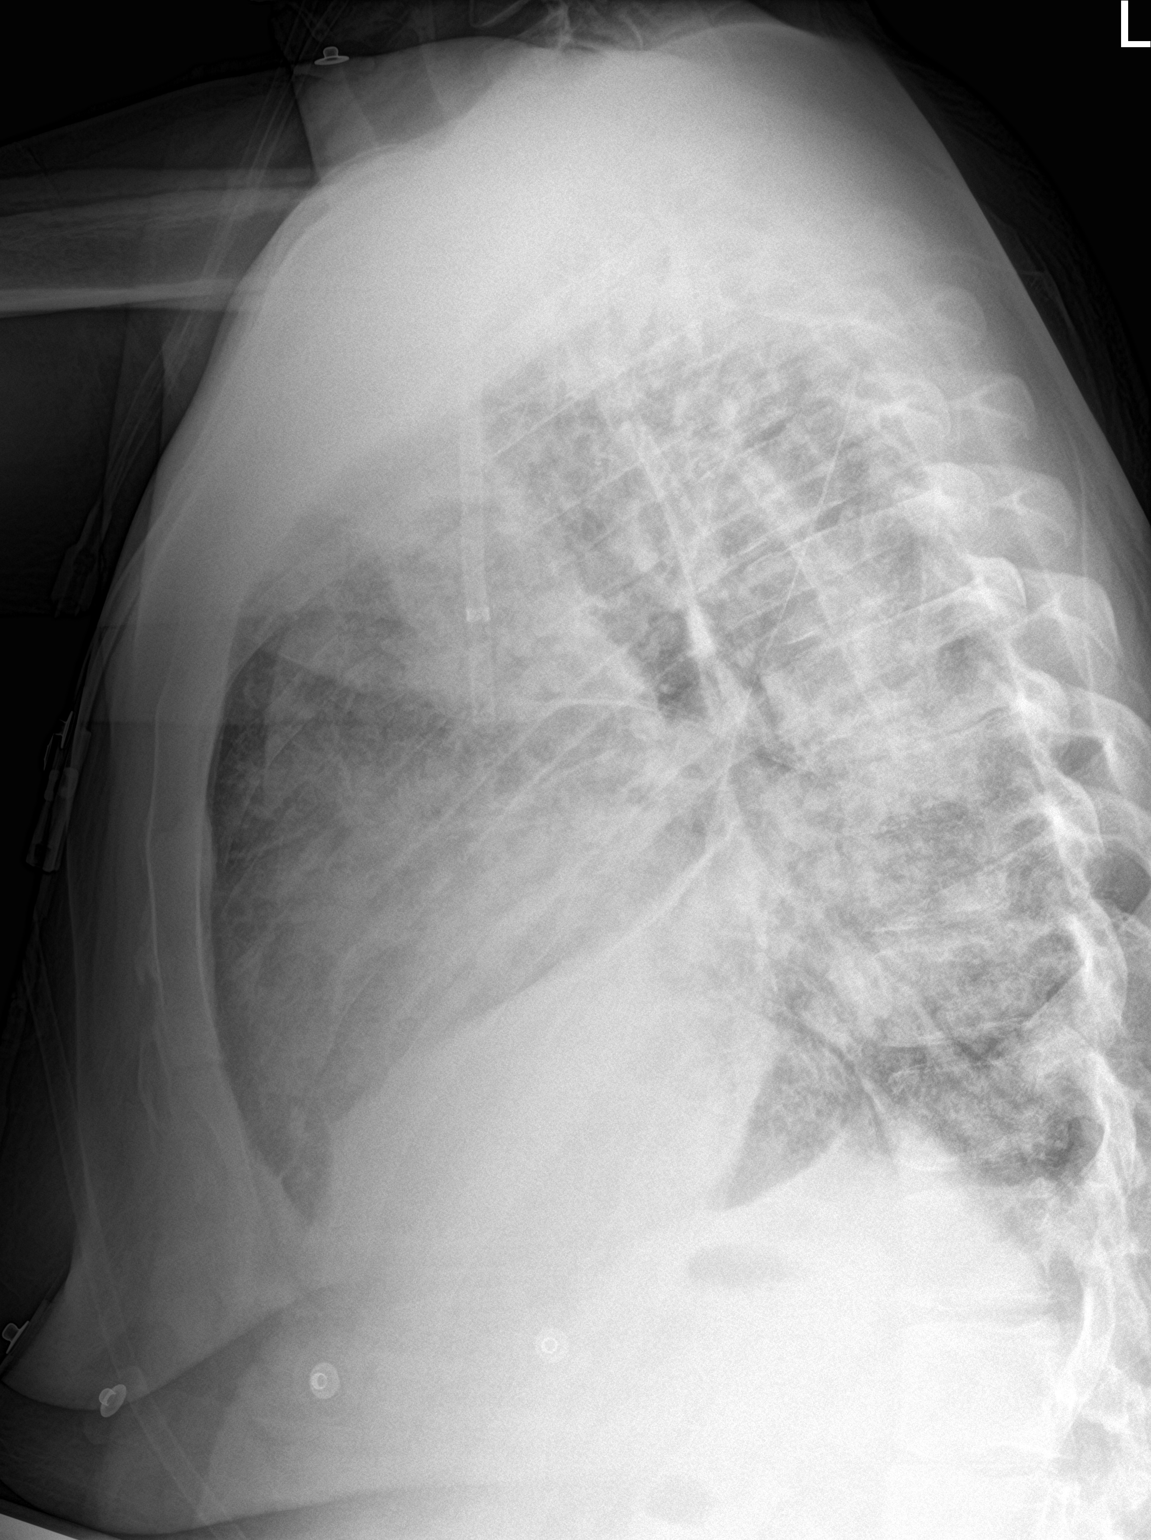

[chest ap (1 of 2)]
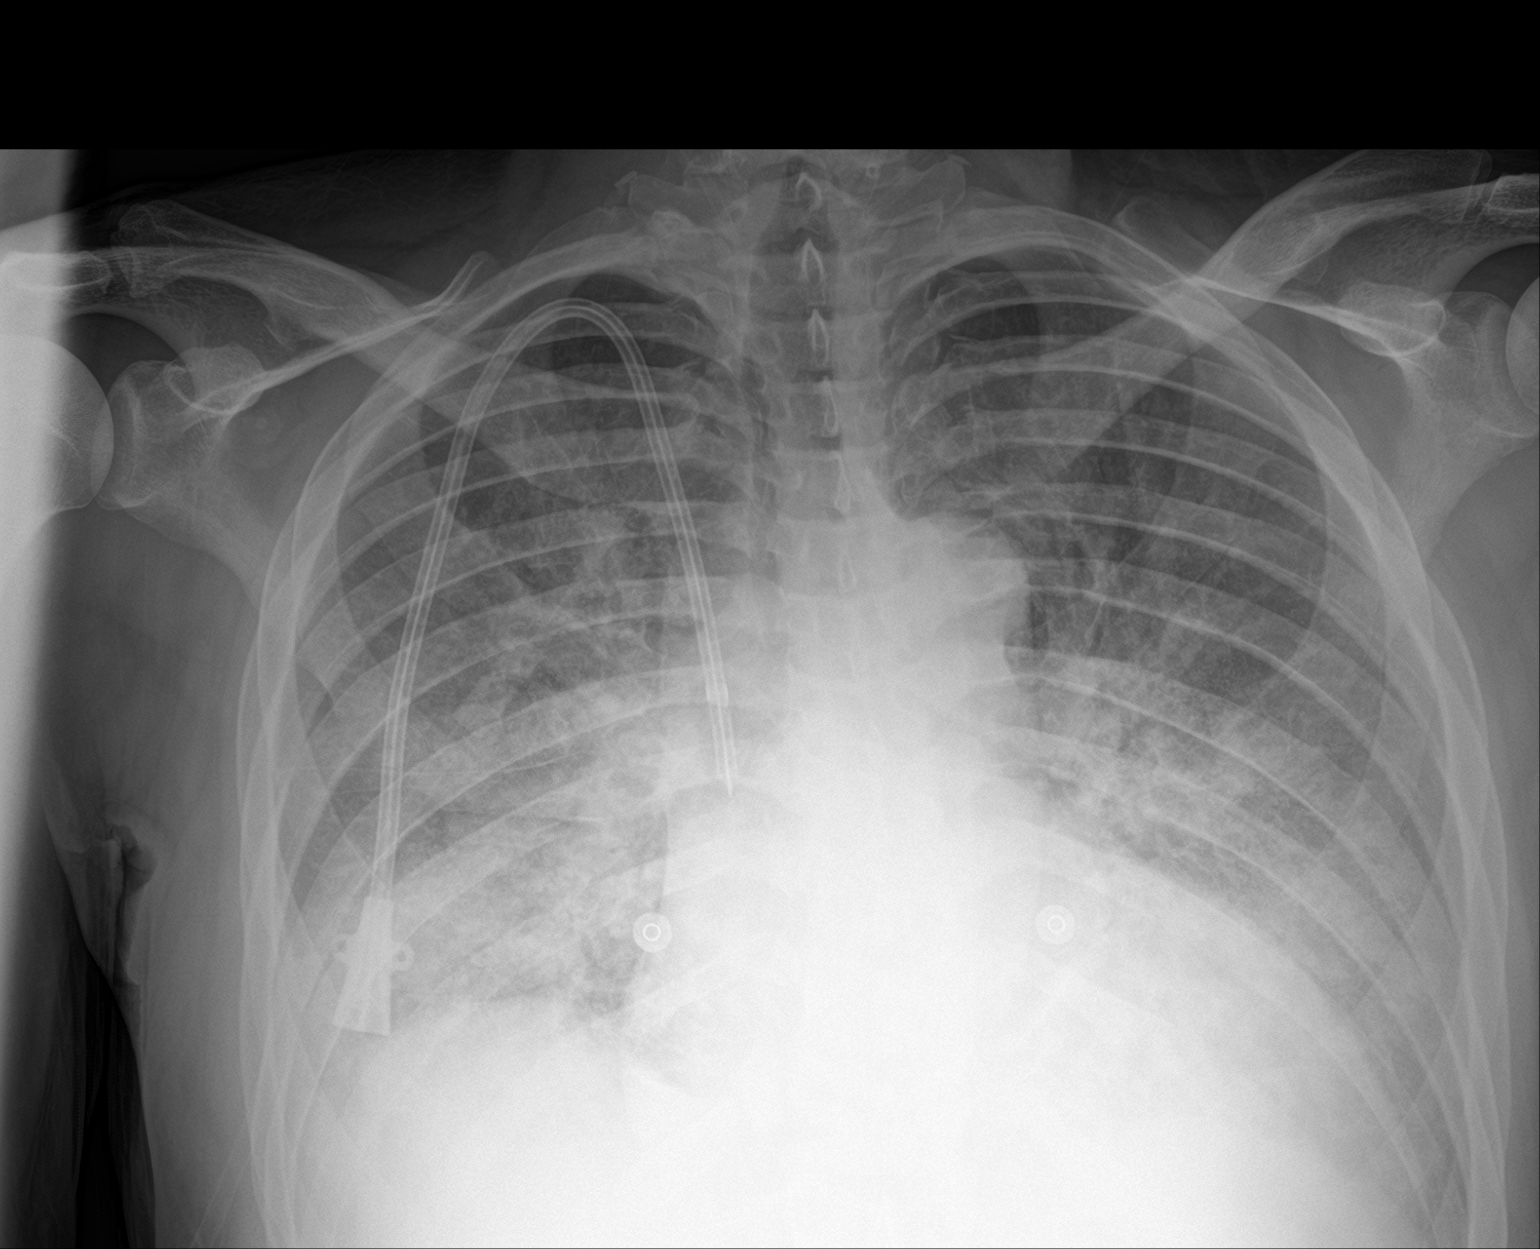

[chest ap (2 of 2)]
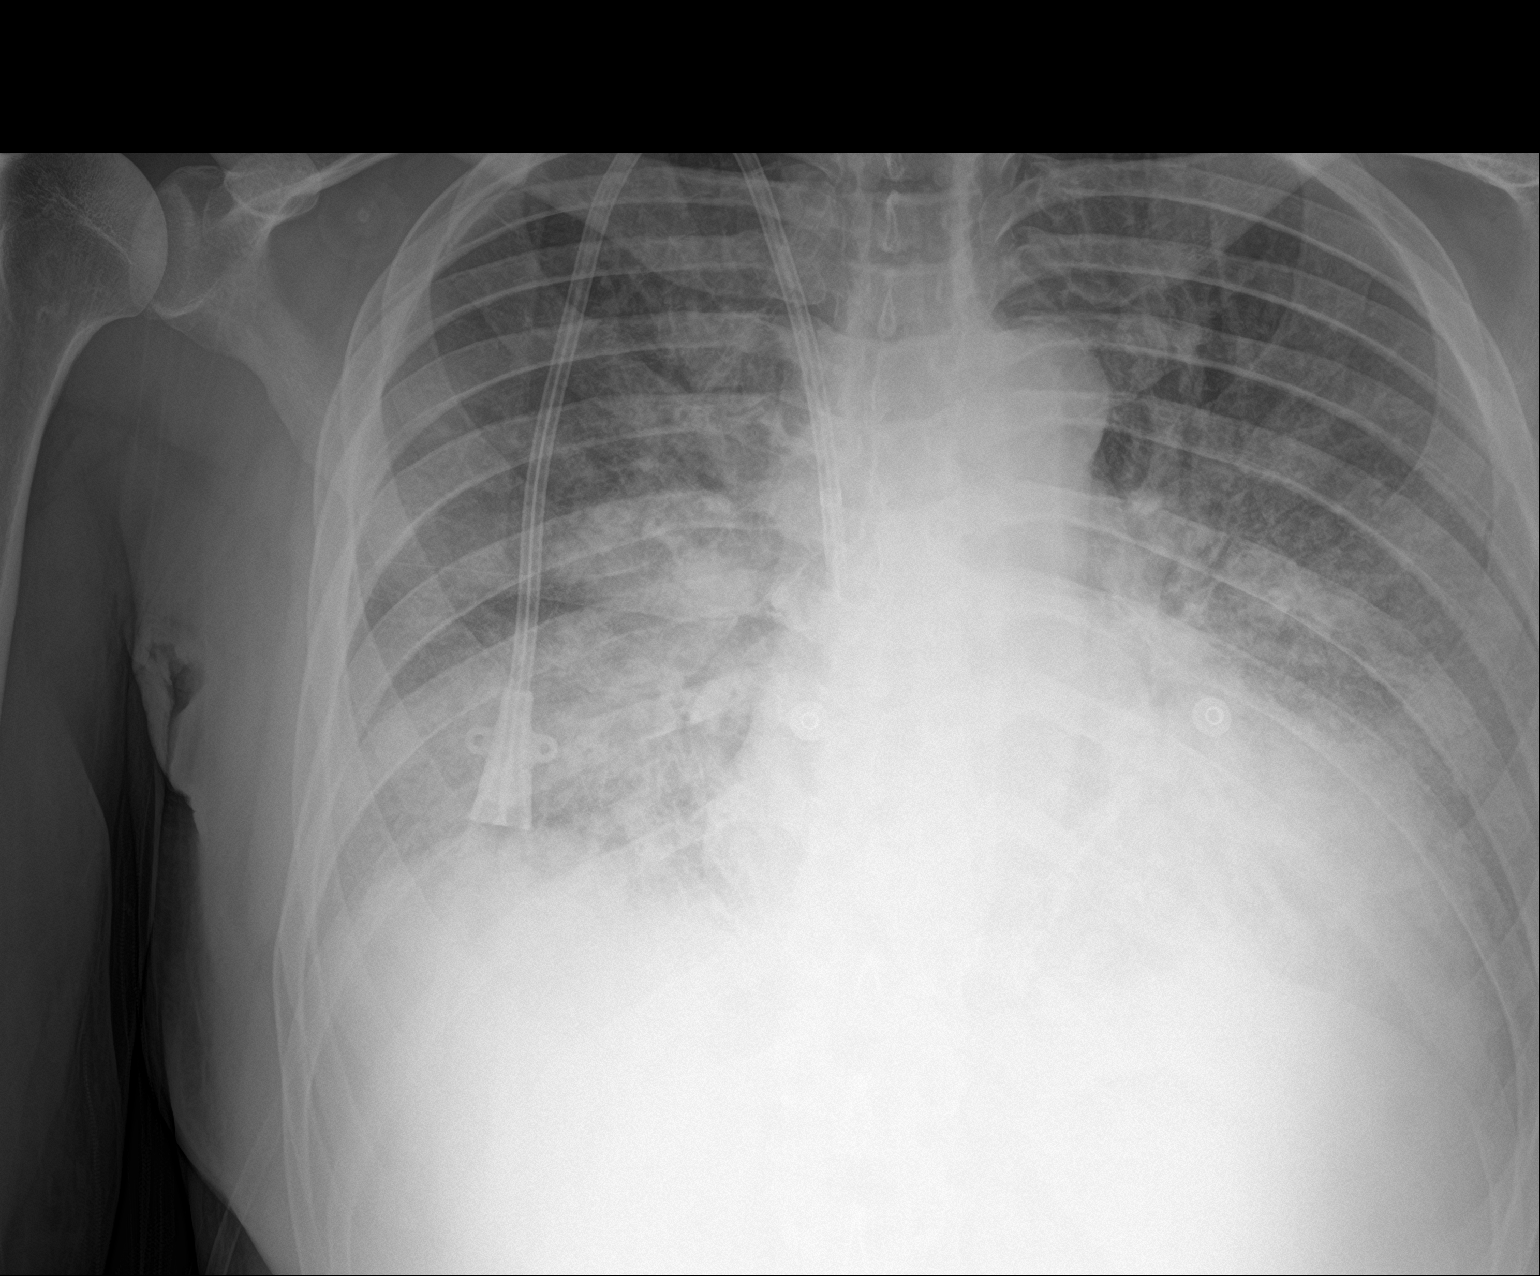

[3 of 3 positions shown; findings below may reference images not displayed]

FINDINGS: There is moderate cardiomegaly. Diffuse fluffy interstitial opacity
seen throughout both lungs. There is a small bilateral pleural
effusions, right greater than left. Right-sided central venous
catheter is seen with the tip at the superior cavoatrial junction.
IMPRESSION: Interval worsening in the diffuse pulmonary edema and small
bilateral pleural effusions.

## 2020-05-28 MED ORDER — ONDANSETRON 4 MG PO TBDP
4.0000 mg | ORAL_TABLET | Freq: Once | ORAL | Status: AC
  Administered 2020-05-28: 4 mg via ORAL
  Filled 2020-05-28: qty 1

## 2020-05-28 NOTE — ED Triage Notes (Signed)
Patient arrived with EMS from home reports SOB and emesis this evening , hemodialysis last Saturday , denies fever or chills.

## 2020-05-28 NOTE — ED Notes (Signed)
Called for vitals x3 no answer

## 2020-05-29 ENCOUNTER — Ambulatory Visit (HOSPITAL_COMMUNITY): Admission: RE | Admit: 2020-05-29 | Payer: Medicaid Other | Source: Home / Self Care | Admitting: Vascular Surgery

## 2020-05-29 HISTORY — DX: Anxiety disorder, unspecified: F41.9

## 2020-05-29 HISTORY — DX: Presence of spectacles and contact lenses: Z97.3

## 2020-05-29 HISTORY — DX: Headache, unspecified: R51.9

## 2020-05-29 HISTORY — DX: Pneumonia, unspecified organism: J18.9

## 2020-05-29 HISTORY — DX: Depression, unspecified: F32.A

## 2020-05-29 HISTORY — DX: Chronic kidney disease, unspecified: N18.9

## 2020-05-29 SURGERY — TRANSPOSITION, VEIN, BASILIC
Anesthesia: Monitor Anesthesia Care | Laterality: Right

## 2020-06-11 ENCOUNTER — Encounter (HOSPITAL_COMMUNITY): Payer: Self-pay

## 2020-06-11 ENCOUNTER — Other Ambulatory Visit: Payer: Self-pay

## 2020-06-11 ENCOUNTER — Emergency Department (HOSPITAL_COMMUNITY): Payer: Medicaid Other

## 2020-06-11 ENCOUNTER — Inpatient Hospital Stay (HOSPITAL_COMMUNITY)
Admission: EM | Admit: 2020-06-11 | Discharge: 2020-06-16 | DRG: 640 | Disposition: A | Discharge status: Home Health | Payer: Medicaid Other | Attending: Family Medicine | Admitting: Family Medicine

## 2020-06-11 DIAGNOSIS — T43595A Adverse effect of other antipsychotics and neuroleptics, initial encounter: Secondary | ICD-10-CM | POA: Diagnosis not present

## 2020-06-11 DIAGNOSIS — F17210 Nicotine dependence, cigarettes, uncomplicated: Secondary | ICD-10-CM | POA: Diagnosis present

## 2020-06-11 DIAGNOSIS — N2581 Secondary hyperparathyroidism of renal origin: Secondary | ICD-10-CM | POA: Diagnosis present

## 2020-06-11 DIAGNOSIS — Z8249 Family history of ischemic heart disease and other diseases of the circulatory system: Secondary | ICD-10-CM

## 2020-06-11 DIAGNOSIS — Z20822 Contact with and (suspected) exposure to covid-19: Secondary | ICD-10-CM | POA: Diagnosis present

## 2020-06-11 DIAGNOSIS — E871 Hypo-osmolality and hyponatremia: Secondary | ICD-10-CM | POA: Diagnosis present

## 2020-06-11 DIAGNOSIS — J449 Chronic obstructive pulmonary disease, unspecified: Secondary | ICD-10-CM | POA: Diagnosis present

## 2020-06-11 DIAGNOSIS — N189 Chronic kidney disease, unspecified: Secondary | ICD-10-CM | POA: Diagnosis present

## 2020-06-11 DIAGNOSIS — T447X6A Underdosing of beta-adrenoreceptor antagonists, initial encounter: Secondary | ICD-10-CM | POA: Diagnosis present

## 2020-06-11 DIAGNOSIS — I248 Other forms of acute ischemic heart disease: Secondary | ICD-10-CM | POA: Diagnosis present

## 2020-06-11 DIAGNOSIS — F329 Major depressive disorder, single episode, unspecified: Secondary | ICD-10-CM | POA: Diagnosis present

## 2020-06-11 DIAGNOSIS — F419 Anxiety disorder, unspecified: Secondary | ICD-10-CM | POA: Diagnosis present

## 2020-06-11 DIAGNOSIS — I161 Hypertensive emergency: Secondary | ICD-10-CM | POA: Diagnosis present

## 2020-06-11 DIAGNOSIS — F0781 Postconcussional syndrome: Secondary | ICD-10-CM | POA: Diagnosis present

## 2020-06-11 DIAGNOSIS — Z91138 Patient's unintentional underdosing of medication regimen for other reason: Secondary | ICD-10-CM

## 2020-06-11 DIAGNOSIS — Z825 Family history of asthma and other chronic lower respiratory diseases: Secondary | ICD-10-CM

## 2020-06-11 DIAGNOSIS — J9601 Acute respiratory failure with hypoxia: Secondary | ICD-10-CM | POA: Diagnosis present

## 2020-06-11 DIAGNOSIS — T461X6A Underdosing of calcium-channel blockers, initial encounter: Secondary | ICD-10-CM | POA: Diagnosis present

## 2020-06-11 DIAGNOSIS — Z841 Family history of disorders of kidney and ureter: Secondary | ICD-10-CM

## 2020-06-11 DIAGNOSIS — R4189 Other symptoms and signs involving cognitive functions and awareness: Secondary | ICD-10-CM | POA: Diagnosis present

## 2020-06-11 DIAGNOSIS — R1011 Right upper quadrant pain: Secondary | ICD-10-CM | POA: Diagnosis present

## 2020-06-11 DIAGNOSIS — G9341 Metabolic encephalopathy: Secondary | ICD-10-CM | POA: Diagnosis present

## 2020-06-11 DIAGNOSIS — E8779 Other fluid overload: Principal | ICD-10-CM | POA: Diagnosis present

## 2020-06-11 DIAGNOSIS — D631 Anemia in chronic kidney disease: Secondary | ICD-10-CM | POA: Diagnosis present

## 2020-06-11 DIAGNOSIS — I160 Hypertensive urgency: Secondary | ICD-10-CM | POA: Diagnosis present

## 2020-06-11 DIAGNOSIS — Z95828 Presence of other vascular implants and grafts: Secondary | ICD-10-CM

## 2020-06-11 DIAGNOSIS — J810 Acute pulmonary edema: Secondary | ICD-10-CM | POA: Diagnosis present

## 2020-06-11 DIAGNOSIS — S069X0S Unspecified intracranial injury without loss of consciousness, sequela: Secondary | ICD-10-CM

## 2020-06-11 DIAGNOSIS — I1311 Hypertensive heart and chronic kidney disease without heart failure, with stage 5 chronic kidney disease, or end stage renal disease: Secondary | ICD-10-CM | POA: Diagnosis present

## 2020-06-11 DIAGNOSIS — I129 Hypertensive chronic kidney disease with stage 1 through stage 4 chronic kidney disease, or unspecified chronic kidney disease: Secondary | ICD-10-CM | POA: Diagnosis present

## 2020-06-11 DIAGNOSIS — Z992 Dependence on renal dialysis: Secondary | ICD-10-CM

## 2020-06-11 DIAGNOSIS — Z9119 Patient's noncompliance with other medical treatment and regimen: Secondary | ICD-10-CM

## 2020-06-11 DIAGNOSIS — R400 Somnolence: Secondary | ICD-10-CM | POA: Diagnosis not present

## 2020-06-11 DIAGNOSIS — N186 End stage renal disease: Secondary | ICD-10-CM | POA: Diagnosis present

## 2020-06-11 DIAGNOSIS — R7401 Elevation of levels of liver transaminase levels: Secondary | ICD-10-CM | POA: Diagnosis present

## 2020-06-11 DIAGNOSIS — E8889 Other specified metabolic disorders: Secondary | ICD-10-CM | POA: Diagnosis present

## 2020-06-11 LAB — CBC WITH DIFFERENTIAL/PLATELET
Abs Immature Granulocytes: 0.03 10*3/uL (ref 0.00–0.07)
Basophils Absolute: 0 10*3/uL (ref 0.0–0.1)
Basophils Relative: 1 %
Eosinophils Absolute: 0 10*3/uL (ref 0.0–0.5)
Eosinophils Relative: 0 %
HCT: 33.4 % — ABNORMAL LOW (ref 39.0–52.0)
Hemoglobin: 10.9 g/dL — ABNORMAL LOW (ref 13.0–17.0)
Immature Granulocytes: 0 %
Lymphocytes Relative: 8 %
Lymphs Abs: 0.7 10*3/uL (ref 0.7–4.0)
MCH: 28.2 pg (ref 26.0–34.0)
MCHC: 32.6 g/dL (ref 30.0–36.0)
MCV: 86.3 fL (ref 80.0–100.0)
Monocytes Absolute: 0.6 10*3/uL (ref 0.1–1.0)
Monocytes Relative: 7 %
Neutro Abs: 7.1 10*3/uL (ref 1.7–7.7)
Neutrophils Relative %: 84 %
Platelets: 209 10*3/uL (ref 150–400)
RBC: 3.87 MIL/uL — ABNORMAL LOW (ref 4.22–5.81)
RDW: 16.3 % — ABNORMAL HIGH (ref 11.5–15.5)
WBC: 8.5 10*3/uL (ref 4.0–10.5)
nRBC: 0 % (ref 0.0–0.2)

## 2020-06-11 LAB — COMPREHENSIVE METABOLIC PANEL
ALT: 219 U/L — ABNORMAL HIGH (ref 0–44)
AST: 113 U/L — ABNORMAL HIGH (ref 15–41)
Albumin: 3.6 g/dL (ref 3.5–5.0)
Alkaline Phosphatase: 130 U/L — ABNORMAL HIGH (ref 38–126)
Anion gap: 18 — ABNORMAL HIGH (ref 5–15)
BUN: 55 mg/dL — ABNORMAL HIGH (ref 6–20)
CO2: 26 mmol/L (ref 22–32)
Calcium: 9 mg/dL (ref 8.9–10.3)
Chloride: 87 mmol/L — ABNORMAL LOW (ref 98–111)
Creatinine, Ser: 9.6 mg/dL — ABNORMAL HIGH (ref 0.61–1.24)
GFR calc Af Amer: 7 mL/min — ABNORMAL LOW (ref >60)
GFR calc non Af Amer: 6 mL/min — ABNORMAL LOW (ref >60)
Glucose, Bld: 123 mg/dL — ABNORMAL HIGH (ref 70–99)
Potassium: 5.7 mmol/L — ABNORMAL HIGH (ref 3.5–5.1)
Sodium: 131 mmol/L — ABNORMAL LOW (ref 135–145)
Total Bilirubin: 1.6 mg/dL — ABNORMAL HIGH (ref 0.3–1.2)
Total Protein: 7.4 g/dL (ref 6.5–8.1)

## 2020-06-11 LAB — BRAIN NATRIURETIC PEPTIDE: B Natriuretic Peptide: >4500 pg/mL — ABNORMAL HIGH (ref 0.0–100.0)

## 2020-06-11 LAB — TROPONIN I (HIGH SENSITIVITY)
Troponin I (High Sensitivity): 292 ng/L (ref <18)
Troponin I (High Sensitivity): 298 ng/L (ref <18)

## 2020-06-11 LAB — LIPASE, BLOOD: Lipase: 20 U/L (ref 11–51)

## 2020-06-11 IMAGING — DX DG CHEST 1V PORT
1 series · 1 of 1 positions shown · non-contrast
Comparison: [DATE]

CLINICAL DATA: Shortness of breath during dialysis

EXAM:
PORTABLE CHEST 1 VIEW

[chest ap]
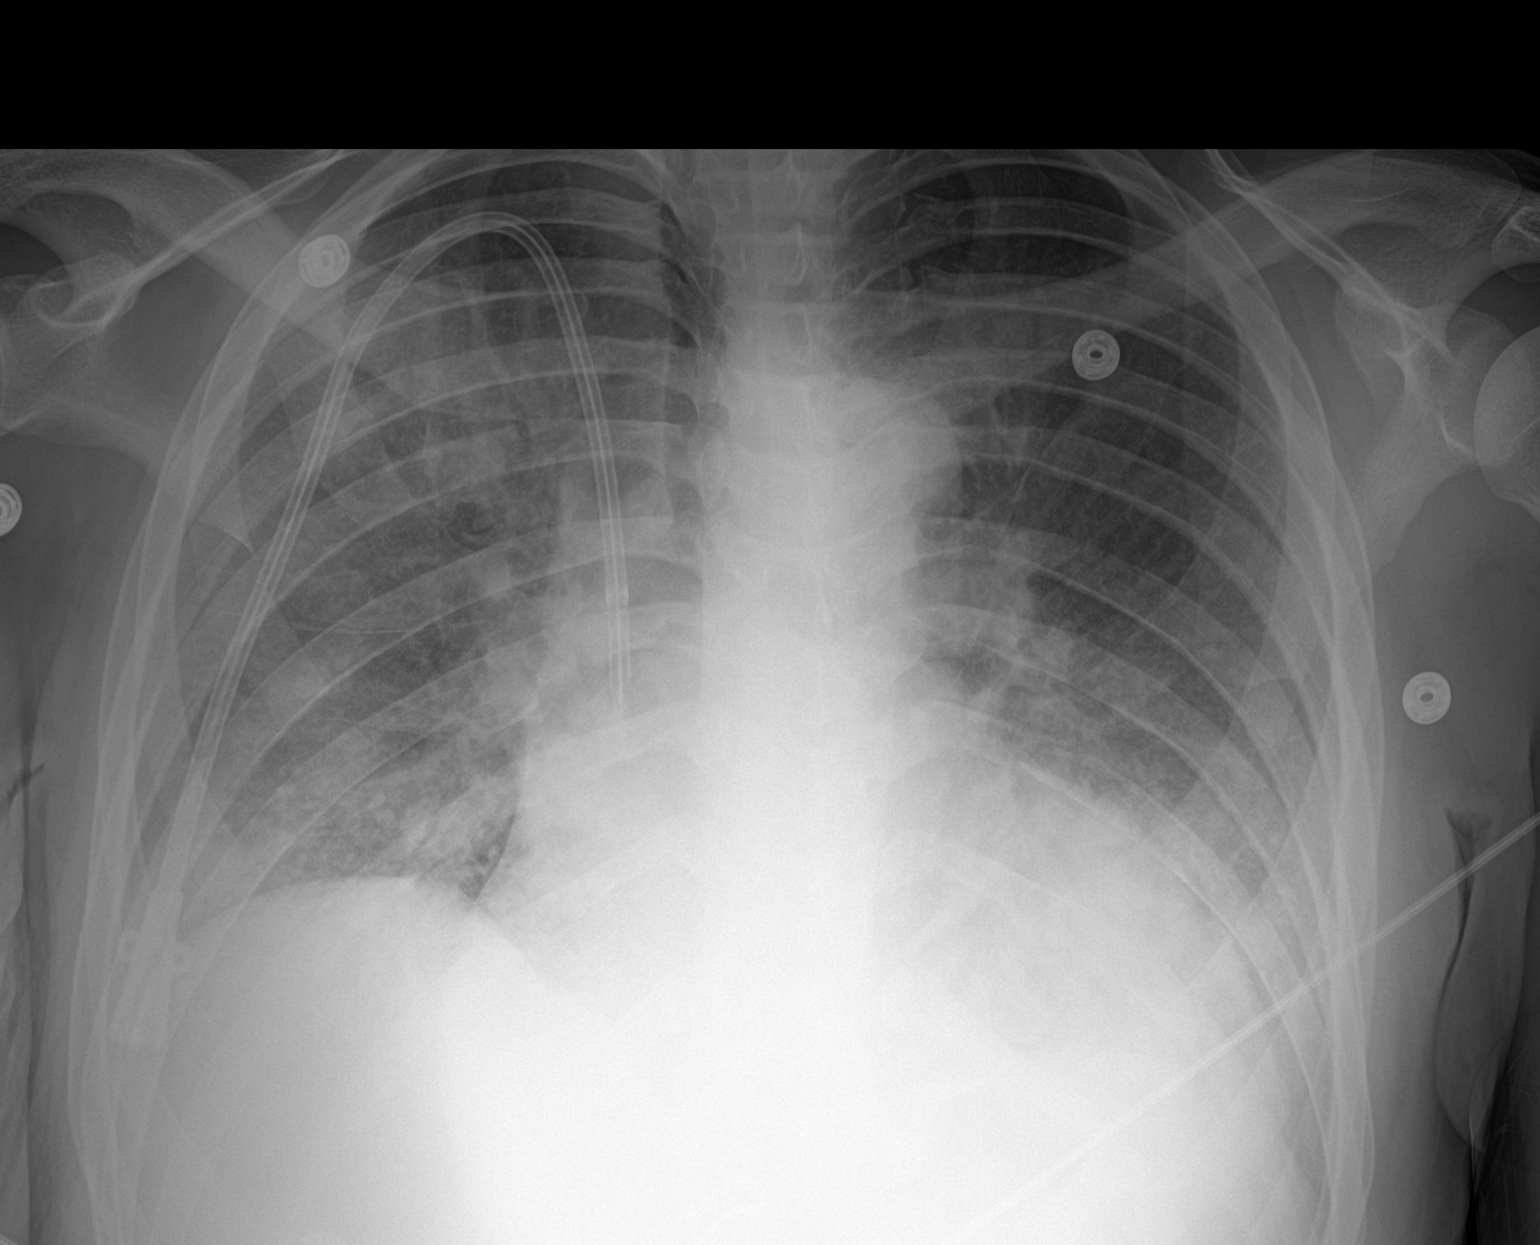

[1 of 1 positions shown; findings below may reference images not displayed]

FINDINGS: Single frontal view of the chest demonstrates stable right internal
jugular dialysis catheter. Cardiac silhouette is enlarged. Bilateral
airspace disease and effusions are again noted, without significant
change since prior study. No pneumothorax.
IMPRESSION: 1. Continued pulmonary edema.

## 2020-06-11 MED ORDER — LORAZEPAM 2 MG/ML IJ SOLN
0.5000 mg | Freq: Once | INTRAMUSCULAR | Status: AC
  Administered 2020-06-11: 0.5 mg via INTRAVENOUS
  Filled 2020-06-11: qty 1

## 2020-06-11 MED ORDER — PANTOPRAZOLE SODIUM 40 MG IV SOLR
40.0000 mg | Freq: Once | INTRAVENOUS | Status: AC
  Administered 2020-06-11: 40 mg via INTRAVENOUS
  Filled 2020-06-11: qty 40

## 2020-06-11 MED ORDER — SODIUM ZIRCONIUM CYCLOSILICATE 10 G PO PACK
10.0000 g | PACK | Freq: Once | ORAL | Status: AC
  Administered 2020-06-11: 10 g via ORAL
  Filled 2020-06-11: qty 1

## 2020-06-11 MED ORDER — AMLODIPINE BESYLATE 5 MG PO TABS
10.0000 mg | ORAL_TABLET | Freq: Once | ORAL | Status: AC
  Administered 2020-06-11: 10 mg via ORAL
  Filled 2020-06-11: qty 2

## 2020-06-11 MED ORDER — FENTANYL CITRATE (PF) 100 MCG/2ML IJ SOLN: 50.0000 ug | Freq: Once | INTRAMUSCULAR | Status: DC

## 2020-06-11 MED ORDER — PROCHLORPERAZINE EDISYLATE 10 MG/2ML IJ SOLN
10.0000 mg | Freq: Once | INTRAMUSCULAR | Status: AC
  Administered 2020-06-11: 10 mg via INTRAVENOUS
  Filled 2020-06-11: qty 2

## 2020-06-11 MED FILL — ONDANSETRON HCL 8 MG TABLET: 8 | 3 days supply | Qty: 9 | Fill #0

## 2020-06-11 NOTE — ED Triage Notes (Signed)
Pt c/o shob and abdominal pain and HTN 800 systolic. Pt sts he knows why he is short of breath but wants to know why he is vomiting and having abdominal pain. Pt had dialysis today ( T, TH, SAT). Noncompliant with antihypertensives.

## 2020-06-11 NOTE — ED Notes (Signed)
Date and time results received: 06/11/20 2138  (use smartphrase ".now" to insert current time)  Test: troponin Critical Value: 292  Name of Provider Notified: Ronnald Nian, MD   Orders Received? Or Actions Taken?: Orders Received - See Orders for details
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
## 2020-06-12 ENCOUNTER — Inpatient Hospital Stay (HOSPITAL_COMMUNITY): Payer: Medicaid Other

## 2020-06-12 ENCOUNTER — Emergency Department (HOSPITAL_COMMUNITY): Payer: Medicaid Other

## 2020-06-12 DIAGNOSIS — F0781 Postconcussional syndrome: Secondary | ICD-10-CM | POA: Diagnosis present

## 2020-06-12 DIAGNOSIS — G9341 Metabolic encephalopathy: Secondary | ICD-10-CM | POA: Diagnosis present

## 2020-06-12 DIAGNOSIS — N186 End stage renal disease: Secondary | ICD-10-CM | POA: Diagnosis present

## 2020-06-12 DIAGNOSIS — I161 Hypertensive emergency: Secondary | ICD-10-CM | POA: Diagnosis present

## 2020-06-12 DIAGNOSIS — I1311 Hypertensive heart and chronic kidney disease without heart failure, with stage 5 chronic kidney disease, or end stage renal disease: Secondary | ICD-10-CM | POA: Diagnosis present

## 2020-06-12 DIAGNOSIS — Z91138 Patient's unintentional underdosing of medication regimen for other reason: Secondary | ICD-10-CM | POA: Diagnosis not present

## 2020-06-12 DIAGNOSIS — N2581 Secondary hyperparathyroidism of renal origin: Secondary | ICD-10-CM | POA: Diagnosis present

## 2020-06-12 DIAGNOSIS — J810 Acute pulmonary edema: Secondary | ICD-10-CM | POA: Diagnosis present

## 2020-06-12 DIAGNOSIS — Z20822 Contact with and (suspected) exposure to covid-19: Secondary | ICD-10-CM | POA: Diagnosis present

## 2020-06-12 DIAGNOSIS — R400 Somnolence: Secondary | ICD-10-CM | POA: Diagnosis not present

## 2020-06-12 DIAGNOSIS — Z992 Dependence on renal dialysis: Secondary | ICD-10-CM | POA: Diagnosis not present

## 2020-06-12 DIAGNOSIS — E8779 Other fluid overload: Secondary | ICD-10-CM | POA: Diagnosis present

## 2020-06-12 DIAGNOSIS — F329 Major depressive disorder, single episode, unspecified: Secondary | ICD-10-CM | POA: Diagnosis present

## 2020-06-12 DIAGNOSIS — T43595A Adverse effect of other antipsychotics and neuroleptics, initial encounter: Secondary | ICD-10-CM | POA: Diagnosis not present

## 2020-06-12 DIAGNOSIS — J449 Chronic obstructive pulmonary disease, unspecified: Secondary | ICD-10-CM | POA: Diagnosis present

## 2020-06-12 DIAGNOSIS — J9601 Acute respiratory failure with hypoxia: Secondary | ICD-10-CM | POA: Diagnosis present

## 2020-06-12 DIAGNOSIS — R1011 Right upper quadrant pain: Secondary | ICD-10-CM | POA: Diagnosis present

## 2020-06-12 DIAGNOSIS — Z95828 Presence of other vascular implants and grafts: Secondary | ICD-10-CM | POA: Diagnosis not present

## 2020-06-12 DIAGNOSIS — D631 Anemia in chronic kidney disease: Secondary | ICD-10-CM | POA: Diagnosis present

## 2020-06-12 DIAGNOSIS — S069X0S Unspecified intracranial injury without loss of consciousness, sequela: Secondary | ICD-10-CM | POA: Diagnosis not present

## 2020-06-12 DIAGNOSIS — I248 Other forms of acute ischemic heart disease: Secondary | ICD-10-CM | POA: Diagnosis present

## 2020-06-12 DIAGNOSIS — T461X6A Underdosing of calcium-channel blockers, initial encounter: Secondary | ICD-10-CM | POA: Diagnosis present

## 2020-06-12 DIAGNOSIS — F419 Anxiety disorder, unspecified: Secondary | ICD-10-CM | POA: Diagnosis present

## 2020-06-12 DIAGNOSIS — T447X6A Underdosing of beta-adrenoreceptor antagonists, initial encounter: Secondary | ICD-10-CM | POA: Diagnosis present

## 2020-06-12 LAB — BLOOD GAS, ARTERIAL
Acid-base deficit: 2.1 mmol/L — ABNORMAL HIGH (ref 0.0–2.0)
Bicarbonate: 20.5 mmol/L (ref 20.0–28.0)
FIO2: 32
O2 Saturation: 99.7 %
Patient temperature: 98.6
pCO2 arterial: 27.5 mmHg — ABNORMAL LOW (ref 32.0–48.0)
pH, Arterial: 7.484 — ABNORMAL HIGH (ref 7.350–7.450)
pO2, Arterial: 134 mmHg — ABNORMAL HIGH (ref 83.0–108.0)

## 2020-06-12 LAB — CBC
HCT: 30.8 % — ABNORMAL LOW (ref 39.0–52.0)
Hemoglobin: 9.6 g/dL — ABNORMAL LOW (ref 13.0–17.0)
MCH: 27.7 pg (ref 26.0–34.0)
MCHC: 31.2 g/dL (ref 30.0–36.0)
MCV: 88.8 fL (ref 80.0–100.0)
Platelets: 159 10*3/uL (ref 150–400)
RBC: 3.47 MIL/uL — ABNORMAL LOW (ref 4.22–5.81)
RDW: 16.6 % — ABNORMAL HIGH (ref 11.5–15.5)
WBC: 9.6 10*3/uL (ref 4.0–10.5)
nRBC: 0 % (ref 0.0–0.2)

## 2020-06-12 LAB — CREATININE, SERUM
Creatinine, Ser: 11.21 mg/dL — ABNORMAL HIGH (ref 0.61–1.24)
GFR calc Af Amer: 6 mL/min — ABNORMAL LOW (ref >60)
GFR calc non Af Amer: 5 mL/min — ABNORMAL LOW (ref >60)

## 2020-06-12 LAB — CBG MONITORING, ED: Glucose-Capillary: 106 mg/dL — ABNORMAL HIGH (ref 70–99)

## 2020-06-12 LAB — SARS CORONAVIRUS 2 BY RT PCR (HOSPITAL ORDER, PERFORMED IN ~~LOC~~ HOSPITAL LAB): SARS Coronavirus 2: NEGATIVE

## 2020-06-12 IMAGING — CT CT HEAD W/O CM
3 of 4 series · 15 of 47 positions shown, 18 images · non-contrast
Comparison: [DATE]

CLINICAL DATA: Encephalopathy, unequal pupils

EXAM:
CT HEAD WITHOUT CONTRAST
TECHNIQUE: Contiguous axial images were obtained from the base of the skull
through the vertex without intravenous contrast.

[Series 2: head wo · axial · 0.47mm/px · z∈[-123,+7]mm · 9 of 32 slices shown, 12 images]
[im 3/32  brain]
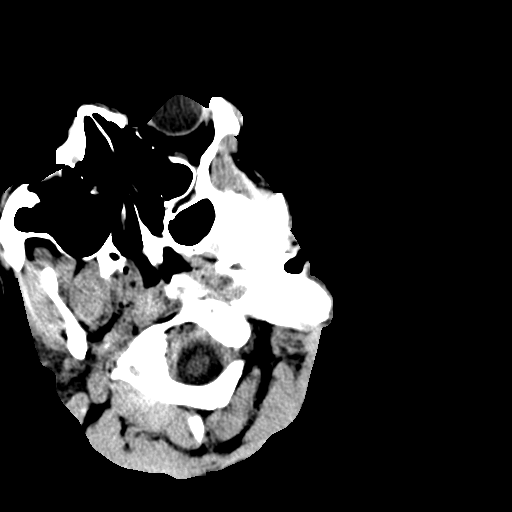
[im 3/32  bone]
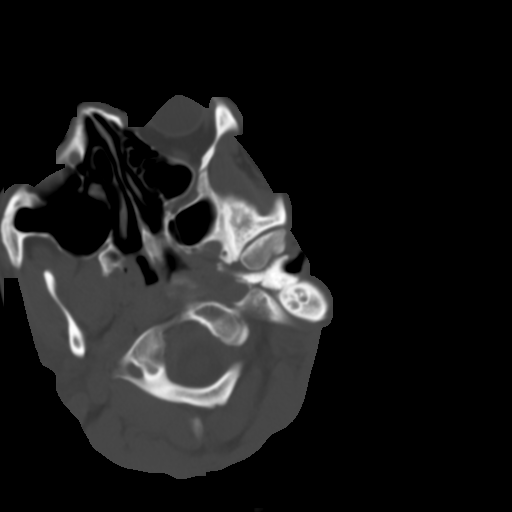
[im 7/32  brain]
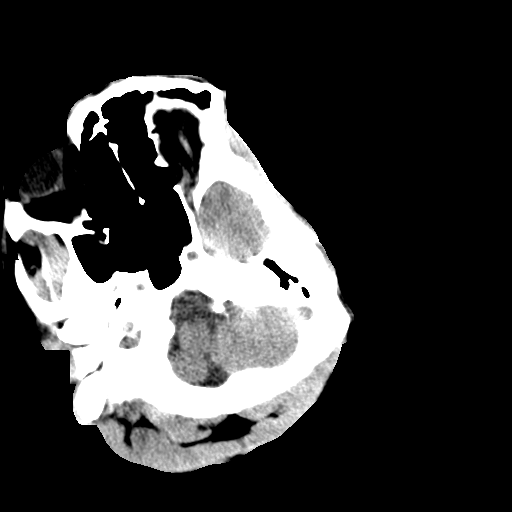
[im 9/32  brain]
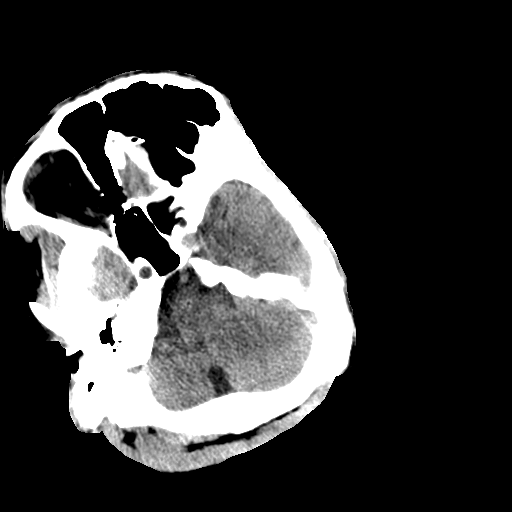
[im 14/32  brain]
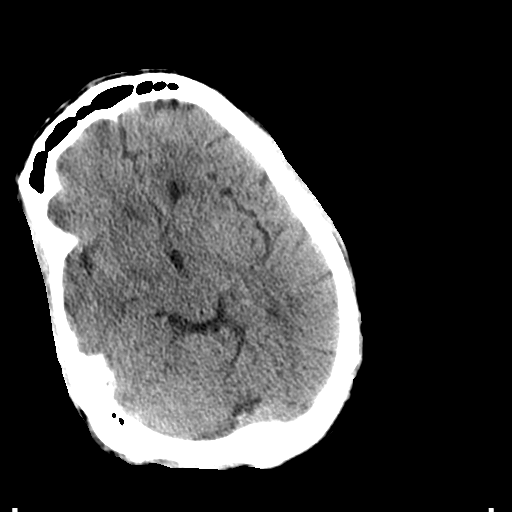
[im 16/32  brain]
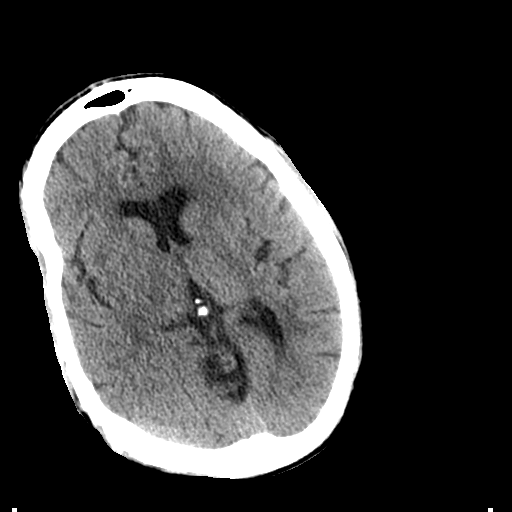
[im 16/32  bone]
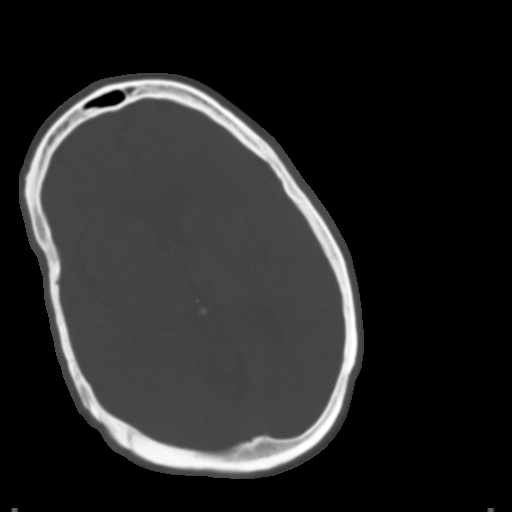
[im 18/32  brain]
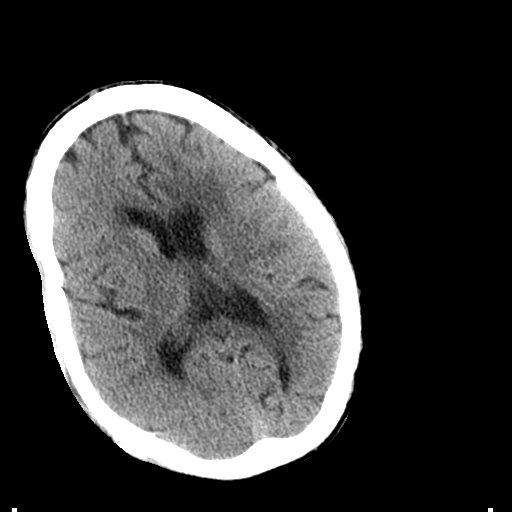
[im 23/32  brain]
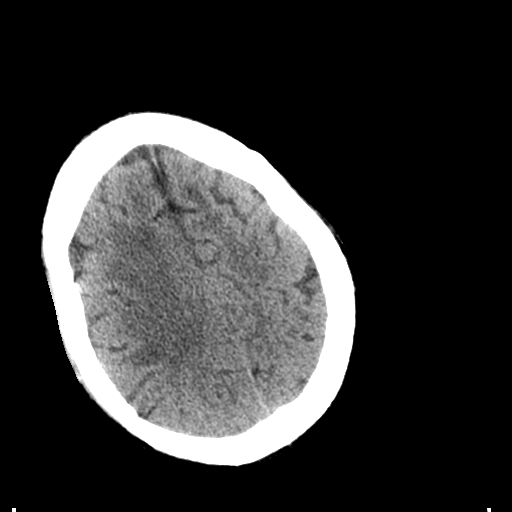
[im 25/32  brain]
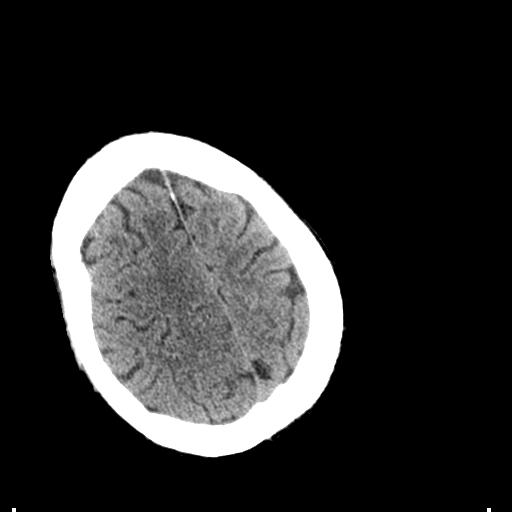
[im 29/32  brain]
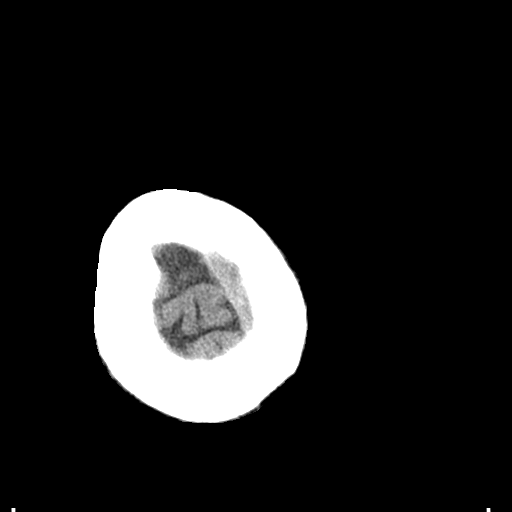
[im 29/32  bone]
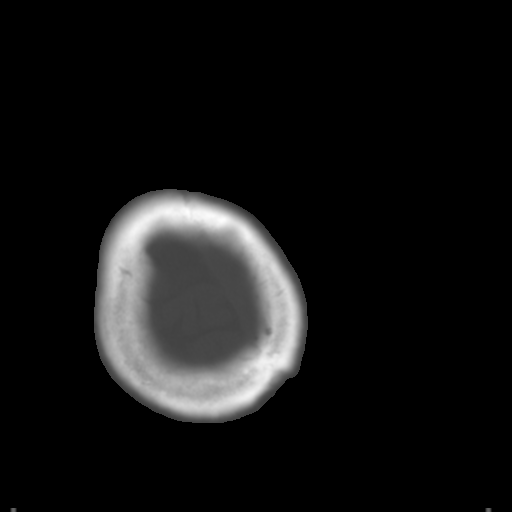

[Series 5: coronal soft tissue · coronal · 0.30mm/px · 3 of 68 slices shown]
[im 23/68  brain]
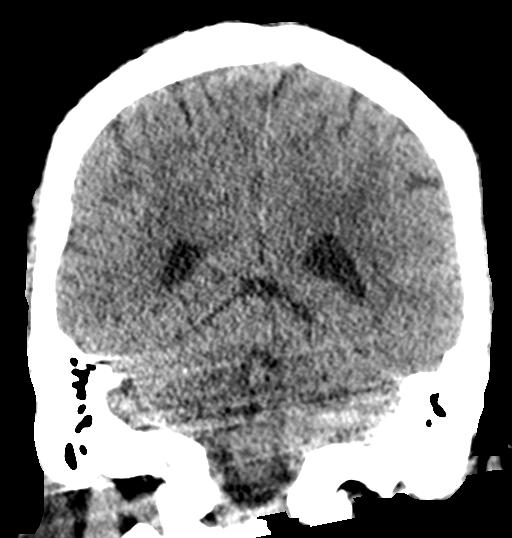
[im 30/68  brain]
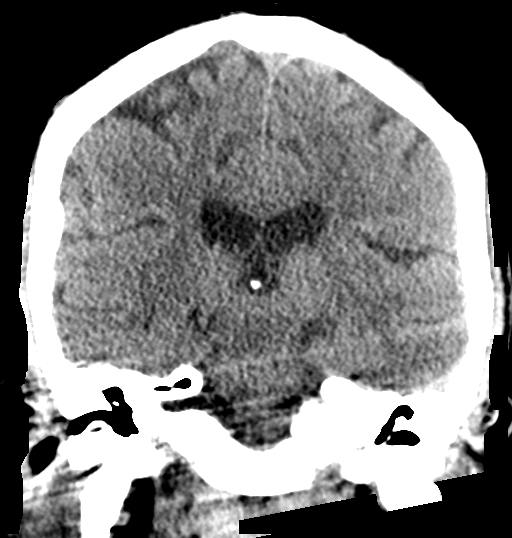
[im 38/68  brain]
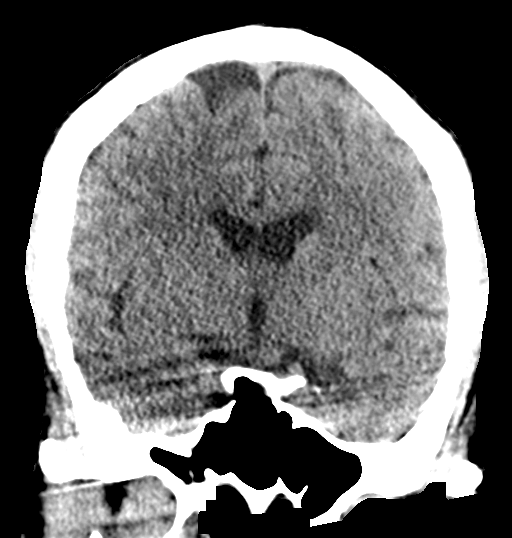

[Series 6: sagittal soft tissue · sagittal · 0.33mm/px · 3 of 51 slices shown]
[im 20/51  brain]
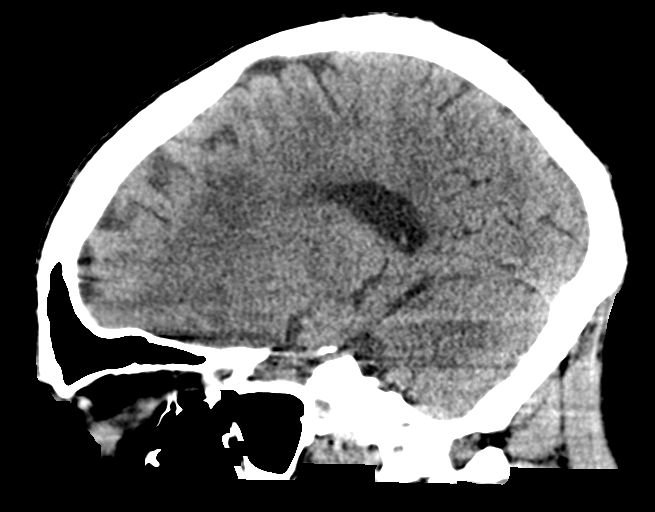
[im 26/51  brain]
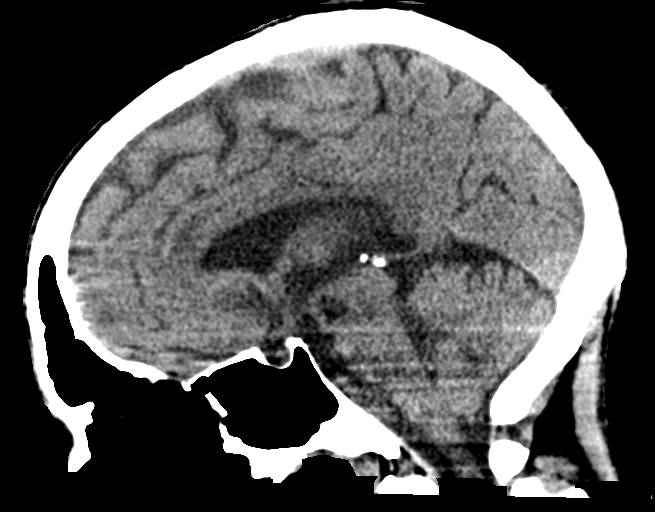
[im 32/51  brain]
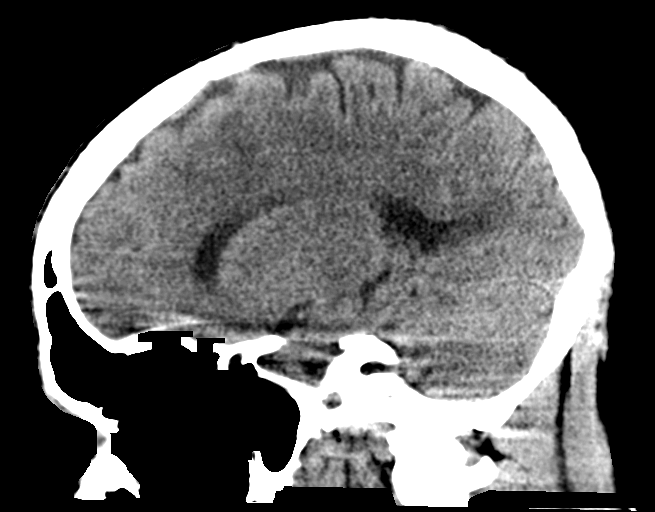

[15 of 47 positions shown; findings below may reference images not displayed]

FINDINGS: Brain: No evidence of acute territorial infarction, hemorrhage,
hydrocephalus,extra-axial collection or mass lesion/mass effect.
There is slight interval progression in the patchy areas of
periventricular white matter hypoattenuation, most notable in left
frontal lobe. Ventricles are normal in size and contour.

Vascular: No hyperdense vessel or unexpected calcification.

Skull: The skull is intact. No fracture or focal lesion identified.

Sinuses/Orbits: The visualized paranasal sinuses and mastoid air
cells are clear. The orbits and globes intact.

Other: A small amount of debris seen within the right middle year.
IMPRESSION: Interval slight worsening in the patchy white matter hypoattenuation
which could be due to accelerated/hereditary small vessel ischemia,
hypercoagulable state, vasculitis, migraines, prior infection or
demyelination. If further evaluation is required would recommend
MRI.

## 2020-06-12 IMAGING — US US ABDOMEN LIMITED
1 series · 14 of 25 positions shown · non-contrast
Comparison: None.

CLINICAL DATA: Abdominal pain, vomiting

EXAM:
ULTRASOUND ABDOMEN LIMITED RIGHT UPPER QUADRANT

[Series 1: us abdomen limited · 14 of 31 slices shown]
[im 1/31]
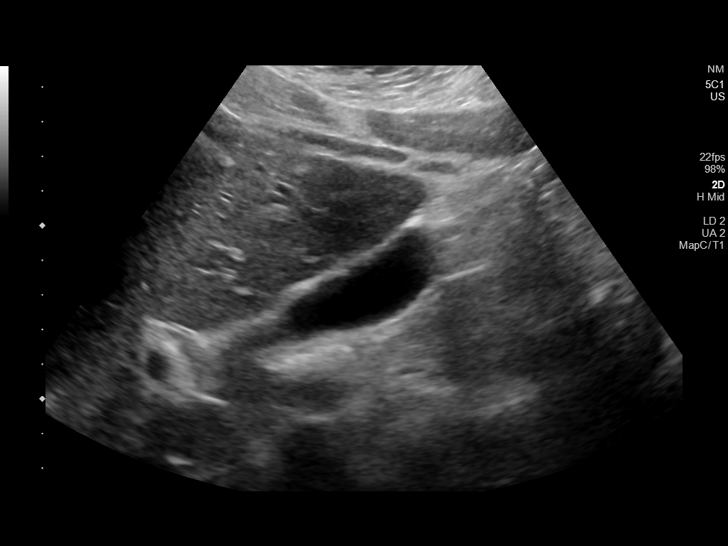
[im 3/31]
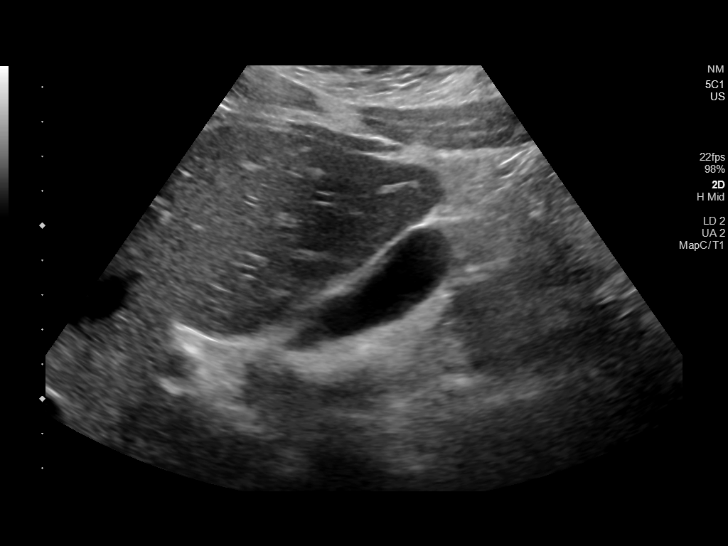
[im 6/31]
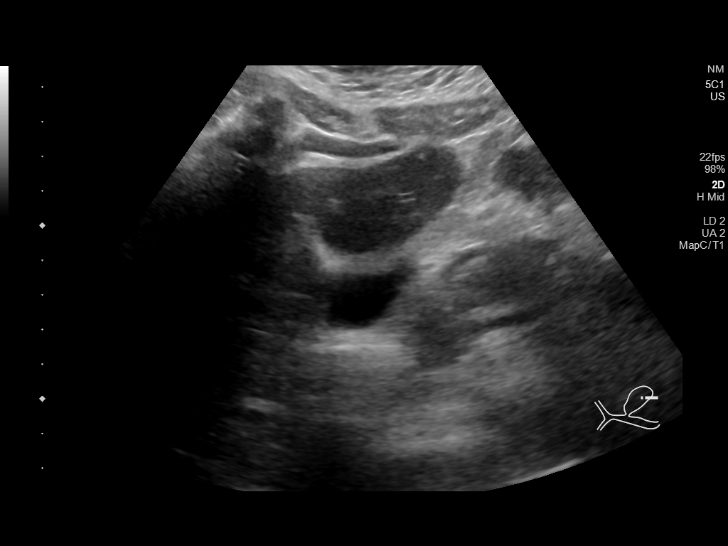
[im 8/31]
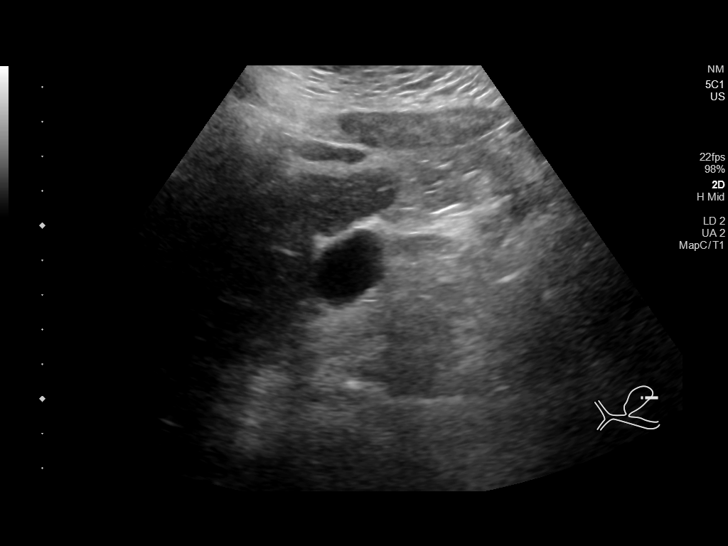
[im 11/31]
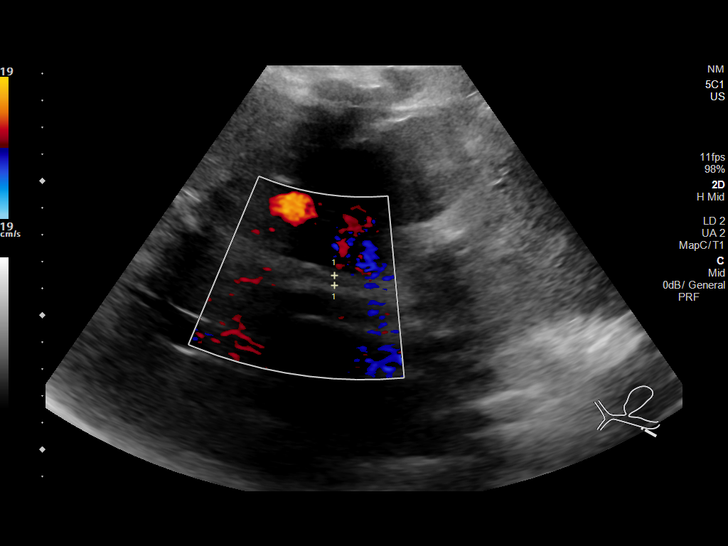
[im 12/31]
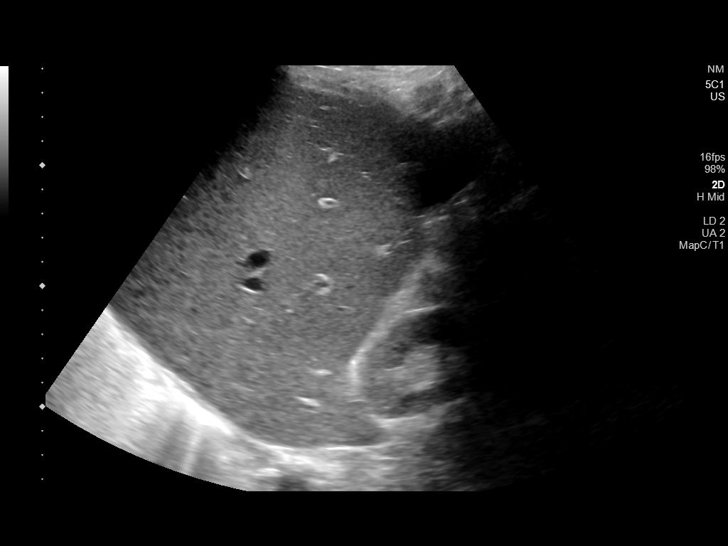
[im 14/31]
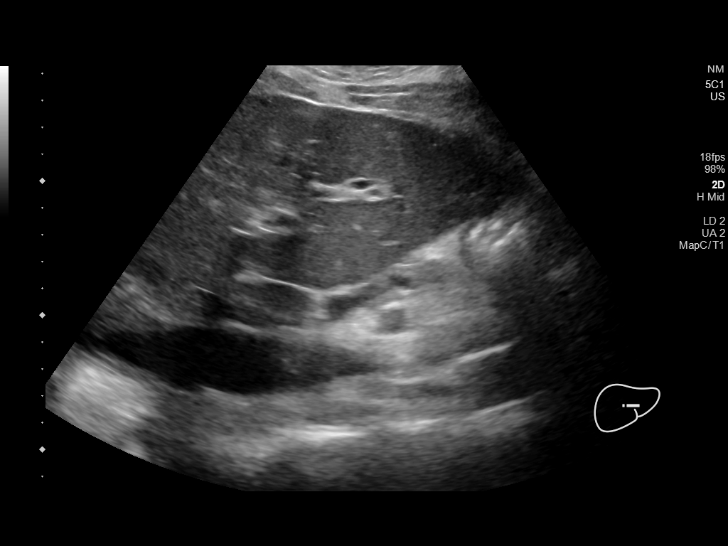
[im 17/31]
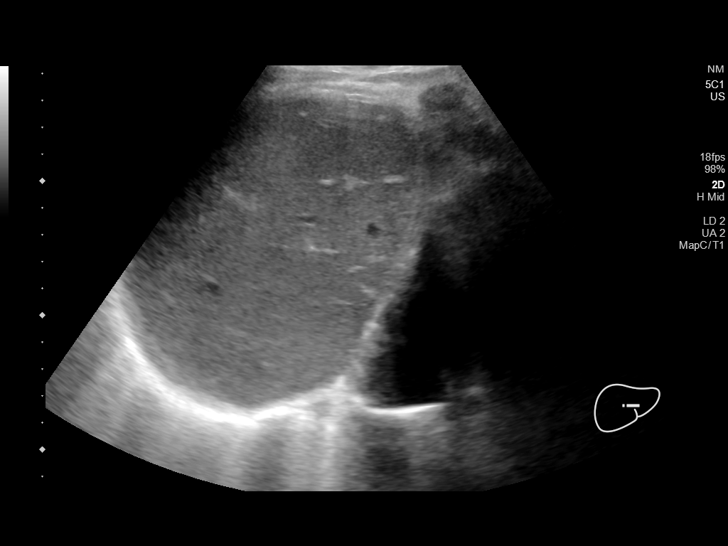
[im 19/31]
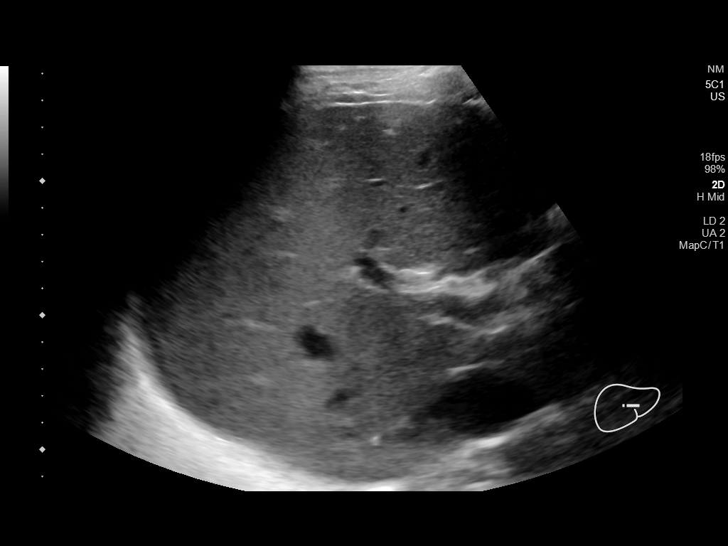
[im 21/31]
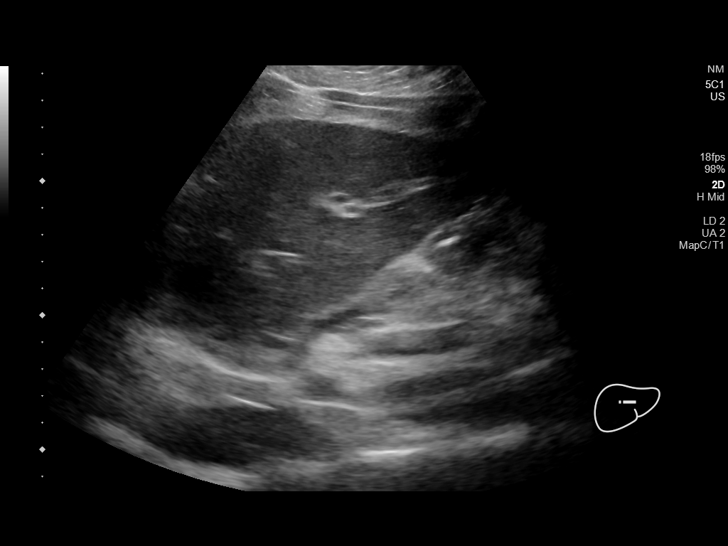
[im 23/31]
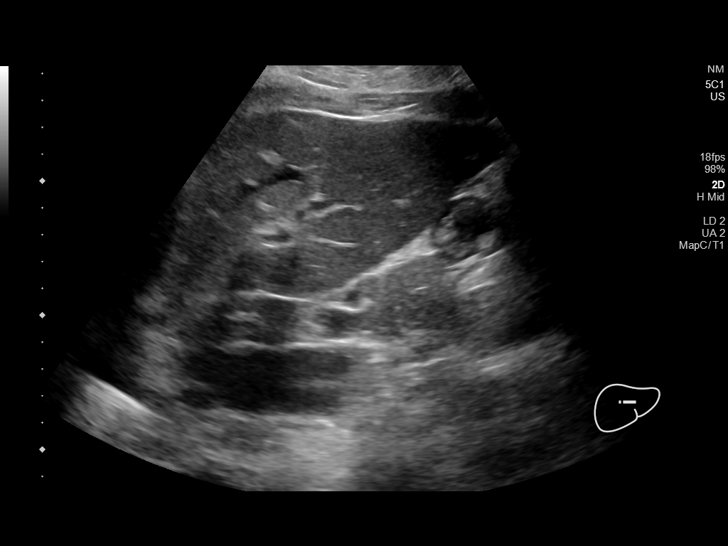
[im 26/31]
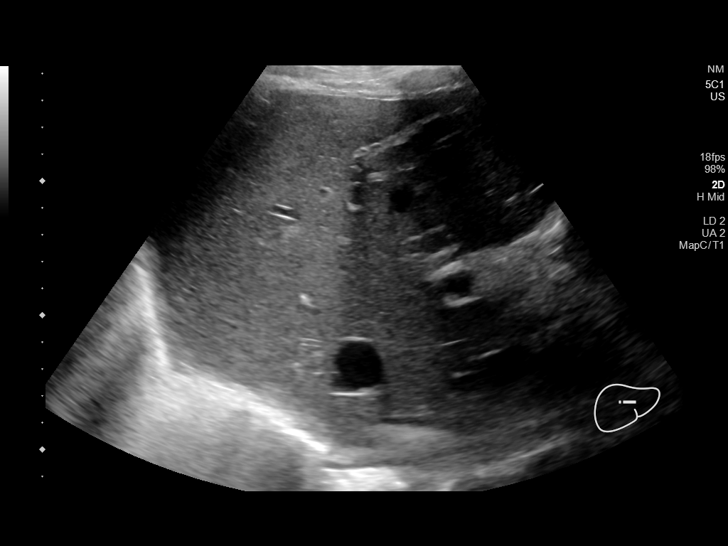
[im 28/31]
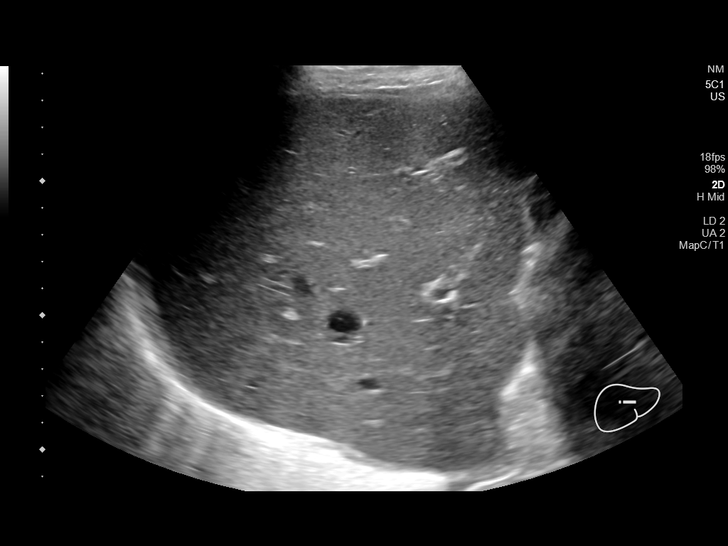
[im 31/31]
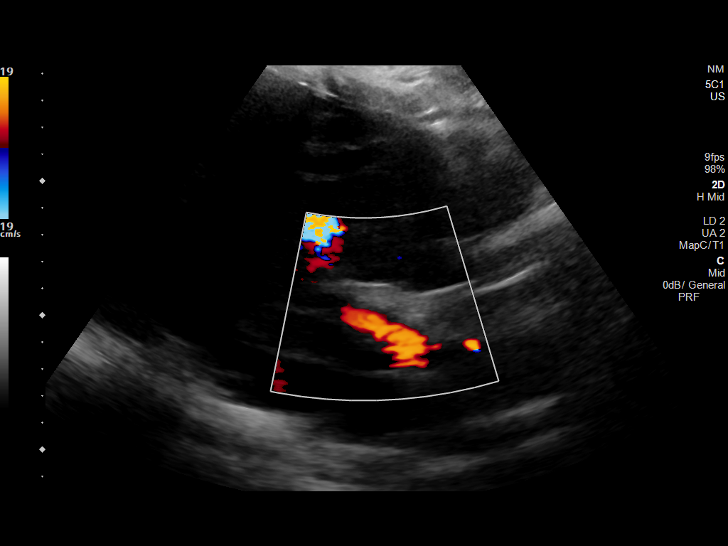

[14 of 25 positions shown; findings below may reference images not displayed]

FINDINGS: Gallbladder:

No gallstones or wall thickening visualized. No sonographic Murphy
sign noted by sonographer.

Common bile duct:

Diameter: Normal caliber, 4 mm

Liver:

No focal lesion identified. Within normal limits in parenchymal
echogenicity. Portal vein is patent on color Doppler imaging with
normal direction of blood flow towards the liver.

Other: None.
IMPRESSION: Unremarkable right upper quadrant ultrasound.

## 2020-06-12 MED ORDER — CHLORHEXIDINE GLUCONATE CLOTH 2 % EX PADS
6.0000 | MEDICATED_PAD | Freq: Every day | CUTANEOUS | Status: DC
  Administered 2020-06-12 – 2020-06-15 (×2): 6 via TOPICAL

## 2020-06-12 MED ORDER — DOCUSATE SODIUM 100 MG PO CAPS: 100.0000 mg | ORAL_CAPSULE | Freq: Two times a day (BID) | ORAL | Status: DC | PRN

## 2020-06-12 MED ORDER — LABETALOL HCL 5 MG/ML IV SOLN
10.0000 mg | Freq: Once | INTRAVENOUS | Status: AC
  Administered 2020-06-12: 10 mg via INTRAVENOUS
  Filled 2020-06-12: qty 4

## 2020-06-12 MED ORDER — SODIUM CHLORIDE 0.9 % IV SOLN: INTRAVENOUS | Status: DC | PRN

## 2020-06-12 MED ORDER — FUROSEMIDE 10 MG/ML IJ SOLN
80.0000 mg | Freq: Once | INTRAMUSCULAR | Status: AC
  Administered 2020-06-12: 80 mg via INTRAVENOUS
  Filled 2020-06-12: qty 8

## 2020-06-12 MED ORDER — LORAZEPAM 2 MG/ML IJ SOLN: 2.0000 mg | INTRAMUSCULAR | Status: DC | PRN

## 2020-06-12 MED ORDER — HEPARIN SODIUM (PORCINE) 1000 UNIT/ML IJ SOLN
3800.0000 [IU] | INTRAMUSCULAR | Status: DC
  Administered 2020-06-12 – 2020-06-15 (×3): 3800 [IU] via INTRAVENOUS
  Filled 2020-06-12: qty 3.8
  Filled 2020-06-12: qty 4

## 2020-06-12 MED ORDER — LORAZEPAM 2 MG/ML IJ SOLN
0.2500 mg | Freq: Once | INTRAMUSCULAR | Status: AC
  Administered 2020-06-12: 0.25 mg via INTRAVENOUS
  Filled 2020-06-12: qty 1

## 2020-06-12 MED ORDER — POLYETHYLENE GLYCOL 3350 17 G PO PACK: 17.0000 g | PACK | Freq: Every day | ORAL | Status: DC | PRN

## 2020-06-12 MED ORDER — LORAZEPAM 2 MG/ML IJ SOLN
1.0000 mg | INTRAMUSCULAR | Status: DC | PRN
  Administered 2020-06-12 – 2020-06-14 (×3): 1 mg via INTRAVENOUS
  Filled 2020-06-12 (×3): qty 1

## 2020-06-12 MED ORDER — PANTOPRAZOLE SODIUM 40 MG IV SOLR
40.0000 mg | Freq: Every day | INTRAVENOUS | Status: DC
  Administered 2020-06-12 – 2020-06-14 (×3): 40 mg via INTRAVENOUS
  Filled 2020-06-12 (×3): qty 40

## 2020-06-12 MED ORDER — CHLORHEXIDINE GLUCONATE CLOTH 2 % EX PADS
6.0000 | MEDICATED_PAD | Freq: Every day | CUTANEOUS | Status: DC
  Administered 2020-06-12 – 2020-06-15 (×4): 6 via TOPICAL

## 2020-06-12 MED ORDER — BLISTEX MEDICATED EX OINT
TOPICAL_OINTMENT | CUTANEOUS | Status: DC | PRN
  Filled 2020-06-12: qty 6.3

## 2020-06-12 MED ORDER — CLEVIDIPINE BUTYRATE 0.5 MG/ML IV EMUL
0.0000 mg/h | INTRAVENOUS | Status: DC
  Administered 2020-06-12: 9 mg/h via INTRAVENOUS
  Administered 2020-06-12: 5 mg/h via INTRAVENOUS
  Administered 2020-06-12: 6.5 mg/h via INTRAVENOUS
  Administered 2020-06-12: 1 mg/h via INTRAVENOUS
  Administered 2020-06-12: 6.5 mg/h via INTRAVENOUS
  Administered 2020-06-13 (×2): 11 mg/h via INTRAVENOUS
  Administered 2020-06-13: 8 mg/h via INTRAVENOUS
  Administered 2020-06-13: 11 mg/h via INTRAVENOUS
  Administered 2020-06-14: 1 mg/h via INTRAVENOUS
  Filled 2020-06-12: qty 100
  Filled 2020-06-12 (×2): qty 50
  Filled 2020-06-12: qty 100
  Filled 2020-06-12 (×4): qty 50
  Filled 2020-06-12 (×2): qty 100
  Filled 2020-06-12 (×3): qty 50

## 2020-06-12 NOTE — Procedures (Signed)
Arterial Catheter Insertion Procedure Note Douglas [name] [id number] 05/29/1986  Procedure: Insertion of Arterial Catheter  Indications: Blood pressure monitoring  Procedure Details Consent: Unable to obtain consent because of emergent medical necessity. Time Out: Verified patient identification, verified procedure, site/side was marked, verified correct patient position, special equipment/implants available, medications/allergies/relevent history reviewed, required imaging and test results available.  Performed  Maximum sterile technique was used including antiseptics, cap, gloves, gown, hand hygiene, mask and sheet. Skin prep: Chlorhexidine; local anesthetic administered 20 gauge catheter was inserted into left radial artery using the Seldinger technique. ULTRASOUND GUIDANCE USED: NO Evaluation Blood flow good; BP tracing good. Complications: No apparent complications.   Rosann Auerbach 06/12/2020
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
## 2020-06-12 NOTE — ED Notes (Signed)
Respiratory present placing arterial line.

## 2020-06-12 NOTE — Progress Notes (Signed)
Seen on arrival from [facility] Somnolent but follows commands On cleviprex drip Plan for HD today

## 2020-06-12 NOTE — H&P (Signed)
For H+P see 6/16 consult note.

## 2020-06-12 NOTE — Procedures (Signed)
   I was present at this dialysis session, have reviewed the session itself and made  appropriate changes Kelly Splinter MD [place] pager [phone]   06/12/2020, 2:08 PM
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
## 2020-06-13 DIAGNOSIS — I160 Hypertensive urgency: Secondary | ICD-10-CM

## 2020-06-13 LAB — COMPREHENSIVE METABOLIC PANEL
ALT: 144 U/L — ABNORMAL HIGH (ref 0–44)
AST: 36 U/L (ref 15–41)
Albumin: 2.7 g/dL — ABNORMAL LOW (ref 3.5–5.0)
Alkaline Phosphatase: 106 U/L (ref 38–126)
Anion gap: 14 (ref 5–15)
BUN: 41 mg/dL — ABNORMAL HIGH (ref 6–20)
CO2: 25 mmol/L (ref 22–32)
Calcium: 8.1 mg/dL — ABNORMAL LOW (ref 8.9–10.3)
Chloride: 92 mmol/L — ABNORMAL LOW (ref 98–111)
Creatinine, Ser: 8.22 mg/dL — ABNORMAL HIGH (ref 0.61–1.24)
GFR calc Af Amer: 9 mL/min — ABNORMAL LOW (ref >60)
GFR calc non Af Amer: 8 mL/min — ABNORMAL LOW (ref >60)
Glucose, Bld: 141 mg/dL — ABNORMAL HIGH (ref 70–99)
Potassium: 4.2 mmol/L (ref 3.5–5.1)
Sodium: 131 mmol/L — ABNORMAL LOW (ref 135–145)
Total Bilirubin: 0.7 mg/dL (ref 0.3–1.2)
Total Protein: 5.9 g/dL — ABNORMAL LOW (ref 6.5–8.1)

## 2020-06-13 MED ORDER — CLONIDINE HCL 0.2 MG PO TABS: 0.2000 mg | ORAL_TABLET | Freq: Three times a day (TID) | ORAL | Status: DC

## 2020-06-13 MED ORDER — CLONIDINE HCL 0.1 MG PO TABS
0.1000 mg | ORAL_TABLET | Freq: Three times a day (TID) | ORAL | Status: DC
  Administered 2020-06-13 (×3): 0.1 mg via ORAL
  Filled 2020-06-13 (×3): qty 1

## 2020-06-13 MED ORDER — CARVEDILOL 25 MG PO TABS
25.0000 mg | ORAL_TABLET | Freq: Two times a day (BID) | ORAL | Status: DC
  Administered 2020-06-13 – 2020-06-16 (×6): 25 mg via ORAL
  Filled 2020-06-13 (×6): qty 1

## 2020-06-13 MED ORDER — HYDRALAZINE HCL 50 MG PO TABS
50.0000 mg | ORAL_TABLET | Freq: Three times a day (TID) | ORAL | Status: DC
  Administered 2020-06-13 (×3): 50 mg via ORAL
  Filled 2020-06-13 (×3): qty 1

## 2020-06-13 MED ORDER — HEPARIN SODIUM (PORCINE) 1000 UNIT/ML IJ SOLN
INTRAMUSCULAR | Status: AC
  Filled 2020-06-13: qty 4

## 2020-06-13 MED ORDER — AMLODIPINE BESYLATE 10 MG PO TABS: 10.0000 mg | ORAL_TABLET | Freq: Every day | ORAL | Status: DC

## 2020-06-13 MED ORDER — HYDRALAZINE HCL 100 MG PO TABS: 100.0000 mg | ORAL_TABLET | Freq: Two times a day (BID) | ORAL | Status: DC

## 2020-06-13 NOTE — Progress Notes (Signed)
Patient c/o anxiety and restlessness due to unfamiliar bed and long day of treatment. Ativan 1mg  given IV per prn orders.

## 2020-06-13 NOTE — Procedures (Signed)
   I was present at this dialysis session, have reviewed the session itself and made  appropriate changes Kelly Splinter MD [place] pager [phone]   06/13/2020, 11:06 AM

## 2020-06-13 NOTE — Progress Notes (Signed)
[facility] Progress Note  Subjective: pt seen in ICU, still groggy, no c/o  Vitals:   06/13/20 1000 06/13/20 1015 06/13/20 1030 06/13/20 1045  BP: (!) 160/102 (!) 160/99 (!) 180/108 (!) 180/106  Pulse:      Resp:      Temp:      TempSrc:      SpO2:      Weight:      Height:        Exam:   No distress, groggy, +[place] O2  no jvd  Chest cta bilat  Cor reg no RG  Abd soft ntnd no ascites   Ext 1+ LE edema   Alert, nonfocal   RIJ TDC/ maturing RUE aVF+ bruit    Home meds:   - norvasc 10/ coreg 25 bid/ hydralazine 100 bid   - prn's/ vitamins/ supplements    OP HD: [facility] TTS   4h 32min   91.5kg   400/800  Hep none  2/2 bath  TDC/ maturing R AVF   - calc 1.25 ug   - mircera 200 q 2, last 6/5   Assessment/ Plan: 1.  HTN emergency: due to chronic volume overload and medication noncompliance.  Per mother goes to all HD sessions and stays on. Does have large fluid wt gains which must be addressed.  On Cardene gtt, BP's better. Getting clon/ hydralazine/ coreg as well by mouth.  2.  ESRD:  TTS HD.  Had extra HD yest and plan for HD today as well, max UF as tolerated, titrate down Cardene w/ vol removal. Will consider HD again tomorrow if needed as well.  3.  Encephalopathy: Not uremic, no hypercarbia. CT head with slightly worse hypoattenuation. Unclear cause, ?PRES.  4.  Anemia: Hgb 9.6. Not due for ESA yet. No active bleeding reported.  5.  Metabolic bone disease: Calcium at goal. Continue calcitriol/phos binders once tolerating PO. 6.  Nutrition:  Currently NPO     Rob Anneliese Leblond 06/13/2020, 10:53 AM   Recent Labs  Lab 06/11/20 2008 06/11/20 2008 06/12/20 0521 06/13/20 0905  K 5.7*  --   --  4.2  BUN 55*  --   --  41*  CREATININE 9.60*   < > 11.21* 8.22*  CALCIUM 9.0  --   --  8.1*  HGB 10.9*  --  9.6*  --    < > = values in this interval not displayed.   Inpatient medications: . carvedilol  25 mg Oral BID WC  . Chlorhexidine Gluconate Cloth  6 each  Topical Q0600  . Chlorhexidine Gluconate Cloth  6 each Topical Q0600  . cloNIDine  0.1 mg Oral TID  . heparin sodium (porcine)      . heparin sodium (porcine)  3,800 Units Intravenous Q T,Th,Sa-HD  . hydrALAZINE  50 mg Oral TID  . pantoprazole (PROTONIX) IV  40 mg Intravenous QHS   . sodium chloride 10 mL/hr at 06/12/20 1353  . clevidipine 8 mg/hr (06/13/20 1000)   Place/Maintain arterial line **AND** sodium chloride, docusate sodium, lip balm, LORazepam, polyethylene glycol
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
## 2020-06-14 LAB — TRIGLYCERIDES: Triglycerides: 43 mg/dL (ref <150)

## 2020-06-14 MED ORDER — HYDRALAZINE HCL 50 MG PO TABS
100.0000 mg | ORAL_TABLET | Freq: Three times a day (TID) | ORAL | Status: DC
  Administered 2020-06-14 – 2020-06-16 (×7): 100 mg via ORAL
  Filled 2020-06-14 (×7): qty 2

## 2020-06-14 MED ORDER — CHLORHEXIDINE GLUCONATE CLOTH 2 % EX PADS
6.0000 | MEDICATED_PAD | Freq: Every day | CUTANEOUS | Status: DC
  Administered 2020-06-14 – 2020-06-15 (×2): 6 via TOPICAL

## 2020-06-14 MED ORDER — CLONIDINE HCL 0.2 MG PO TABS
0.3000 mg | ORAL_TABLET | Freq: Three times a day (TID) | ORAL | Status: DC
  Administered 2020-06-14 – 2020-06-16 (×7): 0.3 mg via ORAL
  Filled 2020-06-14 (×7): qty 1

## 2020-06-14 MED ORDER — QUETIAPINE FUMARATE 25 MG PO TABS
50.0000 mg | ORAL_TABLET | Freq: Every day | ORAL | Status: DC
  Administered 2020-06-14 – 2020-06-15 (×2): 50 mg via ORAL
  Filled 2020-06-14 (×2): qty 2

## 2020-06-14 MED ORDER — QUETIAPINE FUMARATE 25 MG PO TABS
25.0000 mg | ORAL_TABLET | Freq: Every day | ORAL | Status: DC
  Administered 2020-06-15 – 2020-06-16 (×2): 25 mg via ORAL
  Filled 2020-06-14 (×2): qty 1
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
## 2020-06-14 NOTE — Progress Notes (Signed)
Discussion with mother regarding how to convince patient to be compliant as an outpatient.  We discussed that even if he does not display appropriate insight, it is hard to fix poor decision making with medications.  We will try some seroquel to see if it can help this but not sure what else we can do.  Patient stable off cleviprex, will send to floor.  Erskine Emery MD PCCM

## 2020-06-14 NOTE — Progress Notes (Signed)
[facility] Progress Note  Subjective: pt seen in ICU, up eating and fully alert  Vitals:   06/14/20 0803 06/14/20 0850 06/14/20 0900 06/14/20 1000  BP:  (!) 178/136  (!) 142/97  Pulse:   82 76  Resp:   (!) 24 (!) 28  Temp: 98.1 F (36.7 C)     TempSrc: Oral     SpO2:   97% 98%  Weight:      Height:        Exam:   Awake and alert  no jvd  Chest cta bilat  Cor reg no RG  Abd soft ntnd no ascites   Ext 1+ LE edema   Alert, nonfocal   RIJ TDC/ maturing RUE aVF+ bruit    Home meds:   - norvasc 10/ coreg 25 bid/ hydralazine 100 bid   - prn's/ vitamins/ supplements    OP HD: [facility]TS   4h 67min   91.5kg   400/800  Hep none  2/2 bath  TDC/ maturing R AVF   - calc 1.25 ug   - mircera 200 q 2, last 6/5   Assessment/ Plan: 1.  HTN emergency: due to chronic volume overload and medication noncompliance.  Per mother goes to all HD sessions and stays on. Doesn't take his BP medications sometimes if it makes him "feel bad". He does have very large fluid wt gains and never gets down to dry wt. BP's better w/ HD / vol removal, po meds and still on Cardene gtt.  2.  ESRD:  TTS HD.  Had HD x 2 here, down to 4kg over. Fluid intake is too much at home and chronic severe vol overload will have serious consequences unfortunately. Attempted to educate this to the patient who did not seem receptive.   3.  Encephalopathy: Not uremic, no hypercarbia. CT head with slightly worse hypoattenuation. Suspect some component of HTN'sive encephalopathy, which has improved and pt appears to be back to baseline MS.  C/o some visual disturbance today.  4.  Anemia: Hgb 9.6. Not due for ESA yet. No active bleeding reported.  5.  Metabolic bone disease: Calcium at goal. Continue calcitriol/phos binders once tolerating PO. 6.  Nutrition:  Currently NPO     Rob Jessalyn Hinojosa 06/14/2020, 10:45 AM   Recent Labs  Lab 06/11/20 2008 06/11/20 2008 06/12/20 0521 06/13/20 0905  K 5.7*  --   --  4.2   BUN 55*  --   --  41*  CREATININE 9.60*   < > 11.21* 8.22*  CALCIUM 9.0  --   --  8.1*  HGB 10.9*  --  9.6*  --    < > = values in this interval not displayed.   Inpatient medications:  carvedilol  25 mg Oral BID WC   Chlorhexidine Gluconate Cloth  6 each Topical Q0600   Chlorhexidine Gluconate Cloth  6 each Topical Q0600   cloNIDine  0.3 mg Oral Q8H   heparin sodium (porcine)  3,800 Units Intravenous Q T,Th,Sa-HD   hydrALAZINE  100 mg Oral Q8H   pantoprazole (PROTONIX) IV  40 mg Intravenous QHS    sodium chloride 10 mL/hr at 06/14/20 1000   clevidipine 4 mg/hr (06/14/20 0900)   Place/Maintain arterial line **AND** sodium chloride, docusate sodium, lip balm, LORazepam, polyethylene glycol

## 2020-06-14 NOTE — Progress Notes (Signed)
[facility] Progress Note Patient Name: Douglas [name] DOB: Mar 27, 1986 MRN: [id number]   Date of Service  06/14/2020  HPI/Events of Note  BP 204/129, MAP 151  eICU Interventions  Cleviprex infusion ordered resumed.        Kerry Kass Halsey Hammen 06/14/2020, 3:21 AM

## 2020-06-14 NOTE — Progress Notes (Signed)
Pt received to room, VS taken, CCMD notified.  Pt alert and appropriate.

## 2020-06-15 ENCOUNTER — Inpatient Hospital Stay (HOSPITAL_COMMUNITY): Payer: Medicaid Other

## 2020-06-15 DIAGNOSIS — N189 Chronic kidney disease, unspecified: Secondary | ICD-10-CM

## 2020-06-15 DIAGNOSIS — Z9119 Patient's noncompliance with other medical treatment and regimen: Secondary | ICD-10-CM

## 2020-06-15 DIAGNOSIS — S069X0D Unspecified intracranial injury without loss of consciousness, subsequent encounter: Secondary | ICD-10-CM

## 2020-06-15 DIAGNOSIS — F419 Anxiety disorder, unspecified: Secondary | ICD-10-CM

## 2020-06-15 LAB — CBC
HCT: 26.8 % — ABNORMAL LOW (ref 39.0–52.0)
Hemoglobin: 8.4 g/dL — ABNORMAL LOW (ref 13.0–17.0)
MCH: 27.5 pg (ref 26.0–34.0)
MCHC: 31.3 g/dL (ref 30.0–36.0)
MCV: 87.6 fL (ref 80.0–100.0)
Platelets: 179 10*3/uL (ref 150–400)
RBC: 3.06 MIL/uL — ABNORMAL LOW (ref 4.22–5.81)
RDW: 16.2 % — ABNORMAL HIGH (ref 11.5–15.5)
WBC: 4.3 10*3/uL (ref 4.0–10.5)
nRBC: 0 % (ref 0.0–0.2)

## 2020-06-15 LAB — RPR: RPR Ser Ql: NONREACTIVE

## 2020-06-15 LAB — RENAL FUNCTION PANEL
Albumin: 2.3 g/dL — ABNORMAL LOW (ref 3.5–5.0)
Anion gap: 12 (ref 5–15)
BUN: 66 mg/dL — ABNORMAL HIGH (ref 6–20)
CO2: 24 mmol/L (ref 22–32)
Calcium: 8.3 mg/dL — ABNORMAL LOW (ref 8.9–10.3)
Chloride: 93 mmol/L — ABNORMAL LOW (ref 98–111)
Creatinine, Ser: 10.6 mg/dL — ABNORMAL HIGH (ref 0.61–1.24)
GFR calc Af Amer: 7 mL/min — ABNORMAL LOW (ref >60)
GFR calc non Af Amer: 6 mL/min — ABNORMAL LOW (ref >60)
Glucose, Bld: 104 mg/dL — ABNORMAL HIGH (ref 70–99)
Phosphorus: 7.2 mg/dL — ABNORMAL HIGH (ref 2.5–4.6)
Potassium: 4.6 mmol/L (ref 3.5–5.1)
Sodium: 129 mmol/L — ABNORMAL LOW (ref 135–145)

## 2020-06-15 LAB — GLUCOSE, CAPILLARY: Glucose-Capillary: 114 mg/dL — ABNORMAL HIGH (ref 70–99)

## 2020-06-15 LAB — AMMONIA: Ammonia: 23 umol/L (ref 9–35)

## 2020-06-15 LAB — VITAMIN B12: Vitamin B-12: 1079 pg/mL — ABNORMAL HIGH (ref 180–914)

## 2020-06-15 LAB — TSH: TSH: 2.96 u[IU]/mL (ref 0.350–4.500)
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
## 2020-06-15 MED ORDER — LORAZEPAM 2 MG/ML IJ SOLN
0.5000 mg | Freq: Four times a day (QID) | INTRAMUSCULAR | Status: DC | PRN
  Administered 2020-06-16: 0.5 mg via INTRAVENOUS
  Filled 2020-06-15 (×2): qty 1

## 2020-06-15 MED ORDER — HEPARIN SODIUM (PORCINE) 1000 UNIT/ML IJ SOLN
INTRAMUSCULAR | Status: AC
  Filled 2020-06-15: qty 4

## 2020-06-15 MED ORDER — CHLORHEXIDINE GLUCONATE CLOTH 2 % EX PADS: 6.0000 | MEDICATED_PAD | Freq: Every day | CUTANEOUS | Status: DC

## 2020-06-15 MED ORDER — DARBEPOETIN ALFA 150 MCG/0.3ML IJ SOSY
150.0000 ug | PREFILLED_SYRINGE | INTRAMUSCULAR | Status: DC
  Filled 2020-06-15: qty 0.3

## 2020-06-15 MED ORDER — HEPARIN SODIUM (PORCINE) 5000 UNIT/ML IJ SOLN
5000.0000 [IU] | Freq: Three times a day (TID) | INTRAMUSCULAR | Status: DC
  Administered 2020-06-15: 5000 [IU] via SUBCUTANEOUS
  Filled 2020-06-15 (×2): qty 1

## 2020-06-15 MED ORDER — DARBEPOETIN ALFA 150 MCG/0.3ML IJ SOSY: 150.0000 ug | PREFILLED_SYRINGE | INTRAMUSCULAR | Status: DC

## 2020-06-15 NOTE — Progress Notes (Signed)
[facility] Progress Note  Subjective: pt seen on HD, no distress, no new c/o's  Vitals:   06/15/20 1430 06/15/20 1500 06/15/20 1530 06/15/20 1600  BP: 136/77 (!) 146/80 (!) 151/79 (!) 153/52  Pulse: 68 68 68 67  Resp: (!) 22 20 20  (!) 22  Temp:      TempSrc:      SpO2:      Weight:      Height:        Exam:   Awake and alert  no jvd  Chest cta bilat  Cor reg no RG  Abd soft ntnd no ascites   Ext 1+ LE edema   Alert, nonfocal   RIJ TDC/ maturing RUE aVF+ bruit    Home meds:   - norvasc 10/ coreg 25 bid/ hydralazine 100 bid   - prn's/ vitamins/ supplements    OP HD: [facility]   4h 51min   91.5kg   400/800  Hep none  2/2 bath  TDC/ maturing R AVF   - calc 1.25 ug   - mircera 200 q 2, last 6/5   Assessment/ Plan: 1.  HTN emergency/ vol overload: due to chronic volume overload and medication noncompliance.  Per mother goes to all HD sessions and stays on. Doesn't take his BP medications sometimes if it makes him "feel bad". He does have very large fluid wt gains and never gets down to dry wt. BP's better w/ HD / vol removal and is now off Cardene gtt. BP's 145/ 80 pre HD today. 3-4 kg over edw prior to HD this am.  2.  ESRD:  TTS HD.  Fluid intake is too much at home and chronic severe vol overload will have serious consequences unfortunately. Attempted to educate this to the patient.  3.  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  Pt appears back to baseline MS.  4.  Anemia: Hgb 9.6 > 8.4.  Due for ESA today, will order as darbe 150 SQ weekly. No active bleeding reported.  5.  Metabolic bone disease: Calcium at goal. Continue calcitriol/phos binders once tolerating PO. 6.  Nutrition:  Currently NPO     Rob Neeti Knudtson 06/15/2020, 4:41 PM   Recent Labs  Lab 06/11/20 2008 06/12/20 0521 06/13/20 0905 06/15/20 1358  K   < >  --  4.2 4.6  BUN   < >  --  41* 66*  CREATININE   < > 11.21* 8.22*  10.60*  CALCIUM   < >  --  8.1* 8.3*  PHOS  --   --   --  7.2*  HGB  --  9.6*  --  8.4*   < > = values in this interval not displayed.   Inpatient medications: . carvedilol  25 mg Oral BID WC  . Chlorhexidine Gluconate Cloth  6 each Topical Q0600  . Chlorhexidine Gluconate Cloth  6 each Topical Q0600  . Chlorhexidine Gluconate Cloth  6 each Topical Q0600  . cloNIDine  0.3 mg Oral Q8H  . heparin injection (subcutaneous)  5,000 Units Subcutaneous Q8H  . heparin sodium (porcine)  3,800 Units Intravenous Q T,Th,Sa-HD  . hydrALAZINE  100 mg Oral Q8H  . QUEtiapine  25 mg Oral Daily  . QUEtiapine  50 mg Oral QHS   . sodium chloride 10 mL/hr at 06/14/20 1600   Place/Maintain arterial line **AND** sodium chloride, docusate sodium, lip balm, LORazepam, polyethylene glycol

## 2020-06-15 NOTE — Progress Notes (Signed)
PT Cancellation Note  Patient Details Name: Kable Haywood MRN: [id number] DOB: [birth year]   Cancelled Treatment:    Reason Eval/Treat Not Completed: Patient at procedure or test/unavailable (HD). Will follow-up for PT treatment as schedule permits.  Mabeline Caras, PT, DPT Acute Rehabilitation Services  Pager [phone] Office [place] 06/15/2020, 2:31 PM
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
## 2020-06-16 DIAGNOSIS — R748 Abnormal levels of other serum enzymes: Secondary | ICD-10-CM

## 2020-06-16 DIAGNOSIS — F329 Major depressive disorder, single episode, unspecified: Secondary | ICD-10-CM

## 2020-06-16 DIAGNOSIS — Z992 Dependence on renal dialysis: Secondary | ICD-10-CM

## 2020-06-16 DIAGNOSIS — G9341 Metabolic encephalopathy: Secondary | ICD-10-CM

## 2020-06-16 DIAGNOSIS — R778 Other specified abnormalities of plasma proteins: Secondary | ICD-10-CM

## 2020-06-16 DIAGNOSIS — R930 Abnormal findings on diagnostic imaging of skull and head, not elsewhere classified: Secondary | ICD-10-CM

## 2020-06-16 DIAGNOSIS — Z87820 Personal history of traumatic brain injury: Secondary | ICD-10-CM

## 2020-06-16 DIAGNOSIS — R4189 Other symptoms and signs involving cognitive functions and awareness: Secondary | ICD-10-CM

## 2020-06-16 DIAGNOSIS — N186 End stage renal disease: Secondary | ICD-10-CM

## 2020-06-16 DIAGNOSIS — D631 Anemia in chronic kidney disease: Secondary | ICD-10-CM

## 2020-06-16 DIAGNOSIS — F418 Other specified anxiety disorders: Secondary | ICD-10-CM

## 2020-06-16 MED ORDER — QUETIAPINE FUMARATE 50 MG PO TABS: 50.0000 mg | ORAL_TABLET | Freq: Every day | ORAL | 0 refills | Status: DC

## 2020-06-16 MED ORDER — LORAZEPAM 0.5 MG PO TABS
0.5000 mg | ORAL_TABLET | Freq: Two times a day (BID) | ORAL | 0 refills | Status: DC | PRN
Start: 2020-06-16 — End: 2020-10-16

## 2020-06-16 MED ORDER — CLONIDINE HCL 0.3 MG PO TABS: 0.3000 mg | ORAL_TABLET | Freq: Three times a day (TID) | ORAL | 1 refills | Status: DC

## 2020-06-16 MED ORDER — HYDRALAZINE HCL 100 MG PO TABS: 100.0000 mg | ORAL_TABLET | Freq: Three times a day (TID) | ORAL | 1 refills | Status: DC

## 2020-06-16 MED ORDER — CLONIDINE HCL 0.3 MG PO TABS: 0.3000 mg | ORAL_TABLET | Freq: Three times a day (TID) | ORAL | 0 refills | Status: DC

## 2020-06-16 MED ORDER — QUETIAPINE FUMARATE 50 MG PO TABS: 50.0000 mg | ORAL_TABLET | Freq: Every day | ORAL | 1 refills | Status: DC

## 2020-06-16 MED ORDER — HYDRALAZINE HCL 100 MG PO TABS: 100.0000 mg | ORAL_TABLET | Freq: Three times a day (TID) | ORAL | 0 refills | Status: DC

## 2020-06-16 MED ORDER — LORAZEPAM 0.5 MG PO TABS
0.5000 mg | ORAL_TABLET | Freq: Two times a day (BID) | ORAL | 0 refills | Status: AC | PRN
Start: 2020-06-16 — End: 2020-06-17

## 2020-06-16 NOTE — TOC Initial Note (Addendum)
Transition of Care [facility]) - Initial/Assessment Note    Patient Details  Name: Douglas [name] MRN: [id number] Date of Birth: Nov 12, 1986  Transition of [facility] CM/SW Contact:    Bethena Roys, RN Phone Number: 06/16/2020, 1:43 PM  Clinical Narrative: Patient presented for hypertension urgency. Patient has hx of ESRD on HD TTS schedule. Prior to arrival patient was from home with the support of his mother. Patient follows up at the [facility]- patient is aware to call the office for a follow up appointment and it has been placed on the AVS. Physical therapy ambulated the patient and no home health PT needed at this time. Plan will be for transition home today. Patient states he has transportation home. Patient also states that he will be able to aafford his medications. No further needs from Case Manager.               1418 06-16-20 Patient's mother arrived and stated that they will not be able to afford medications. Patient usually uses the [facility]- closed on Sunday. Case Manager called [facility] to get total and Clonidine is $34.99, Hydralazine is $60.59 and the Seroquel is $137.99. Mother states she cannot afford the cost. MATCH was utilized in April and cannot be used again. Charge RN speaking with physician regarding dose for tonight and in the morning. Patient will ned a paper Rx for all new medications to take to [facility] on tomorrow.  Expected Discharge Plan: [place] Barriers to Discharge: No Barriers Identified   Patient Goals and CMS Choice Patient states their goals for this hospitalization and ongoing recovery are:: to return home.   Choice offered to / list presented to : NA  Expected Discharge Plan and Services Expected Discharge Plan: [place] In-house Referral: NA Discharge Planning Services: CM Consult   Living arrangements for the past 2 months: Single Family Home Expected Discharge Date: 06/16/20                  DME Agency: NA       HH Arranged: NA HH Agency: NA        Prior Living Arrangements/Services Living arrangements for the past 2 months: Single Family Home Lives with:: Relatives Patient language and need for interpreter reviewed:: Yes        Need for Family Participation in Patient Care: No (Comment) Care giver support system in place?: No (comment)   Criminal Activity/Legal Involvement Pertinent to Current Situation/Hospitalization: No - Comment as needed  Activities of Daily Living   ADL Screening (condition at time of admission) Patient's cognitive ability adequate to safely complete daily activities?: Yes Is the patient deaf or have difficulty hearing?: No Does the patient have difficulty seeing, even when wearing glasses/contacts?: No Does the patient have difficulty concentrating, remembering, or making decisions?: Yes Patient able to express need for assistance with ADLs?: Yes Does the patient have difficulty dressing or bathing?: No Independently performs ADLs?: Yes (appropriate for developmental age) Does the patient have difficulty walking or climbing stairs?: No Weakness of Legs: None Weakness of Arms/Hands: None  Permission Sought/Granted Permission sought to share information with : Case Manager, Family Supports    Emotional Assessment Appearance:: Appears stated age Attitude/Demeanor/Rapport: Engaged Affect (typically observed): Appropriate Orientation: : Oriented to Situation, Oriented to  Time, Oriented to Place, Oriented to Self Alcohol / Substance Use: Not Applicable Psych Involvement: No (comment)  Admission diagnosis:  RUQ abdominal pain [R10.11] Hypertensive urgency [I16.0] Hypertensive  emergency [I16.1] Patient Active Problem List   Diagnosis Date Noted  . Hypertensive emergency 06/12/2020  . End stage renal disease ([place]) 04/15/2020  . Peripheral vascular disease ([place]) 04/15/2020  . Secondary hyperparathyroidism of renal origin ([place])  04/15/2020  . Hyperkalemia 04/08/2020  . Thrombocytopenia ([place]) 04/08/2020  . Anemia due to chronic kidney disease 04/08/2020  . Tremor due to metabolic disorder 24/81/8590  . Acute noncardiogenic pulmonary edema ([place]) 04/08/2020  . Hypertensive renal disease, malignant, with renal failure 03/31/2020  . Hypertensive urgency    PCP:  Kerin Perna, NP Pharmacy:   [facility] [place], [place] - [address] [place] [address] Phone: [phone] Fax: [place], [place] - [address] [place] [address] Phone: [phone] Fax: [place], [address] [place] [address] Phone: [phone] Fax: [phone]  [id number] [place], [place]. AT [address] [place]. [place] [address] Phone: [phone] Fax: [phone]   Readmission Risk Interventions No flowsheet data found.
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
## 2020-06-16 NOTE — Discharge Instructions (Signed)

## 2020-06-16 NOTE — Progress Notes (Signed)
[facility] Progress Note  Subjective: pt seen in room, BP's amazingly are normal now on 3 bp meds and 2kg over dry wt  Vitals:   06/16/20 0017 06/16/20 0417 06/16/20 0846 06/16/20 1157  BP: (!) 144/89 (!) 146/98 135/81 (!) 147/98  Pulse: 75 73 69 72  Resp: 16 20 19 18   Temp: 97.7 F (36.5 C) 98.7 F (37.1 C) 98 F (36.7 C) 98.2 F (36.8 C)  TempSrc: Oral Oral Oral Oral  SpO2: 95% 96% 92% 98%  Weight:  96.7 kg  93.2 kg  Height:    6\' 5"  (1.956 m)    Exam:   Awake and alert  no jvd  Chest cta bilat  Cor reg no RG  Abd soft ntnd no ascites   Ext 1+ LE edema   Alert, nonfocal   RIJ TDC/ maturing RUE aVF+ bruit    Home meds:   - norvasc 10/ coreg 25 bid/ hydralazine 100 bid   - prn's/ vitamins/ supplements    OP HD: [facility] TTS   4h 38min   91.5kg   400/800  Hep none  2/2 bath  TDC/ maturing R AVF   - calc 1.25 ug   - mircera 200 q 2, last 6/5   Assessment/ Plan: 1.  HTN emergency/ vol overload: due to chronic volume overload and medication noncompliance.  Per mother goes to all HD sessions and stays on. Doesn't take his BP medications sometimes if it makes him "feel bad". I think that his biggest problem is chronic excessive fluid intake and have tried to tell him he should not be more than 3kg over his dry wt and I will d/w his mother as well.  Now that he is close to edw his BP's are perfectly well controlled on only 3 bp meds. Going home today, agree.  2.  ESRD:  TTS HD.  Fluid intake is too much at home and chronic severe vol overload will have serious consequences unfortunately. Attempted to educate this to the patient.  3.  Encephalopathy: Suspect some component of HTN'sive encephalopathy, which has since resolved.  Pt appears back to baseline MS.  4.  Anemia: Hgb 9.6 > 8.4.  Due for ESA today, will order as darbe 150 SQ weekly. No active bleeding reported.  5.  Metabolic bone disease: Calcium at goal. Continue calcitriol/phos binders once tolerating  PO. 6.  Nutrition:  Currently NPO     Rob Ellamarie Naeve 06/16/2020, 4:45 PM   Recent Labs  Lab 06/11/20 2008 06/12/20 0521 06/13/20 0905 06/15/20 1358  K   < >  --  4.2 4.6  BUN   < >  --  41* 66*  CREATININE   < > 11.21* 8.22* 10.60*  CALCIUM   < >  --  8.1* 8.3*  PHOS  --   --   --  7.2*  HGB  --  9.6*  --  8.4*   < > = values in this interval not displayed.   Inpatient medications: . carvedilol  25 mg Oral BID WC  . Chlorhexidine Gluconate Cloth  6 each Topical Daily  . cloNIDine  0.3 mg Oral Q8H  . [START ON 06/22/2020] darbepoetin (ARANESP) injection - NON-DIALYSIS  150 mcg Subcutaneous Q Sat-HD  . heparin injection (subcutaneous)  5,000 Units Subcutaneous Q8H  . heparin sodium (porcine)  3,800 Units Intravenous Q T,Th,Sa-HD  . hydrALAZINE  100 mg Oral Q8H  . QUEtiapine  25 mg Oral Daily  . QUEtiapine  50  mg Oral QHS   . sodium chloride 10 mL/hr at 06/14/20 1600   Place/Maintain arterial line **AND** sodium chloride, docusate sodium, lip balm, LORazepam, polyethylene glycol

## 2020-06-16 NOTE — Progress Notes (Signed)
OT Screen Note  Patient Details Name: Douglas [name] MRN: [id number] DOB: Mar 20, 1986   Cancelled Treatment:    Reason Eval/Treat Not Completed: OT screened, no needs identified, will sign off (Per PT, pt is independent with mobility and demonstrating near baseline function. Pt reporting he feels back to baseline for ADLs and is comfortable returning home and PLOF. All acute OT needs met and will sign off. Thank you.)  [place], OTR/L Acute Rehab Pager: [phone] Office: [phone] 06/16/2020, 2:19 PM

## 2020-06-16 NOTE — Evaluation (Signed)
Physical Therapy Evaluation Patient Details Name: Douglas [name] MRN: [id number] DOB: 06-01-1986 Today's Date: 06/16/2020   History of Present Illness  Pt adm with hypertensive emergency in the setting of medication noncompliance. Pt also with acute metabolic encephalopathy. PMH - ESRD on HD, TBI, depression, anxiety, HTN  Clinical Impression  Pt doing well with mobility and no further PT needed.  Ready for dc from PT standpoint.      Follow Up Recommendations No PT follow up    Equipment Recommendations  None recommended by PT    Recommendations for Other Services       Precautions / Restrictions Precautions Precautions: None      Mobility  Bed Mobility Overal bed mobility: Independent                Transfers Overall transfer level: Independent Equipment used: None                Ambulation/Gait Ambulation/Gait assistance: Modified independent (Device/Increase time) Gait Distance (Feet): 250 Feet Assistive device: None Gait Pattern/deviations: Step-through pattern   Gait velocity interpretation: >4.37 ft/sec, indicative of normal walking speed General Gait Details: Steady gait  Stairs            Wheelchair Mobility    Modified Rankin (Stroke Patients Only)       Balance Overall balance assessment: No apparent balance deficits (not formally assessed)                                           Pertinent Vitals/Pain Pain Assessment: No/denies pain    Home Living Family/patient expects to be discharged to:: Private residence Living Arrangements: Parent Available Help at Discharge: Family         Home Layout: One level Home Equipment: None      Prior Function Level of Independence: Independent         Comments: Doesn't drive     Hand Dominance        Extremity/Trunk Assessment   Upper Extremity Assessment Upper Extremity Assessment: Overall WFL for tasks assessed    Lower Extremity  Assessment Lower Extremity Assessment: Overall WFL for tasks assessed       Communication   Communication: No difficulties  Cognition Arousal/Alertness: Awake/alert Behavior During Therapy: WFL for tasks assessed/performed Overall Cognitive Status: History of cognitive impairments - at baseline                                 General Comments: History of TBI      General Comments      Exercises     Assessment/Plan    PT Assessment Patent does not need any further PT services  PT Problem List         PT Treatment Interventions      PT Goals (Current goals can be found in the Care Plan section)  Acute Rehab PT Goals PT Goal Formulation: All assessment and education complete, DC therapy    Frequency     Barriers to discharge        Co-evaluation               AM-PAC PT "6 Clicks" Mobility  Outcome Measure Help needed turning from your back to your side while in a flat bed without using bedrails?: None Help needed moving from lying on  your back to sitting on the side of a flat bed without using bedrails?: None Help needed moving to and from a bed to a chair (including a wheelchair)?: None Help needed standing up from a chair using your arms (e.g., wheelchair or bedside chair)?: None Help needed to walk in hospital room?: None Help needed climbing 3-5 steps with a railing? : None 6 Click Score: 24    End of Session   Activity Tolerance: Patient tolerated treatment well Patient left: in bed;with call bell/phone within reach (sitting EOB) Nurse Communication: Mobility status PT Visit Diagnosis: Other abnormalities of gait and mobility (R26.89)    Time: 6815-9470 PT Time Calculation (min) (ACUTE ONLY): 12 min   Charges:   PT Evaluation $PT Eval Low Complexity: 1 [place] Pager [phone] [place] 06/16/2020, 1:44 PM

## 2020-06-17 ENCOUNTER — Telehealth: Payer: Self-pay | Admitting: Physician Assistant

## 2020-06-17 ENCOUNTER — Other Ambulatory Visit: Payer: Self-pay | Admitting: Physician Assistant

## 2020-06-17 ENCOUNTER — Ambulatory Visit (INDEPENDENT_AMBULATORY_CARE_PROVIDER_SITE_OTHER): Payer: Self-pay | Admitting: Primary Care

## 2020-06-17 ENCOUNTER — Telehealth: Payer: Self-pay

## 2020-06-17 DIAGNOSIS — E440 Moderate protein-calorie malnutrition: Secondary | ICD-10-CM | POA: Insufficient documentation

## 2020-06-17 MED FILL — hydrALAZINE HCL 100 MG TABS: 100 | 30 days supply | Qty: 90 | Fill #0

## 2020-06-17 MED FILL — QUETIAPINE FUMARATE 50 MG T: 50 | 30 days supply | Qty: 30 | Fill #0

## 2020-06-17 MED FILL — CARVEDILOL 25 MG TABLET: 25 | 30 days supply | Qty: 60 | Fill #0

## 2020-06-17 MED FILL — cloNIDine HCL 0.3 MG TABS: 0.3 | 30 days supply | Qty: 90 | Fill #0

## 2020-06-17 NOTE — Telephone Encounter (Signed)
Transition Care Management Follow-up Telephone Call  Call completed with patient's mother, Eustaquio Maize.  The patient gave this CM verbal authorization to speak to his mother about his medical condition/ needs   Date of discharge and from where: 06/16/2020, [facility]   How have you been since you were released from the hospital? His mother said that he has been on " a good path" and she wants him to stay on that path. She also noted that he had a good experience during his recent hospitalization.   She was planning to take the patient to see Ms Oletta Lamas, NP for follow up appt today and  was hoping to clarify medication orders.  This CM explained that the appt was being re-scheduled as he was just discharged from the hospital .  This CM to inform PCP of medication questions.   Regarding medications - she was not able to fill the lorazepam because [place] does not carry it. She is requesting that a prescription be sent to [facility].  She also explained that he does not have any refills for the carvedilol.  It is listed on his AVS to continue with it. She gave him the last pill she had at 1200 today and she wants to make sure that he gets this medication for his BP.  She would like this prescription sent to [facility]  She is requesting a handicap placard for the car as she provides his transportation.     Any questions or concerns? -noted above  Items Reviewed:  Did the pt receive and understand the discharge instructions provided? Yes, his mother has them.   Medications obtained and verified?  Medication questions noted above.  She said that they have all of the other medications and she oversees his medication regime.   Any new allergies since your discharge?  none reported   Do you have support at home?  yes, his mother  Other (ie: DME, Home Health, etc) no home health or DME ordered.  He attends HD T/T/S at [place].  His mother provides transportation and  sits with him while he is at HD.    Functional Questionnaire: (I = Independent and D = Dependent) ADL's:  Mother provides all needed assistance.  He has a wheelchair to use when needed.    Follow up appointments reviewed:    PCP Hospital f/u appt confirmed?  Appointment scheduled at [facility] 06/24/2020 @ 1050.  Specialist Hospital f/u appt confirmed?none scheduled at this time.   Are transportation arrangements needed? no  If their condition worsens, is the pt aware to call  their PCP or go to the ED? yes  Was the patient provided with contact information for the PCP's office or ED?  his mother has the phone number for the clinic  Was the pt encouraged to call back with questions or concerns?  yes

## 2020-06-17 NOTE — Telephone Encounter (Signed)
Transition of care contact from inpatient facility  Date of discharge: 06/16/20 Date of contact: 06/17/20 Method: Phone Spoke to: Patient's mother  Patient contacted to discuss transition of care from recent inpatient hospitalization. Patient was admitted to [facility] from 06/11/20-06/16/20 with discharge diagnosis of hypertensive emergency, ESRD and metabolic encephalopathy.   Medication changes were reviewed. Patient's mother reports he has been doing very well, taking his BP medications and limiting his fluid intake. She did request a refill of carvedilol which I sent through [facility]. No other concerns or needs at this time.  Patient will follow up with his/her outpatient HD unit on: 06/18/20  Anice Paganini, PA-C 06/17/2020, 2:37 PM  [facility] Pager: [phone]

## 2020-06-24 ENCOUNTER — Other Ambulatory Visit: Payer: Self-pay

## 2020-06-24 ENCOUNTER — Other Ambulatory Visit (INDEPENDENT_AMBULATORY_CARE_PROVIDER_SITE_OTHER): Payer: Self-pay | Admitting: Primary Care

## 2020-06-24 ENCOUNTER — Ambulatory Visit (INDEPENDENT_AMBULATORY_CARE_PROVIDER_SITE_OTHER): Payer: Self-pay | Admitting: Primary Care

## 2020-06-24 VITALS — BP 144/89 | HR 68 | Temp 96.4°F | Resp 16 | Wt 211.0 lb

## 2020-06-24 DIAGNOSIS — R112 Nausea with vomiting, unspecified: Secondary | ICD-10-CM

## 2020-06-24 MED ORDER — QUETIAPINE FUMARATE 25 MG PO TABS
25.0000 mg | ORAL_TABLET | Freq: Every day | ORAL | 1 refills | Status: DC
Start: 2020-06-24 — End: 2020-06-24

## 2020-06-24 MED ORDER — QUETIAPINE FUMARATE 50 MG PO TABS: 50.0000 mg | ORAL_TABLET | Freq: Every day | ORAL | 1 refills | Status: DC

## 2020-06-24 MED ORDER — ONDANSETRON HCL 8 MG PO TABS: 8.0000 mg | ORAL_TABLET | Freq: Three times a day (TID) | ORAL | 0 refills | Status: DC | PRN

## 2020-06-24 MED FILL — ONDANSETRON HCL 8 MG TABLET: 8 | 6 days supply | Qty: 20 | Fill #0
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
## 2020-06-24 NOTE — Progress Notes (Signed)
Would like the ativan rx sent to a different pharmacy because [facility] was unable to refill.   Patient arrived very drowsy and lethargic due to medication Dialysis days are T, Th, Sat

## 2020-06-25 ENCOUNTER — Inpatient Hospital Stay (INDEPENDENT_AMBULATORY_CARE_PROVIDER_SITE_OTHER): Payer: Self-pay | Admitting: Primary Care

## 2020-06-25 LAB — BASIC METABOLIC PANEL
BUN/Creatinine Ratio: 6 — ABNORMAL LOW (ref 9–20)
BUN: 65 mg/dL — ABNORMAL HIGH (ref 6–20)
CO2: 24 mmol/L (ref 20–29)
Calcium: 9.5 mg/dL (ref 8.7–10.2)
Chloride: 91 mmol/L — ABNORMAL LOW (ref 96–106)
Creatinine, Ser: 11.12 mg/dL — ABNORMAL HIGH (ref 0.76–1.27)
GFR calc Af Amer: 6 mL/min/{1.73_m2} — ABNORMAL LOW (ref >59)
GFR calc non Af Amer: 5 mL/min/{1.73_m2} — ABNORMAL LOW (ref >59)
Glucose: 127 mg/dL — ABNORMAL HIGH (ref 65–99)
Potassium: 6.9 mmol/L (ref 3.5–5.2)
Sodium: 135 mmol/L (ref 134–144)

## 2020-06-25 LAB — CBC WITH DIFFERENTIAL/PLATELET
Basophils Absolute: 0.1 10*3/uL (ref 0.0–0.2)
Basos: 1 %
EOS (ABSOLUTE): 0.4 10*3/uL (ref 0.0–0.4)
Eos: 6 %
Hematocrit: 31.2 % — ABNORMAL LOW (ref 37.5–51.0)
Hemoglobin: 9.7 g/dL — ABNORMAL LOW (ref 13.0–17.7)
Immature Grans (Abs): 0.1 10*3/uL (ref 0.0–0.1)
Immature Granulocytes: 1 %
Lymphocytes Absolute: 0.9 10*3/uL (ref 0.7–3.1)
Lymphs: 13 %
MCH: 27.6 pg (ref 26.6–33.0)
MCHC: 31.1 g/dL — ABNORMAL LOW (ref 31.5–35.7)
MCV: 89 fL (ref 79–97)
Monocytes Absolute: 0.8 10*3/uL (ref 0.1–0.9)
Monocytes: 12 %
Neutrophils Absolute: 4.6 10*3/uL (ref 1.4–7.0)
Neutrophils: 67 %
Platelets: 284 10*3/uL (ref 150–450)
RBC: 3.51 x10E6/uL — ABNORMAL LOW (ref 4.14–5.80)
RDW: 15.4 % (ref 11.6–15.4)
WBC: 6.8 10*3/uL (ref 3.4–10.8)

## 2020-06-25 LAB — VITAMIN B12: Vitamin B-12: 863 pg/mL (ref 232–1245)

## 2020-06-27 ENCOUNTER — Telehealth (INDEPENDENT_AMBULATORY_CARE_PROVIDER_SITE_OTHER): Payer: Self-pay | Admitting: Primary Care

## 2020-06-27 NOTE — Telephone Encounter (Signed)
Douglas [name] is calling from [facility]. 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 Please advise CB- [phone]

## 2020-07-02 ENCOUNTER — Other Ambulatory Visit: Payer: Self-pay

## 2020-07-02 ENCOUNTER — Emergency Department (HOSPITAL_COMMUNITY): Payer: Medicare Other

## 2020-07-02 ENCOUNTER — Encounter (HOSPITAL_COMMUNITY): Payer: Self-pay | Admitting: Emergency Medicine

## 2020-07-02 ENCOUNTER — Observation Stay (HOSPITAL_COMMUNITY)
Admission: EM | Admit: 2020-07-02 | Discharge: 2020-07-02 | Disposition: A | Discharge status: Home / Self Care | Payer: Medicare Other | Attending: Family Medicine | Admitting: Family Medicine

## 2020-07-02 DIAGNOSIS — E875 Hyperkalemia: Secondary | ICD-10-CM | POA: Diagnosis present

## 2020-07-02 DIAGNOSIS — R202 Paresthesia of skin: Secondary | ICD-10-CM

## 2020-07-02 DIAGNOSIS — R748 Abnormal levels of other serum enzymes: Secondary | ICD-10-CM | POA: Diagnosis not present

## 2020-07-02 DIAGNOSIS — I120 Hypertensive chronic kidney disease with stage 5 chronic kidney disease or end stage renal disease: Secondary | ICD-10-CM | POA: Insufficient documentation

## 2020-07-02 DIAGNOSIS — I160 Hypertensive urgency: Secondary | ICD-10-CM | POA: Diagnosis present

## 2020-07-02 DIAGNOSIS — R2689 Other abnormalities of gait and mobility: Secondary | ICD-10-CM | POA: Diagnosis not present

## 2020-07-02 DIAGNOSIS — R778 Other specified abnormalities of plasma proteins: Secondary | ICD-10-CM | POA: Diagnosis present

## 2020-07-02 DIAGNOSIS — Y92009 Unspecified place in unspecified non-institutional (private) residence as the place of occurrence of the external cause: Secondary | ICD-10-CM

## 2020-07-02 DIAGNOSIS — G9341 Metabolic encephalopathy: Secondary | ICD-10-CM | POA: Diagnosis not present

## 2020-07-02 DIAGNOSIS — W19XXXA Unspecified fall, initial encounter: Secondary | ICD-10-CM

## 2020-07-02 DIAGNOSIS — N186 End stage renal disease: Secondary | ICD-10-CM

## 2020-07-02 DIAGNOSIS — Z20822 Contact with and (suspected) exposure to covid-19: Secondary | ICD-10-CM | POA: Diagnosis not present

## 2020-07-02 DIAGNOSIS — Z992 Dependence on renal dialysis: Secondary | ICD-10-CM | POA: Diagnosis not present

## 2020-07-02 DIAGNOSIS — R7989 Other specified abnormal findings of blood chemistry: Secondary | ICD-10-CM | POA: Diagnosis present

## 2020-07-02 DIAGNOSIS — Z79899 Other long term (current) drug therapy: Secondary | ICD-10-CM | POA: Diagnosis not present

## 2020-07-02 DIAGNOSIS — F17210 Nicotine dependence, cigarettes, uncomplicated: Secondary | ICD-10-CM | POA: Insufficient documentation

## 2020-07-02 DIAGNOSIS — N19 Unspecified kidney failure: Secondary | ICD-10-CM

## 2020-07-02 LAB — BRAIN NATRIURETIC PEPTIDE: B Natriuretic Peptide: 2020.1 pg/mL — ABNORMAL HIGH (ref 0.0–100.0)

## 2020-07-02 LAB — RENAL FUNCTION PANEL
Albumin: 3.3 g/dL — ABNORMAL LOW (ref 3.5–5.0)
Anion gap: 13 (ref 5–15)
BUN: 36 mg/dL — ABNORMAL HIGH (ref 6–20)
CO2: 27 mmol/L (ref 22–32)
Calcium: 8.3 mg/dL — ABNORMAL LOW (ref 8.9–10.3)
Chloride: 94 mmol/L — ABNORMAL LOW (ref 98–111)
Creatinine, Ser: 7.47 mg/dL — ABNORMAL HIGH (ref 0.61–1.24)
GFR calc Af Amer: 10 mL/min — ABNORMAL LOW (ref >60)
GFR calc non Af Amer: 9 mL/min — ABNORMAL LOW (ref >60)
Glucose, Bld: 123 mg/dL — ABNORMAL HIGH (ref 70–99)
Phosphorus: 5.4 mg/dL — ABNORMAL HIGH (ref 2.5–4.6)
Potassium: 4.7 mmol/L (ref 3.5–5.1)
Sodium: 134 mmol/L — ABNORMAL LOW (ref 135–145)

## 2020-07-02 LAB — CBC
HCT: 35 % — ABNORMAL LOW (ref 39.0–52.0)
Hemoglobin: 10.3 g/dL — ABNORMAL LOW (ref 13.0–17.0)
MCH: 27.2 pg (ref 26.0–34.0)
MCHC: 29.4 g/dL — ABNORMAL LOW (ref 30.0–36.0)
MCV: 92.3 fL (ref 80.0–100.0)
Platelets: 140 10*3/uL — ABNORMAL LOW (ref 150–400)
RBC: 3.79 MIL/uL — ABNORMAL LOW (ref 4.22–5.81)
RDW: 18.1 % — ABNORMAL HIGH (ref 11.5–15.5)
WBC: 5.6 10*3/uL (ref 4.0–10.5)
nRBC: 0 % (ref 0.0–0.2)

## 2020-07-02 LAB — TROPONIN I (HIGH SENSITIVITY)
Troponin I (High Sensitivity): 43 ng/L — ABNORMAL HIGH (ref <18)
Troponin I (High Sensitivity): 53 ng/L — ABNORMAL HIGH (ref <18)

## 2020-07-02 LAB — PROTIME-INR
INR: 1.1 (ref 0.8–1.2)
Prothrombin Time: 14 seconds (ref 11.4–15.2)

## 2020-07-02 LAB — HEPATITIS B SURFACE ANTIGEN: Hepatitis B Surface Ag: NONREACTIVE

## 2020-07-02 LAB — BASIC METABOLIC PANEL
Anion gap: 19 — ABNORMAL HIGH (ref 5–15)
BUN: 100 mg/dL — ABNORMAL HIGH (ref 6–20)
CO2: 20 mmol/L — ABNORMAL LOW (ref 22–32)
Calcium: 8.8 mg/dL — ABNORMAL LOW (ref 8.9–10.3)
Chloride: 93 mmol/L — ABNORMAL LOW (ref 98–111)
Creatinine, Ser: 14.55 mg/dL — ABNORMAL HIGH (ref 0.61–1.24)
GFR calc Af Amer: 4 mL/min — ABNORMAL LOW (ref >60)
GFR calc non Af Amer: 4 mL/min — ABNORMAL LOW (ref >60)
Glucose, Bld: 95 mg/dL (ref 70–99)
Potassium: >7.5 mmol/L (ref 3.5–5.1)
Sodium: 132 mmol/L — ABNORMAL LOW (ref 135–145)

## 2020-07-02 LAB — CBG MONITORING, ED
Glucose-Capillary: 102 mg/dL — ABNORMAL HIGH (ref 70–99)
Glucose-Capillary: 80 mg/dL (ref 70–99)

## 2020-07-02 LAB — SARS CORONAVIRUS 2 BY RT PCR (HOSPITAL ORDER, PERFORMED IN ~~LOC~~ HOSPITAL LAB): SARS Coronavirus 2: NEGATIVE

## 2020-07-02 IMAGING — DX DG CHEST 1V PORT
1 series · 1 of 1 positions shown · non-contrast
Comparison: Radiograph [DATE]

CLINICAL DATA: Weakness

EXAM:
PORTABLE CHEST 1 VIEW

[chest]
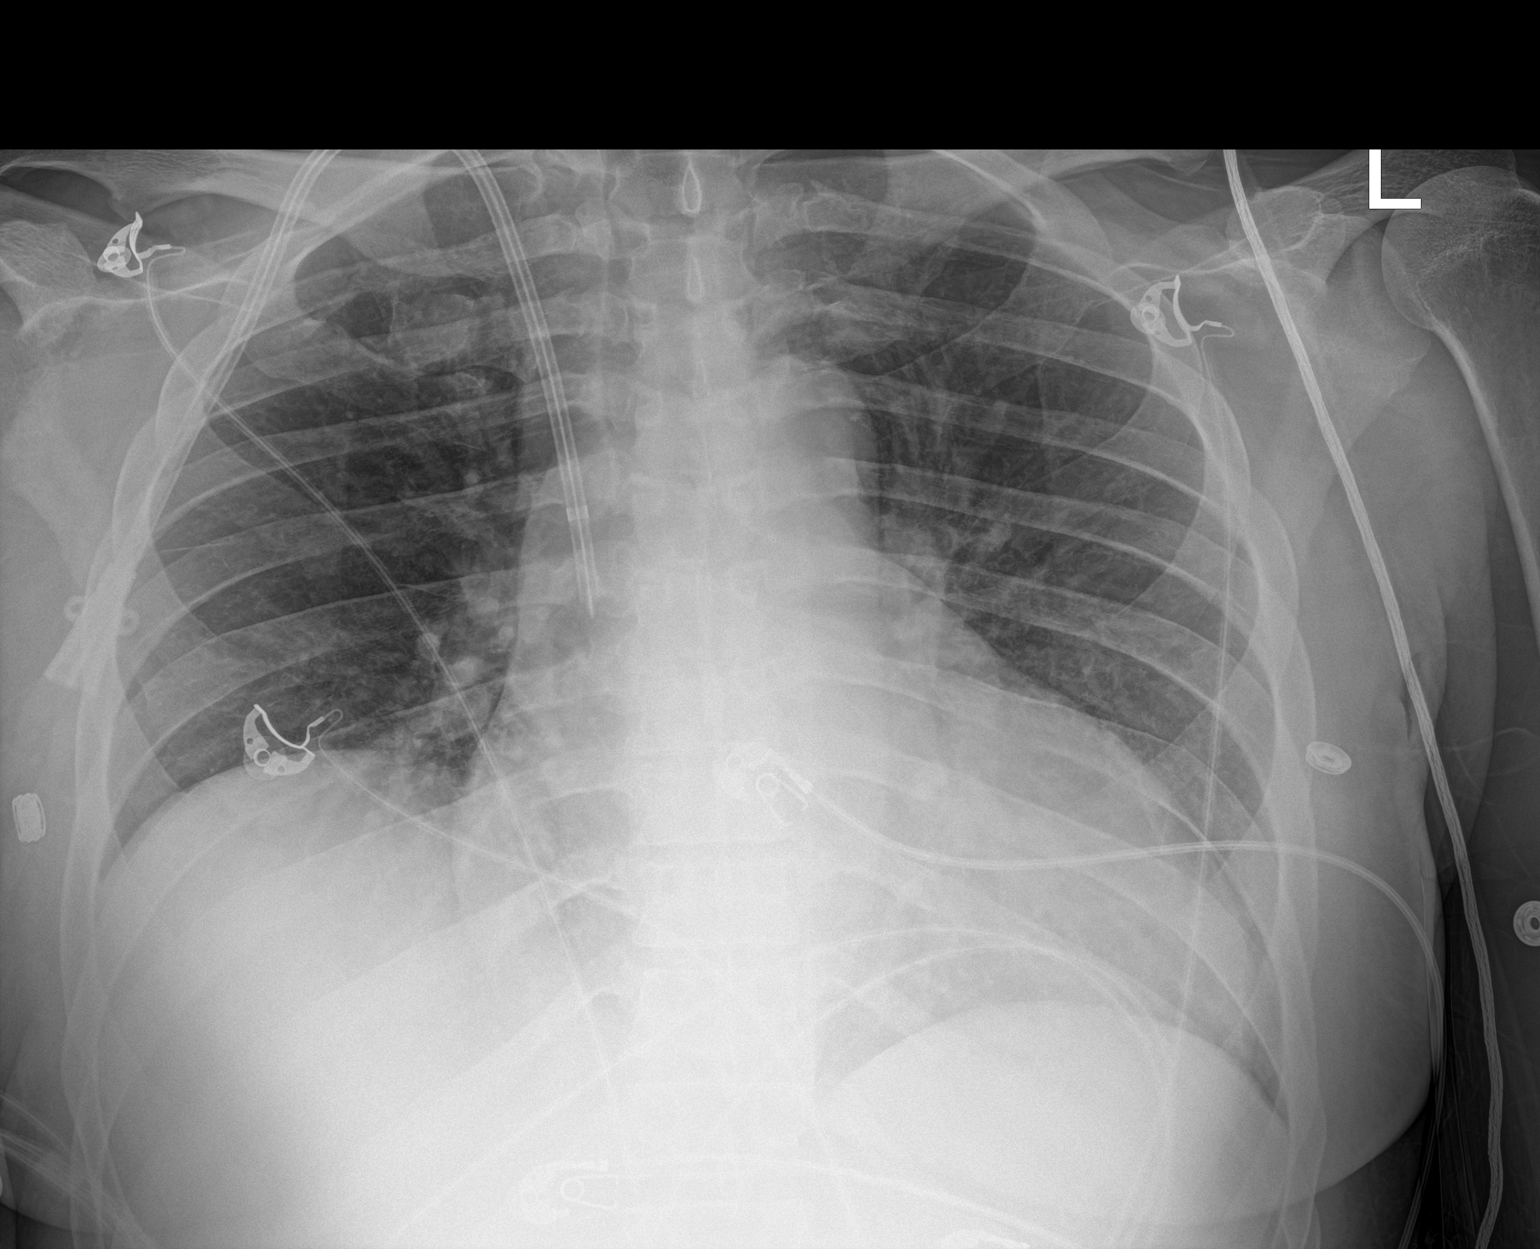

[1 of 1 positions shown; findings below may reference images not displayed]

FINDINGS: Dual lumen dialysis catheter tip terminates in the low SVC, similar
position to prior. Telemetry leads overlie the chest.

Cardiomegaly is similar to prior portable radiography with a
calcified, tortuous aorta. Overall, there is diminished pulmonary
opacity when compared to prior examination likely reflecting largely
resolved pulmonary edema from the comparison with only mild residual
pulmonary vascular congestion at this time but no features of frank
edema. No pneumothorax or visible effusion. No focal consolidative
process. No acute osseous or soft tissue abnormality.
IMPRESSION: Mild pulmonary vascular congestion without frank edema,
significantly improved from prior.

Aortic Atherosclerosis ([RM]-[RM]).

## 2020-07-02 MED ORDER — HYDRALAZINE HCL 50 MG PO TABS: 100.0000 mg | ORAL_TABLET | Freq: Three times a day (TID) | ORAL | Status: DC

## 2020-07-02 MED ORDER — PENTAFLUOROPROP-TETRAFLUOROETH EX AERO: 1.0000 "application " | INHALATION_SPRAY | CUTANEOUS | Status: DC | PRN

## 2020-07-02 MED ORDER — CHLORHEXIDINE GLUCONATE CLOTH 2 % EX PADS: 6.0000 | MEDICATED_PAD | Freq: Every day | CUTANEOUS | Status: DC

## 2020-07-02 MED ORDER — CLONIDINE HCL 0.2 MG PO TABS: 0.3000 mg | ORAL_TABLET | Freq: Three times a day (TID) | ORAL | Status: DC

## 2020-07-02 MED ORDER — SODIUM CHLORIDE 0.9 % IV SOLN: 100.0000 mL | INTRAVENOUS | Status: DC | PRN

## 2020-07-02 MED ORDER — ENALAPRILAT 1.25 MG/ML IV SOLN: 1.2500 mg | Freq: Four times a day (QID) | INTRAVENOUS | Status: DC | PRN

## 2020-07-02 MED ORDER — ALTEPLASE 2 MG IJ SOLR: 2.0000 mg | Freq: Once | INTRAMUSCULAR | Status: DC | PRN

## 2020-07-02 MED ORDER — LIDOCAINE HCL (PF) 1 % IJ SOLN: 5.0000 mL | INTRAMUSCULAR | Status: DC | PRN

## 2020-07-02 MED ORDER — INSULIN ASPART 100 UNIT/ML IV SOLN
5.0000 [IU] | Freq: Once | INTRAVENOUS | Status: AC
  Administered 2020-07-02: 5 [IU] via INTRAVENOUS

## 2020-07-02 MED ORDER — ACETAMINOPHEN 650 MG RE SUPP: 650.0000 mg | Freq: Four times a day (QID) | RECTAL | Status: DC | PRN

## 2020-07-02 MED ORDER — HEPARIN SODIUM (PORCINE) 1000 UNIT/ML IJ SOLN
INTRAMUSCULAR | Status: AC
  Administered 2020-07-02: 1000 [IU]
  Filled 2020-07-02: qty 4

## 2020-07-02 MED ORDER — ALBUTEROL SULFATE (2.5 MG/3ML) 0.083% IN NEBU
10.0000 mg | INHALATION_SOLUTION | Freq: Once | RESPIRATORY_TRACT | Status: AC
  Administered 2020-07-02: 10 mg via RESPIRATORY_TRACT
  Filled 2020-07-02: qty 12

## 2020-07-02 MED ORDER — SODIUM ZIRCONIUM CYCLOSILICATE 10 G PO PACK
10.0000 g | PACK | ORAL | Status: AC
  Administered 2020-07-02: 10 g via ORAL
  Filled 2020-07-02: qty 1

## 2020-07-02 MED ORDER — SODIUM CHLORIDE 0.9% FLUSH: 3.0000 mL | Freq: Once | INTRAVENOUS | Status: DC

## 2020-07-02 MED ORDER — HYDRALAZINE HCL 50 MG PO TABS
100.0000 mg | ORAL_TABLET | Freq: Three times a day (TID) | ORAL | Status: DC
  Administered 2020-07-02: 100 mg via ORAL
  Filled 2020-07-02: qty 2

## 2020-07-02 MED ORDER — CLONIDINE HCL 0.2 MG PO TABS
0.3000 mg | ORAL_TABLET | Freq: Three times a day (TID) | ORAL | Status: DC
  Administered 2020-07-02: 0.3 mg via ORAL
  Filled 2020-07-02: qty 1

## 2020-07-02 MED ORDER — ONDANSETRON HCL 4 MG/2ML IJ SOLN: 4.0000 mg | Freq: Four times a day (QID) | INTRAMUSCULAR | Status: DC | PRN

## 2020-07-02 MED ORDER — POLYETHYLENE GLYCOL 3350 17 G PO PACK: 17.0000 g | PACK | Freq: Every day | ORAL | Status: DC | PRN

## 2020-07-02 MED ORDER — VITAMIN D 25 MCG (1000 UNIT) PO TABS
1000.0000 [IU] | ORAL_TABLET | Freq: Every day | ORAL | Status: DC
  Administered 2020-07-02: 1000 [IU] via ORAL
  Filled 2020-07-02: qty 1

## 2020-07-02 MED ORDER — ACETAMINOPHEN 325 MG PO TABS: 650.0000 mg | ORAL_TABLET | Freq: Four times a day (QID) | ORAL | Status: DC | PRN

## 2020-07-02 MED ORDER — DEXTROSE 50 % IV SOLN
1.0000 | Freq: Once | INTRAVENOUS | Status: AC
  Administered 2020-07-02: 50 mL via INTRAVENOUS
  Filled 2020-07-02: qty 50

## 2020-07-02 MED ORDER — ONDANSETRON HCL 4 MG PO TABS: 4.0000 mg | ORAL_TABLET | Freq: Four times a day (QID) | ORAL | Status: DC | PRN

## 2020-07-02 MED ORDER — LIDOCAINE-PRILOCAINE 2.5-2.5 % EX CREA: 1.0000 "application " | TOPICAL_CREAM | CUTANEOUS | Status: DC | PRN

## 2020-07-02 MED ORDER — LABETALOL HCL 5 MG/ML IV SOLN: 10.0000 mg | INTRAVENOUS | Status: DC | PRN

## 2020-07-02 MED ORDER — CALCIUM GLUCONATE 10 % IV SOLN
1.0000 g | Freq: Once | INTRAVENOUS | Status: AC
  Administered 2020-07-02: 1 g via INTRAVENOUS
  Filled 2020-07-02: qty 10

## 2020-07-02 MED ORDER — SODIUM BICARBONATE 8.4 % IV SOLN
50.0000 meq | Freq: Once | INTRAVENOUS | Status: AC
  Administered 2020-07-02: 50 meq via INTRAVENOUS
  Filled 2020-07-02: qty 50

## 2020-07-02 NOTE — ED Notes (Signed)
Pt Placement informed pt with bed request being transported to Hemodialysis. Per pt placement no SDU bed available, Pt likely to return to ED. Charge RN [employer] Informed

## 2020-07-02 NOTE — ED Notes (Signed)
Patient had dietary consult/ education by phone with Colletta Maryland, dietician.

## 2020-07-02 NOTE — ED Notes (Signed)
X-ray at bedside
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
## 2020-07-02 NOTE — ED Notes (Signed)
Admitting at bedside 
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
## 2020-07-02 NOTE — Procedures (Signed)
Patient was seen on dialysis and the procedure was supervised.  BFR 400  Via TDC BP is  185/101.   Patient appears to be tolerating treatment well. Low K bath, UF 4 kg.  Meredith Kilbride Tanna Furry 07/02/2020

## 2020-07-02 NOTE — ED Triage Notes (Signed)
Pt BIB [facility] from home c/o weakness. Weakness started today 3 pm after dinner and taking medications. Pt states he tried to get up from chair during the afternoon and feel back onto the chair. When he attempted to get up tonight to go to the bathroom he felt weak. His legs gave out and eased himself down onto the floor. No injury no pain. Pt also c/o generalized feelings "pin and needles' throughout his body. HX dialysis. Tues/Thur/Sat. Last dialysis Saturday was short 45 minutes due to problem with dialysis port. Pt A&Ox4 on arrival. Difficulty lifting bilateral pain.
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
## 2020-07-02 NOTE — Progress Notes (Signed)
Nutrition Education Note  RD consulted for renal diet education. Attached "Eating Healthy with Kidney Disease" handout to pt discharge instructions.   Pt initially knowledgeable on foods high in potassium, sodium, and phosphorous. Explained why diet restrictions are needed and provided lists of foods to limit/avoid that are high potassium, sodium, and phosphorus. Pt diet recall obtained. Pt reports consuming a large serving of cereal (cheerios or shredded wheat) for breakfast in the morning. Provided specific recommendations on safer alternatives of these foods. Strongly encouraged compliance of this diet.   Discussed importance of protein intake at each meal and snack. Provided examples of how to maximize protein intake throughout the day. Renal-friendly beverage options discussed.  Encouraged pt to discuss specific diet questions/concerns with RD at HD outpatient facility. Teach back method used.  Expect good compliance.  Corrin Parker, MS, RD, LDN RD pager number/after hours weekend pager number on Am[employer].

## 2020-07-02 NOTE — Discharge Instructions (Signed)
Hyperkalemia Hyperkalemia is when you have too much potassium in your blood. Potassium helps your body in many ways, but having too much can cause problems. If there is too much potassium in your blood, it can affect how your heart works. Potassium is normally removed from your body by your kidneys. Many things can cause the amount in your blood to be high. Medicines and other treatments can be used to bring the amount to a normal level. Treatment may need to be done in the hospital. Follow these instructions at home:   Take over-the-counter and prescription medicines only as told by your doctor.  Do not take any of the following unless your doctor says it is okay: ? Supplements. ? Natural products. ? Herbs. ? Vitamins.  Limit how much alcohol you drink as told by your doctor.  Do not use drugs. If you need help quitting, ask your doctor.  If you have kidney disease, you may need to follow a low-potassium diet. A food specialist (dietitian) can help you.  Keep all follow-up visits as told by your doctor. This is important. Contact a doctor if:  Your heartbeat is not regular or is very slow.  You feel dizzy (light-headed).  You feel weak.  You feel sick to your stomach (nauseous).  You have tingling in your hands or feet.  You lose feeling (have numbness) in your hands or feet. Get help right away if:  You are short of breath.  You have chest pain.  You pass out (faint).  You cannot move your muscles. Summary  Hyperkalemia is when you have too much potassium in your blood.  Take over-the-counter and prescription medicines only as told by your doctor.  Limit how much alcohol you drink as told by your doctor.  Contact a doctor if your heartbeat is not regular. This information is not intended to replace advice given to you by your health care provider. Make sure you discuss any questions you have with your health care provider. Document Revised: 11/29/2017 Document  Reviewed: 11/29/2017 Elsevier Patient Education  2020 [employer]  

## 2020-07-02 NOTE — ED Notes (Signed)
Mother Eustaquio Maize can be reached at [phone].

## 2020-07-02 NOTE — ED Notes (Signed)
IV team at bedside 
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
## 2020-07-02 NOTE — ED Notes (Signed)
Phlebotomy at bedside.

## 2020-07-02 NOTE — Progress Notes (Signed)
Unable to give report d/t pt room given away. Pt tolerated HD well. 4/L removed. Post VS [id number]

## 2020-07-02 NOTE — ED Notes (Signed)
IV team unable to obtain second IV. Request RN if need of second IV for RN to request clearance with nephrology to access right arm.
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
## 2020-07-02 NOTE — ED Notes (Signed)
Mother at bedside states pt has had a TBI in the past. Mother states son no longer makes urine

## 2020-07-02 NOTE — Progress Notes (Signed)
Hyperkalemia with cardiac symptoms.  Recheck medically for PT evaluation.   07/02/20 1100  PT Visit Information  Last PT Received On 07/02/20  Reason Eval/Treat Not Completed Medical issues which prohibited therapy    Mee Hives, PT MS Acute Rehab Dept. Number: [place]

## 2020-07-16 ENCOUNTER — Other Ambulatory Visit: Payer: Self-pay

## 2020-07-16 ENCOUNTER — Ambulatory Visit (INDEPENDENT_AMBULATORY_CARE_PROVIDER_SITE_OTHER): Payer: Medicare Other | Admitting: Primary Care

## 2020-07-16 ENCOUNTER — Encounter (INDEPENDENT_AMBULATORY_CARE_PROVIDER_SITE_OTHER): Payer: Self-pay

## 2020-07-16 ENCOUNTER — Encounter (INDEPENDENT_AMBULATORY_CARE_PROVIDER_SITE_OTHER): Payer: Self-pay | Admitting: Primary Care

## 2020-07-16 ENCOUNTER — Institutional Professional Consult (permissible substitution) (INDEPENDENT_AMBULATORY_CARE_PROVIDER_SITE_OTHER): Payer: Medicaid Other | Admitting: Licensed Clinical Social Worker

## 2020-07-16 VITALS — BP 160/103 | HR 63 | Wt 226.0 lb

## 2020-07-16 DIAGNOSIS — I129 Hypertensive chronic kidney disease with stage 1 through stage 4 chronic kidney disease, or unspecified chronic kidney disease: Secondary | ICD-10-CM

## 2020-07-16 DIAGNOSIS — N184 Chronic kidney disease, stage 4 (severe): Secondary | ICD-10-CM | POA: Diagnosis not present
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
## 2020-07-26 MED FILL — cloNIDine HCL 0.3 MG TABS: 0.3 | 30 days supply | Qty: 90 | Fill #1

## 2020-07-26 MED FILL — CARVEDILOL 25 MG TABLET: 25 | 30 days supply | Qty: 60 | Fill #1

## 2020-07-26 MED FILL — hydrALAZINE HCL 100 MG TABS: 100 | 30 days supply | Qty: 90 | Fill #1

## 2020-07-26 MED FILL — QUETIAPINE FUMARATE 50 MG T: 50 | 30 days supply | Qty: 30 | Fill #1

## 2020-07-29 ENCOUNTER — Ambulatory Visit (INDEPENDENT_AMBULATORY_CARE_PROVIDER_SITE_OTHER): Payer: Self-pay | Admitting: Primary Care

## 2020-07-30 ENCOUNTER — Ambulatory Visit (INDEPENDENT_AMBULATORY_CARE_PROVIDER_SITE_OTHER): Payer: Self-pay | Admitting: Primary Care

## 2020-08-08 DIAGNOSIS — E8770 Fluid overload, unspecified: Secondary | ICD-10-CM | POA: Insufficient documentation

## 2020-08-13 ENCOUNTER — Other Ambulatory Visit: Payer: Self-pay

## 2020-08-13 DIAGNOSIS — N186 End stage renal disease: Secondary | ICD-10-CM

## 2020-08-26 ENCOUNTER — Inpatient Hospital Stay (HOSPITAL_COMMUNITY): Admission: RE | Admit: 2020-08-26 | Payer: Medicaid Other | Source: Ambulatory Visit

## 2020-08-26 ENCOUNTER — Ambulatory Visit: Payer: Medicaid Other

## 2020-08-27 ENCOUNTER — Other Ambulatory Visit: Payer: Self-pay

## 2020-08-27 DIAGNOSIS — N186 End stage renal disease: Secondary | ICD-10-CM

## 2020-09-04 ENCOUNTER — Other Ambulatory Visit: Payer: Self-pay

## 2020-09-04 ENCOUNTER — Ambulatory Visit (INDEPENDENT_AMBULATORY_CARE_PROVIDER_SITE_OTHER): Payer: Medicare Other | Admitting: Physician Assistant

## 2020-09-04 ENCOUNTER — Ambulatory Visit (HOSPITAL_COMMUNITY)
Admission: RE | Admit: 2020-09-04 | Discharge: 2020-09-04 | Disposition: A | Discharge status: Home / Self Care | Payer: Medicare Other | Source: Ambulatory Visit | Attending: Vascular Surgery | Admitting: Vascular Surgery

## 2020-09-04 VITALS — BP 177/115 | HR 78 | Temp 98.7°F | Resp 20 | Ht 77.0 in | Wt 233.0 lb

## 2020-09-04 DIAGNOSIS — Z992 Dependence on renal dialysis: Secondary | ICD-10-CM

## 2020-09-04 DIAGNOSIS — N186 End stage renal disease: Secondary | ICD-10-CM | POA: Insufficient documentation

## 2020-09-04 NOTE — Progress Notes (Signed)
Established Dialysis Access   History of Present Illness   Douglas [name] is a 34 y.o. ([birth year]) male who presents for re-evaluation of permanent access.  He is s/p R 1st stage basilic vein fistula creation with Dr. Donnetta Hutching 04/15/20.  He had been scheduled for 2nd stage basilic vein transposition 05/29/20 however this was cancelled for an unknown reason.  He is dialyzing on a TThS schedule via R IJ [place] placed by IR on 04/03/20.  Patient states he is interested in peritoneal dialysis and does not want to proceed with second stage basilic vein transposition at this time.  He has not yet discussed this with his Nephrologist.  The patient's PMH, PSH, SH, and FamHx were reviewed and are unchanged from prior visit.  Current Outpatient Medications  Medication Sig Dispense Refill  . carvedilol (COREG) 25 MG tablet Take 25 mg by mouth 2 (two) times daily with a meal.    . cholecalciferol (VITAMIN D3) 25 MCG (1000 UNIT) tablet Take 1,000 Units by mouth daily.    . Cinacalcet HCl (SENSIPAR PO) Take by mouth.    . cloNIDine (CATAPRES) 0.3 MG tablet Take 1 tablet (0.3 mg total) by mouth 3 (three) times daily. 90 tablet 1  . hydrALAZINE (APRESOLINE) 100 MG tablet Take 1 tablet (100 mg total) by mouth every 8 (eight) hours. 90 tablet 1  . iron sucrose in sodium chloride 0.9 % 100 mL Iron Sucrose (Venofer)    . LORazepam (ATIVAN) 0.5 MG tablet Take 1 tablet (0.5 mg total) by mouth 2 (two) times daily as needed for anxiety. 15 tablet 0  . Methoxy PEG-Epoetin Beta (MIRCERA IJ) Mircera    . ondansetron (ZOFRAN) 8 MG tablet Take 1 tablet (8 mg total) by mouth every 8 (eight) hours as needed for nausea or vomiting. 20 tablet 0  . QUEtiapine (SEROQUEL) 50 MG tablet Take 1 tablet (50 mg total) by mouth at bedtime. 90 tablet 1  . sevelamer carbonate (RENVELA) 800 MG tablet Take by mouth.    Marland Kitchen VITAMIN D PO Take by mouth.     No current facility-administered medications for this visit.    REVIEW OF SYSTEMS  (negative unless checked):   Cardiac:  []  Chest pain or chest pressure? []  Shortness of breath upon activity? []  Shortness of breath when lying flat? []  Irregular heart rhythm?  Vascular:  []  Pain in calf, thigh, or hip brought on by walking? []  Pain in feet at night that wakes you up from your sleep? []  Blood clot in your veins? []  Leg swelling?  Pulmonary:  []  Oxygen at home? []  Productive cough? []  Wheezing?  Neurologic:  []  Sudden weakness in arms or legs? []  Sudden numbness in arms or legs? []  Sudden onset of difficult speaking or slurred speech? []  Temporary loss of vision in one eye? []  Problems with dizziness?  Gastrointestinal:  []  Blood in stool? []  Vomited blood?  Genitourinary:  []  Burning when urinating? []  Blood in urine?  Psychiatric:  []  Major depression  Hematologic:  []  Bleeding problems? []  Problems with blood clotting?  Dermatologic:  []  Rashes or ulcers?  Constitutional:  []  Fever or chills?  Ear/Nose/Throat:  []  Change in hearing? []  Nose bleeds? []  Sore throat?  Musculoskeletal:  []  Back pain? []  Joint pain? []  Muscle pain?   Physical Examination   Vitals:   09/04/20 0951  BP: (!) 177/115  Pulse: 78  Resp: 20  Temp: 98.7 F (37.1 C)  TempSrc: Temporal  SpO2: 97%  Weight: 233 lb (105.7 kg)  Height: 6\' 5"  (1.956 m)   Body mass index is 27.63 kg/m.  General:  WDWN in NAD; vital signs documented above Gait: Not observed HENT: WNL, normocephalic Pulmonary: normal non-labored breathing , without Rales, rhonchi,  wheezing Cardiac: regular HR Abdomen: soft, NT, no masses Skin: without rashes Vascular Exam/Pulses:  Right Left  Radial 2+ (normal) 2+ (normal)   Extremities: palpable thrill R arm basilic vein fistula; palpable R radial pulse  Musculoskeletal: no muscle wasting or atrophy  Neurologic: A&O X 3;  No focal weakness or paresthesias are detected Psychiatric:  The pt has Normal affect.   Non-invasive  Vascular Imaging   right Arm Access Duplex :   Patent fistula with diameter >45mm    Medical Decision Making   Douglas [name] is a 34 y.o. male who presents with ESRD requiring hemodialysis.    Patent basilic vein fistula without signs of steal syndrome  Fistula is ready for 2nd stage basilic vein transposition  Patient does not want to proceed with 2nd stage until he discusses peritoneal dialysis with his Nephrologist; he will make the appointment  Pending discussions with Nephrology, patient can call our office to schedule 2nd stage basilic vein transposition and does not need to be seen in office   Dagoberto Ligas PA-C [facility] Office: [facility] MD: Scot Dock

## 2020-09-06 ENCOUNTER — Other Ambulatory Visit: Payer: Self-pay | Admitting: Family Medicine

## 2020-09-06 MED FILL — CARVEDILOL 25 MG TABLET: 25 | 30 days supply | Qty: 60 | Fill #2

## 2020-09-06 MED FILL — QUETIAPINE FUMARATE 50 MG T: 50 | 30 days supply | Qty: 30 | Fill #0

## 2020-09-09 ENCOUNTER — Other Ambulatory Visit: Payer: Self-pay | Admitting: Family Medicine

## 2020-09-11 ENCOUNTER — Other Ambulatory Visit (INDEPENDENT_AMBULATORY_CARE_PROVIDER_SITE_OTHER): Payer: Self-pay | Admitting: Primary Care

## 2020-09-11 ENCOUNTER — Other Ambulatory Visit: Payer: Self-pay | Admitting: Family Medicine

## 2020-09-11 NOTE — Telephone Encounter (Signed)
Copied from [place] [id number]. Topic: Quick Communication - Rx Refill/Question >> Sep 11, 2020  9:35 AM Yvette Rack wrote: Medication: cloNIDine (CATAPRES) 0.3 MG tablet and hydrALAZINE (APRESOLINE) 100 MG tablet  Has the patient contacted their pharmacy? yes   Preferred Pharmacy (with phone number or street name): [place], [place] [address]  Phone: [phone]  Fax: [phone]  Agent: Please be advised that RX refills may take up to 3 business days. We ask that you follow-up with your pharmacy.

## 2020-09-12 ENCOUNTER — Other Ambulatory Visit: Payer: Self-pay | Admitting: Primary Care

## 2020-09-12 ENCOUNTER — Telehealth (INDEPENDENT_AMBULATORY_CARE_PROVIDER_SITE_OTHER): Payer: Self-pay | Admitting: *Deleted

## 2020-09-12 MED FILL — hydrALAZINE HCL 100 MG TABS: 100 | 30 days supply | Qty: 90 | Fill #0

## 2020-09-12 MED FILL — cloNIDine HCL 0.3 MG TABS: 0.3 | 30 days supply | Qty: 90 | Fill #0

## 2020-09-12 NOTE — Telephone Encounter (Signed)
Patient's mother requesting medication refills today for clonidine 0.3 mg Take 1 tab by mouth three times daily and hydralazine 100 mg Take one tablet by mouth every eight hours. #90 tabs with one refill.TC to patient's pharmacy provided verbal order.

## 2020-10-08 ENCOUNTER — Telehealth: Payer: Self-pay

## 2020-10-08 NOTE — Telephone Encounter (Signed)
Spoke with Melissa at [facility] and informed of patient's wishes not to proceed with second stage BVT surgery. She voiced understanding.

## 2020-10-08 NOTE — Telephone Encounter (Signed)
Spoke to patient's mother, Janifer Adie. She said that patient did not want to have access surgery in his arm. He is consulting with his nephrologist for PD cath placement with a general surgeon. Instructed her to call back/have patient call back if they needed anything further from our service. She verbalized understanding.

## 2020-10-16 ENCOUNTER — Encounter (INDEPENDENT_AMBULATORY_CARE_PROVIDER_SITE_OTHER): Payer: Self-pay | Admitting: Primary Care

## 2020-10-16 ENCOUNTER — Other Ambulatory Visit: Payer: Self-pay

## 2020-10-16 ENCOUNTER — Ambulatory Visit (INDEPENDENT_AMBULATORY_CARE_PROVIDER_SITE_OTHER): Payer: Medicare Other | Admitting: Primary Care

## 2020-10-16 VITALS — BP 189/126 | HR 84 | Temp 97.9°F | Ht 77.0 in | Wt 236.4 lb

## 2020-10-16 DIAGNOSIS — I129 Hypertensive chronic kidney disease with stage 1 through stage 4 chronic kidney disease, or unspecified chronic kidney disease: Secondary | ICD-10-CM

## 2020-10-16 DIAGNOSIS — J411 Mucopurulent chronic bronchitis: Secondary | ICD-10-CM

## 2020-10-16 DIAGNOSIS — N184 Chronic kidney disease, stage 4 (severe): Secondary | ICD-10-CM | POA: Diagnosis not present
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
## 2020-10-16 NOTE — Patient Instructions (Signed)
Preventive Care 19-34 Years Old, Male Preventive care refers to lifestyle choices and visits with your health care provider that can promote health and wellness. This includes:  A yearly physical exam. This is also called an annual well check.  Regular dental and eye exams.  Immunizations.  Screening for certain conditions.  Healthy lifestyle choices, such as eating a healthy diet, getting regular exercise, not using drugs or products that contain nicotine and tobacco, and limiting alcohol use. What can I expect for my preventive care visit? Physical exam Your health care provider will check:  Height and weight. These may be used to calculate body mass index (BMI), which is a measurement that tells if you are at a healthy weight.  Heart rate and blood pressure.  Your skin for abnormal spots. Counseling Your health care provider may ask you questions about:  Alcohol, tobacco, and drug use.  Emotional well-being.  Home and relationship well-being.  Sexual activity.  Eating habits.  Work and work Statistician. What immunizations do I need?  Influenza (flu) vaccine  This is recommended every year. Tetanus, diphtheria, and pertussis (Tdap) vaccine  You may need a Td booster every 10 years. Varicella (chickenpox) vaccine  You may need this vaccine if you have not already been vaccinated. Human papillomavirus (HPV) vaccine  If recommended by your health care provider, you may need three doses over 6 months. Measles, mumps, and rubella (MMR) vaccine  You may need at least one dose of MMR. You may also need a second dose. Meningococcal conjugate (MenACWY) vaccine  One dose is recommended if you are 45-76 years old and a Market researcher living in a residence hall, or if you have one of several medical conditions. You may also need additional booster doses. Pneumococcal conjugate (PCV13) vaccine  You may need this if you have certain conditions and were not  previously vaccinated. Pneumococcal polysaccharide (PPSV23) vaccine  You may need one or two doses if you smoke cigarettes or if you have certain conditions. Hepatitis A vaccine  You may need this if you have certain conditions or if you travel or work in places where you may be exposed to hepatitis A. Hepatitis B vaccine  You may need this if you have certain conditions or if you travel or work in places where you may be exposed to hepatitis B. Haemophilus influenzae type b (Hib) vaccine  You may need this if you have certain risk factors. You may receive vaccines as individual doses or as more than one vaccine together in one shot (combination vaccines). Talk with your health care provider about the risks and benefits of combination vaccines. What tests do I need? Blood tests  Lipid and cholesterol levels. These may be checked every 5 years starting at age 17.  Hepatitis C test.  Hepatitis B test. Screening   Diabetes screening. This is done by checking your blood sugar (glucose) after you have not eaten for a while (fasting).  Sexually transmitted disease (STD) testing. Talk with your health care provider about your test results, treatment options, and if necessary, the need for more tests. Follow these instructions at home: Eating and drinking   Eat a diet that includes fresh fruits and vegetables, whole grains, lean protein, and low-fat dairy products.  Take vitamin and mineral supplements as recommended by your health care provider.  Do not drink alcohol if your health care provider tells you not to drink.  If you drink alcohol: ? Limit how much you have to 0-2  drinks a day. ? Be aware of how much alcohol is in your drink. In the [place] one drink equals one 12 oz bottle of beer (355 mL), one 5 oz glass of wine (148 mL), or one 1 oz glass of hard liquor (44 mL). Lifestyle  Take daily care of your teeth and gums.  Stay active. Exercise for at least 30 minutes on 5 or  more days each week.  Do not use any products that contain nicotine or tobacco, such as cigarettes, e-cigarettes, and chewing tobacco. If you need help quitting, ask your health care provider.  If you are sexually active, practice safe sex. Use a condom or other form of protection to prevent STIs (sexually transmitted infections). What's next?  Go to your health care provider once a year for a well check visit.  Ask your health care provider how often you should have your eyes and teeth checked.  Stay up to date on all vaccines. This information is not intended to replace advice given to you by your health care provider. Make sure you discuss any questions you have with your health care provider. Document Revised: 12/08/2018 Document Reviewed: 12/08/2018 Elsevier Patient Education  2020 [employer].

## 2020-10-24 ENCOUNTER — Telehealth: Payer: Self-pay | Admitting: *Deleted

## 2020-10-24 NOTE — Telephone Encounter (Signed)
Received call from [facility] at [facility] requesting pt be contacted to schedule 2nd stage BVT. Called and spoke with pts mother. She stated that pt is still pursuing peritoneal dialysis and does not wish to schedule surgery at this time. Instructed pts mother to call or have pt call to schedule surgery when they are ready.

## 2020-10-24 NOTE — Telephone Encounter (Signed)
Contacted Melissa at [facility] and informed her of patient's wishes per mother as indicated in previous notes. Advised will scheduled patient for surgery when ready. Voiced understanding.

## 2020-11-06 ENCOUNTER — Other Ambulatory Visit (INDEPENDENT_AMBULATORY_CARE_PROVIDER_SITE_OTHER): Payer: Self-pay | Admitting: Primary Care

## 2020-11-06 MED FILL — cloNIDine HCL 0.3 MG TABS: 0.3 | 30 days supply | Qty: 90 | Fill #1

## 2020-11-06 MED FILL — hydrALAZINE HCL 100 MG TABS: 100 | 30 days supply | Qty: 90 | Fill #1

## 2020-11-06 MED FILL — CARVEDILOL 25 MG TABLET: 25 | 30 days supply | Qty: 60 | Fill #3

## 2020-11-06 NOTE — Telephone Encounter (Signed)
  Last refill: 04/15/2020  Future visit scheduled: no  Notes to clinic:  medication was filled by a historical provider Review for refill   Requested Prescriptions  Pending Prescriptions Disp Refills   sevelamer carbonate (RENVELA) 800 MG tablet [Pharmacy Med Name: SEVELAMER CARBONATE 800MG  800 Tablet] 270 tablet 0    Sig: TAKE 3 TABLETS (2,400 MG TOTAL) BY MOUTH THREE TIMES DAILY WITH MEALS.      Endocrinology:  Phosphate Binders Failed - 11/06/2020  1:39 PM      Failed - Ca in normal range and within 360 days    Calcium  Date Value Ref Range Status  07/02/2020 8.3 (L) 8.9 - 10.3 mg/dL Final   Calcium, Ion  Date Value Ref Range Status  03/31/2020 1.05 (L) 1.15 - 1.40 mmol/L Final          Failed - Phosphate in normal range and within 360 days    Phosphorus  Date Value Ref Range Status  07/02/2020 5.4 (H) 2.5 - 4.6 mg/dL Final          Failed - Albumin in normal range and within 360 days    Albumin  Date Value Ref Range Status  07/02/2020 3.3 (L) 3.5 - 5.0 g/dL Final          Failed - Cr in normal range and within 360 days    Creatinine, Ser  Date Value Ref Range Status  07/02/2020 7.47 (H) 0.61 - 1.24 mg/dL Final    Comment:    DELTA CHECK NOTED DIALYSIS    Creatinine, Urine  Date Value Ref Range Status  03/31/2020 91.43 mg/dL Final    Comment:    Performed at [facility], [place] [address] [place], [place] [address]          Failed - PTH in normal range and within 360 days    PTH  Date Value Ref Range Status  04/01/2020 315 (H) 15 - 65 pg/mL Final    Comment:    (NOTE) Performed At: [facility] [place], [place] [id number] Rush Farmer MD QX:[id number]           [id number] - Valid encounter within last 12 months    Recent Outpatient Visits           3 weeks ago Mucopurulent chronic bronchitis ([place])   [place] [facility] [name] P, NP   3 months ago Hypertensive renal disease, malignant,  with renal failure   [place] [name] P, NP   4 months ago Non-intractable vomiting with nausea, unspecified vomiting type   [place] Kerin Perna, NP   6 months ago Hypertensive renal disease, malignant, with renal failure   [place] Kerin Perna, NP

## 2020-11-15 ENCOUNTER — Other Ambulatory Visit: Payer: Self-pay | Admitting: Nephrology

## 2020-11-15 MED FILL — SEVELAMER CARBONATE 800 MG: 800 | 30 days supply | Qty: 480 | Fill #0

## 2020-12-10 MED FILL — QUETIAPINE FUMARATE 50 MG T: 50 | 30 days supply | Qty: 30 | Fill #1

## 2020-12-26 ENCOUNTER — Other Ambulatory Visit (INDEPENDENT_AMBULATORY_CARE_PROVIDER_SITE_OTHER): Payer: Self-pay | Admitting: Primary Care

## 2020-12-26 MED FILL — SEVELAMER CARBONATE 800 MG: 800 | 30 days supply | Qty: 480 | Fill #1

## 2020-12-26 NOTE — Telephone Encounter (Signed)
Sent to PCP to refill if appropriate.  

## 2020-12-27 ENCOUNTER — Other Ambulatory Visit (INDEPENDENT_AMBULATORY_CARE_PROVIDER_SITE_OTHER): Payer: Self-pay | Admitting: Primary Care

## 2020-12-27 DIAGNOSIS — I160 Hypertensive urgency: Secondary | ICD-10-CM

## 2020-12-27 MED ORDER — CLONIDINE HCL 0.3 MG PO TABS: 0.3000 mg | ORAL_TABLET | Freq: Three times a day (TID) | ORAL | 1 refills | Status: DC

## 2020-12-27 MED ORDER — HYDRALAZINE HCL 100 MG PO TABS: 100.0000 mg | ORAL_TABLET | Freq: Three times a day (TID) | ORAL | 1 refills | Status: DC

## 2020-12-30 MED FILL — cloNIDine HCL 0.3 MG TABS: 0.3 | 10 days supply | Qty: 30 | Fill #0

## 2021-01-13 MED FILL — cloNIDine HCL 0.3 MG TABS: 0.3 | 10 days supply | Qty: 30 | Fill #1

## 2021-01-24 MED FILL — cloNIDine HCL 0.3 MG TABS: 0.3 | 30 days supply | Qty: 90 | Fill #0

## 2021-01-30 DIAGNOSIS — Z0289 Encounter for other administrative examinations: Secondary | ICD-10-CM

## 2021-02-11 MED FILL — QUETIAPINE FUMARATE 50 MG T: 50 | 30 days supply | Qty: 30 | Fill #2

## 2021-02-25 MED FILL — cloNIDine HCL 0.3 MG TABS: 0.3 | 30 days supply | Qty: 90 | Fill #1

## 2021-03-06 ENCOUNTER — Other Ambulatory Visit: Payer: Self-pay | Admitting: Nephrology

## 2021-03-06 MED FILL — LOSARTAN POTASSIUM 50 MG TA: 50 | 90 days supply | Qty: 90 | Fill #0

## 2021-03-26 ENCOUNTER — Other Ambulatory Visit (INDEPENDENT_AMBULATORY_CARE_PROVIDER_SITE_OTHER): Payer: Self-pay | Admitting: Primary Care

## 2021-03-27 MED FILL — cloNIDine HCL 0.3 MG TABS: 0.3 | 30 days supply | Qty: 90 | Fill #0

## 2021-04-17 ENCOUNTER — Other Ambulatory Visit: Payer: Self-pay

## 2021-04-17 MED FILL — Quetiapine Fumarate Tab 50 MG: ORAL | 90 days supply | Qty: 90 | Fill #0 | Status: AC

## 2021-04-25 ENCOUNTER — Other Ambulatory Visit (INDEPENDENT_AMBULATORY_CARE_PROVIDER_SITE_OTHER): Payer: Self-pay | Admitting: Primary Care

## 2021-04-25 NOTE — Telephone Encounter (Signed)
Requested medication (s) are due for refill today:yes  Requested medication (s) are on the active medication list: yes   Last refill: 03/27/2021  Future visit scheduled: no  Notes to clinic:  overdue for 6 month follow up  Attempted to contact patient no answer and vm is full    Requested Prescriptions  Pending Prescriptions Disp Refills   cloNIDine (CATAPRES) 0.3 MG tablet 90 tablet 0    Sig: TAKE 1 TABLET (0.3 MG TOTAL) BY MOUTH 3 (THREE) TIMES DAILY.      Cardiovascular:  Alpha-2 Agonists Failed - 04/25/2021  9:22 AM      Failed - Last BP in normal range    BP Readings from Last 1 Encounters:  10/16/20 (!) 189/126          Failed - Valid encounter within last 6 months    Recent Outpatient Visits           6 months ago Mucopurulent chronic bronchitis ([place])   [place] [facility] [name] P, NP   9 months ago Hypertensive renal disease, malignant, with renal failure   [place] [name] P, NP   10 months ago Non-intractable vomiting with nausea, unspecified vomiting type   [facility] Kerin Perna, NP   12 months ago Hypertensive renal disease, malignant, with renal failure   [place] Kerin Perna, NP                Passed - Last Heart Rate in normal range    Pulse Readings from Last 1 Encounters:  10/16/20 84

## 2021-04-29 ENCOUNTER — Other Ambulatory Visit (INDEPENDENT_AMBULATORY_CARE_PROVIDER_SITE_OTHER): Payer: Self-pay | Admitting: Primary Care

## 2021-04-29 ENCOUNTER — Other Ambulatory Visit: Payer: Self-pay

## 2021-04-29 ENCOUNTER — Ambulatory Visit: Payer: Self-pay

## 2021-04-29 MED ORDER — CLONIDINE HCL 0.3 MG PO TABS
ORAL_TABLET | ORAL | 0 refills | Status: DC
  Filled 2021-04-29: qty 90, 30d supply, fill #0

## 2021-04-29 NOTE — Telephone Encounter (Signed)
Requested medication (s) are due for refill today: Yes  Requested medication (s) are on the active medication list: Yes  Last refill:  03/26/21  Future visit scheduled: No  Notes to clinic:  Unable to reach pt. To make appointment.    Requested Prescriptions  Pending Prescriptions Disp Refills   cloNIDine (CATAPRES) 0.3 MG tablet 90 tablet 0    Sig: TAKE 1 TABLET (0.3 MG TOTAL) BY MOUTH 3 (THREE) TIMES DAILY.      Cardiovascular:  Alpha-2 Agonists Failed - 04/29/2021 11:12 AM      Failed - Last BP in normal range    BP Readings from Last 1 Encounters:  10/16/20 (!) 189/126          Failed - Valid encounter within last 6 months    Recent Outpatient Visits           6 months ago Mucopurulent chronic bronchitis ([place])   [place] [facility] [name] P, NP   9 months ago Hypertensive renal disease, malignant, with renal failure   [place] [name] P, NP   10 months ago Non-intractable vomiting with nausea, unspecified vomiting type   [facility] Kerin Perna, NP   1 year ago Hypertensive renal disease, malignant, with renal failure   [place] Kerin Perna, NP                Passed - Last Heart Rate in normal range    Pulse Readings from Last 1 Encounters:  10/16/20 84

## 2021-04-29 NOTE — Telephone Encounter (Signed)
Mom requesting Clonidine refill and needs 90 day refill. Needs today as he is out of med- Pt had hemodialysis today and mother stated his BP was much improved. Pt was sleeping at the time of the call. Pt's mother stated pt did not have any new physical complaints before lying down.  Before taking his clonidine, pt's BP was 220-230/150-90. Advised mother to retake BP and call back if still high.  Appt schedule 05/09/21 (which is PCP first available) Last RF of clonidine:03/26/21 for a 30 day supply. Last OV that addressed issue: 10/16/20.  Routing back to office to review if 90 day refill is appropriate.   Reason for Disposition . [id number] Systolic BP  >= [id number] OR Diastolic >= 123456  AND [id number] having NO cardiac or neurologic symptoms  Answer Assessment - Initial Assessment Questions 1. BLOOD PRESSURE: "What is the blood pressure?" "Did you take at least two measurements 5 minutes apart?"     BP taken prior to medication: Mom stated that BP ranging 220-230/150-90 2. ONSET: "When did you take your blood pressure?"     ths AM 3. HOW: "How did you obtain the blood pressure?" (e.g., visiting nurse, automatic home BP monitor)     BP monitor 4. HISTORY: "Do you have a history of high blood pressure?"     yes 5. MEDICATIONS: "Are you taking any medications for blood pressure?" "Have you missed any doses recently?"     Yes/no missed doses 6. OTHER SYMPTOMS: "Do you have any symptoms?" (e.g., headache, chest pain, blurred vision, difficulty breathing, weakness)     None per mother  Protocols used: BLOOD PRESSURE - HIGH-A-AH

## 2021-04-29 NOTE — Telephone Encounter (Signed)
Please read triage note . Pt's mother requesting 90 day supply. OV scheduled next Friday 05/09/21.

## 2021-04-29 NOTE — Telephone Encounter (Signed)
Pt's mother called to report that the patient is completely out of his current supply, she says this has been requested for many days. She is asking for a 3 month supply

## 2021-04-30 ENCOUNTER — Other Ambulatory Visit: Payer: Self-pay

## 2021-05-09 ENCOUNTER — Encounter (INDEPENDENT_AMBULATORY_CARE_PROVIDER_SITE_OTHER): Payer: Self-pay | Admitting: Primary Care

## 2021-05-09 ENCOUNTER — Other Ambulatory Visit: Payer: Self-pay

## 2021-05-09 ENCOUNTER — Ambulatory Visit (INDEPENDENT_AMBULATORY_CARE_PROVIDER_SITE_OTHER): Payer: Medicare Other | Admitting: Primary Care

## 2021-05-09 VITALS — BP 159/105 | HR 60 | Temp 97.3°F | Ht 77.0 in | Wt 224.8 lb

## 2021-05-09 DIAGNOSIS — F32A Depression, unspecified: Secondary | ICD-10-CM | POA: Diagnosis not present

## 2021-05-09 DIAGNOSIS — I129 Hypertensive chronic kidney disease with stage 1 through stage 4 chronic kidney disease, or unspecified chronic kidney disease: Secondary | ICD-10-CM | POA: Diagnosis not present

## 2021-05-09 MED ORDER — HYDRALAZINE HCL 100 MG PO TABS
ORAL_TABLET | Freq: Three times a day (TID) | ORAL | 1 refills | Status: DC
  Filled 2021-05-09: qty 90, 30d supply, fill #0

## 2021-05-09 MED ORDER — CARVEDILOL 25 MG PO TABS
25.0000 mg | ORAL_TABLET | Freq: Two times a day (BID) | ORAL | 1 refills | Status: DC
  Filled 2021-05-09: qty 180, 90d supply, fill #0

## 2021-05-09 MED ORDER — QUETIAPINE FUMARATE 50 MG PO TABS
ORAL_TABLET | Freq: Every day | ORAL | 1 refills | Status: DC
  Filled 2021-05-09: qty 90, fill #0
  Filled 2021-11-24: qty 90, 90d supply, fill #0

## 2021-05-09 NOTE — Patient Instructions (Signed)
Renovascular Hypertension  Renovascular hypertension is high blood pressure that happens when the arteries that carry blood to the kidneys (renal arteries) become narrow. Blood pressure is a measurement of how strongly the blood presses against the walls of the arteries. Hypertension, or high blood pressure, is a condition in which blood pressure is higher than normal. Untreated hypertension, including renovascular hypertension, can lead to various health problems, such as:  Heart failure.  Heart disease.  Stroke.  Kidney failure.  Blood vessel damage.  Blindness or vision problems.  A type of dementia caused by reduced blood flow to the brain (vascular dementia). What are the causes? Many conditions can cause the renal arteries to become narrow and lead to renovascular hypertension. Some of these are:  Atherosclerosis. This is a hardening of the renal arteries. It causes plaque to build up and block the renal arteries.  Fibromuscular hyperplasia. This is a condition in which cells of the artery wall overgrow, causing a narrowing of the renal arteries. It is a common cause of renovascular hypertension in younger women.  A blockage in the renal artery due to injury, tumors, or blood clots. This is rare. What increases the risk? You are more likely to develop this condition if:  You are a woman who is younger than age 76.  You are a man who is older than age 91.  You have a history of heart problems or strokes. What are the signs or symptoms? In many cases, there are no symptoms. If symptoms are present, they may include:  Sudden high blood pressure that gets worse in older people who previously had well-controlled blood pressure.  Nausea and vomiting.  Vision problems.  Chest pain.  Flash pulmonary edema, which is sudden shortness of breath due to fluid buildup in the lungs. How is this diagnosed? This condition may be diagnosed based on:  A physical exam and blood  pressure check. During the exam, your health care provider may use a stethoscope to listen for a "whooshing" noise over the abdomen or on either side between the ribs and the hip.  Blood tests to measure renin (an enzyme that helps control your blood pressure) and to check your hormone levels, including a hormone called aldosterone. Aldosterone controls the salt and water balance in your body.  Imaging tests. These may include: ? An ultrasound. This test uses sound waves to produce an image of the inside of your body. ? A CT scan of your abdomen. ? An MRI of the arteries that supply the kidneys. ? A renal angiogram. For this test, a dye is injected into a kidney artery to show narrowing of the artery on an X-ray. How is this treated? This condition may be treated with:  Medicines to help you control your blood pressure.  Treatments or lifestyle changes to address factors or conditions that may be contributing to high blood pressure. This may mean lowering your cholesterol, eating a heart-healthy diet, exercising, and maintaining a healthy body weight.  Surgery to remove a blockage. This may be necessary if a renal artery is blocked badly.  Percutaneous transluminal renal angioplasty (PTRA). This is a procedure to open narrow renal arteries if they are not completely blocked. Sometimes, a stent is placed in the artery to prevent the artery from becoming blocked again. Follow these instructions at home: Monitoring your blood pressure  Monitor your blood pressure at home as told by your health care provider. ? A blood pressure reading is recorded as two numbers, such  as 120 over 80 (or 120/80). The first ("top") number is called the systolic pressure. It is a measure of the pressure in your arteries when your heart beats. The second ("bottom") number is called the diastolic pressure. It is a measure of the pressure in your arteries as your heart relaxes between beats. A normal blood pressure  reading is:  Systolic: below 123456.  Diastolic: below 80. ? Your personal target blood pressure may vary depending on your medical conditions, your age, and other factors.  Have your blood pressure rechecked as told by your health care provider.   Lifestyle  Work with your health care provider to maintain a healthy body weight or to lose weight. Ask what a healthy weight is for you.  Get regular exercise. Try to do moderate exercise for at least 150 minutes each week (30 minutes on most days of the week). Moderate exercise can include walking, yoga, or gardening.  Do not use any products that contain nicotine or tobacco, such as cigarettes, e-cigarettes, and chewing tobacco. If you need help quitting, ask your health care provider.  Do not use street drugs. Eating and drinking  Eat a heart-healthy diet. This may include: ? Following the DASH diet. This diet is high in fruits, vegetables, and whole grains. It is low in salt (sodium), saturated fat, and added sugars. ? Keeping your sodium intake below 1,500 mg per day. Do not add salt to your food. Check food labels to see how much sodium is in a food or beverage.  Do not drink alcohol if: ? Your health care provider tells you not to drink. ? You are pregnant, may be pregnant, or are planning to become pregnant.  If you drink alcohol: ? Limit how much you use to:  0-1 drink a day for women.  0-2 drinks a day for men. ? Be aware of how much alcohol is in your drink. In the [place] one drink equals one 12 oz bottle of beer (355 mL), one 5 oz glass of wine (148 mL), or one 1 oz glass of hard liquor (44 mL).  Limit caffeine. Caffeine may make your renovascular hypertension worse. Check ingredients and nutrition facts to see if a food or beverage contains caffeine.   General instructions  Take over-the-counter and prescription medicines only as told by your health care provider.  Keep all follow-up visits as told by your health care  provider. This is important. Where to find more information  For information on the DASH diet, go to the [employer] website: www.kidney.org Contact a health care provider if:  Your symptoms continue to get worse and medicine does not help.  You have a fever. Get help right away if:  You have shortness of breath.  You have numbness on one side.  You have muscle weakness.  You are unable to speak.  You feel light-headed or you faint.  You have sudden spikes of high blood pressure.  You have very high blood pressure. These symptoms may represent a serious problem that is an emergency. Do not wait to see if the symptoms will go away. Get medical help right away. Call your local emergency services (911 in the [place] Do not drive yourself to the hospital. Summary  Renovascular hypertension is high blood pressure that happens when the arteries that carry blood to the kidneys (renal arteries) become narrow.  In many cases, there are no symptoms for this condition.  Treatments for renovascular hypertension include medicines, lifestyle changes, surgery  to remove a blockage, or a procedure to open a narrow artery.  Monitor your blood pressure at home as told by your health care provider. This information is not intended to replace advice given to you by your health care provider. Make sure you discuss any questions you have with your health care provider. Document Revised: 12/09/2019 Document Reviewed: 12/09/2019 Elsevier Patient Education  2021 [employer].
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
## 2021-05-16 ENCOUNTER — Other Ambulatory Visit: Payer: Self-pay

## 2021-05-23 ENCOUNTER — Other Ambulatory Visit: Payer: Self-pay

## 2021-05-23 ENCOUNTER — Other Ambulatory Visit (INDEPENDENT_AMBULATORY_CARE_PROVIDER_SITE_OTHER): Payer: Self-pay | Admitting: Family Medicine

## 2021-05-23 MED ORDER — CLONIDINE HCL 0.3 MG PO TABS
ORAL_TABLET | ORAL | 0 refills | Status: DC
  Filled 2021-05-23: qty 90, 30d supply, fill #0

## 2021-05-23 NOTE — Telephone Encounter (Signed)
Requested Prescriptions  Pending Prescriptions Disp Refills  . cloNIDine (CATAPRES) 0.3 MG tablet 90 tablet 0    Sig: TAKE 1 TABLET (0.3 MG TOTAL) BY MOUTH 3 (THREE) TIMES DAILY.     Cardiovascular:  Alpha-2 Agonists Failed - 05/23/2021 10:35 AM      Failed - Last BP in normal range    BP Readings from Last 1 Encounters:  05/09/21 (!) 159/105         Passed - Last Heart Rate in normal range    Pulse Readings from Last 1 Encounters:  05/09/21 60         Passed - Valid encounter within last 6 months    Recent Outpatient Visits          2 weeks ago Depression, unspecified depression type   [place] [name] P, NP   7 months ago Mucopurulent chronic bronchitis ([place])   [place] [facility] [name] P, NP   10 months ago Hypertensive renal disease, malignant, with renal failure   [place], [name] P, NP   11 months ago Non-intractable vomiting with nausea, unspecified vomiting type   [facility] Kerin Perna, NP   1 year ago Hypertensive renal disease, malignant, with renal failure   [place] Kerin Perna, NP      Future Appointments            In 2 weeks Oletta Lamas, Milford Cage, NP [place]

## 2021-06-06 ENCOUNTER — Ambulatory Visit (INDEPENDENT_AMBULATORY_CARE_PROVIDER_SITE_OTHER): Payer: Medicare Other | Admitting: Primary Care

## 2021-06-16 ENCOUNTER — Ambulatory Visit: Payer: Medicare Other | Admitting: Podiatry

## 2021-06-23 ENCOUNTER — Other Ambulatory Visit: Payer: Self-pay

## 2021-06-23 ENCOUNTER — Ambulatory Visit (INDEPENDENT_AMBULATORY_CARE_PROVIDER_SITE_OTHER): Payer: Medicare Other | Admitting: Podiatry

## 2021-06-23 ENCOUNTER — Encounter: Payer: Self-pay | Admitting: Podiatry

## 2021-06-23 DIAGNOSIS — L84 Corns and callosities: Secondary | ICD-10-CM

## 2021-06-23 DIAGNOSIS — L309 Dermatitis, unspecified: Secondary | ICD-10-CM | POA: Diagnosis not present

## 2021-06-23 NOTE — Progress Notes (Signed)
Subjective:   Patient ID: Douglas [name], male   DOB: 35 y.o.   MRN: [id number]   HPI Patient presents stating that the lesion underneath the left foot is very tender and makes it hard to walk on and also the big toe.  Patient states that this is been ongoing and has gotten gradually worse over time and he smokes it sporadically and tries to be active   Review of Systems  All other systems reviewed and are negative.      Objective:  Physical Exam Vitals and nursing note reviewed.  Constitutional:      Appearance: He is well-developed.  Pulmonary:     Effort: Pulmonary effort is normal.  Musculoskeletal:        General: Normal range of motion.  Skin:    General: Skin is warm.  Neurological:     Mental Status: He is alert.    Neurovascular status found to be intact muscle strength found to be adequate patient noted to have thick tissue bilateral on the left side with patient having history of being on dialysis which does make his skin dry.  Patient presents with caregiver today and states that he has not had any breakdown of tissue or drainage     Assessment:  Probability that this is more of a skin reaction with also dialysis being a contributing factor     Plan:  H&P reviewed condition and recommended continuing to work on the skin from a conservative standpoint and I do not recommend aggressive removal and I gave him all kinds of different things to watch out for that may ultimately require further treatment if any breakdown of tissue were to occur

## 2021-06-25 ENCOUNTER — Other Ambulatory Visit (INDEPENDENT_AMBULATORY_CARE_PROVIDER_SITE_OTHER): Payer: Self-pay | Admitting: Family Medicine

## 2021-06-25 ENCOUNTER — Other Ambulatory Visit: Payer: Self-pay

## 2021-06-25 MED ORDER — CLONIDINE HCL 0.3 MG PO TABS
ORAL_TABLET | ORAL | 0 refills | Status: DC
  Filled 2021-06-25: qty 90, 30d supply, fill #0

## 2021-07-09 ENCOUNTER — Encounter (INDEPENDENT_AMBULATORY_CARE_PROVIDER_SITE_OTHER): Payer: Self-pay | Admitting: Primary Care

## 2021-07-09 ENCOUNTER — Other Ambulatory Visit: Payer: Self-pay

## 2021-07-09 ENCOUNTER — Ambulatory Visit (INDEPENDENT_AMBULATORY_CARE_PROVIDER_SITE_OTHER): Payer: Medicare Other | Admitting: Primary Care

## 2021-07-09 VITALS — BP 164/106 | HR 78 | Temp 97.9°F | Ht 77.0 in | Wt 225.4 lb

## 2021-07-09 DIAGNOSIS — R0689 Other abnormalities of breathing: Secondary | ICD-10-CM

## 2021-07-09 DIAGNOSIS — I129 Hypertensive chronic kidney disease with stage 1 through stage 4 chronic kidney disease, or unspecified chronic kidney disease: Secondary | ICD-10-CM | POA: Diagnosis not present

## 2021-07-09 DIAGNOSIS — F32A Depression, unspecified: Secondary | ICD-10-CM
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
## 2021-07-10 ENCOUNTER — Telehealth (INDEPENDENT_AMBULATORY_CARE_PROVIDER_SITE_OTHER): Payer: Self-pay | Admitting: Clinical

## 2021-07-15 NOTE — Telephone Encounter (Signed)
Second attempt to reach pt, no answer, left voicemail.

## 2021-07-15 NOTE — Telephone Encounter (Signed)
Patients mother Ellin Mayhew returning call, she can be reached at [phone]

## 2021-07-17 ENCOUNTER — Other Ambulatory Visit (INDEPENDENT_AMBULATORY_CARE_PROVIDER_SITE_OTHER): Payer: Self-pay | Admitting: Primary Care

## 2021-07-17 ENCOUNTER — Other Ambulatory Visit: Payer: Self-pay

## 2021-07-17 MED ORDER — CLONIDINE HCL 0.3 MG PO TABS
ORAL_TABLET | ORAL | 0 refills | Status: DC
  Filled 2021-07-17: qty 90, 30d supply, fill #0

## 2021-07-18 ENCOUNTER — Other Ambulatory Visit: Payer: Self-pay

## 2021-07-18 NOTE — Telephone Encounter (Signed)
Spoke with pt's mom and scheduled appt for 08/04/21 at 9:00AM. She mentioned that pt has dialysis on Thursday. [facility] address provided.

## 2021-08-04 ENCOUNTER — Ambulatory Visit: Payer: Medicare Other | Attending: Family Medicine | Admitting: Clinical

## 2021-08-04 ENCOUNTER — Other Ambulatory Visit: Payer: Self-pay

## 2021-08-04 DIAGNOSIS — F439 Reaction to severe stress, unspecified: Secondary | ICD-10-CM

## 2021-08-04 DIAGNOSIS — R41840 Attention and concentration deficit: Secondary | ICD-10-CM

## 2021-08-04 DIAGNOSIS — F4329 Adjustment disorder with other symptoms: Secondary | ICD-10-CM

## 2021-08-04 NOTE — BH Specialist Note (Signed)
Integrated Behavioral Health Initial In-Person Visit  MRN: [id number] Name: Douglas [name]  Number of Douglas [name] Clinician visits:: 1/6 Session Start time: 9:06 am  Session End time: 10:01 am Total time: 55  minutes  Types of Service: Individual psychotherapy  Interpretor:No. Interpretor Name and Language: N/A   Warm Hand Off Completed.         Subjective: Douglas [name] is a 35 y.o. male accompanied by  self Patient was referred by PCP Douglas [name] for depression. Patient reports the following symptoms/concerns: Pt reports feeling tired, racing thoughts, difficulty concentrating, irritability, and restlessness at times. Reports that he was diagnosed with a kidney illness one year ago and has been adjusting to this change. Reports that he was initially depressed when learning about the illness a year ago but has not felt depressed since last year. Reports that he experienced SI when learning about the diagnosis last year but has not experienced this since then. Reports difficulty adjusting to previous daily activities due to kidney illness. Reports being diagnosed with ADHD during childhood. Duration of problem: one year; Severity of problem: mild  Objective: Mood: Anxious and Affect: Appropriate Risk of harm to self or others: No plan to harm self or others  Life Context: Family and Social: Reports that he currently lives with his mother. Reports spending time with his sister and nieces/nephews in his free time. Reports that he recently met his father and is building their relationship. School/Work: Reports that he is unemployed and receives disability income. Self-Care: Reports tobacco use 2-3 times/week. Reports walking and spending time with family as coping skill. Reports that he used to draw in the past but has experienced difficulty concentrating since learning about his kidney disease. Life Changes: Reports that he has been adjusting since being  diagnosed with kidney disease. Reports that he experiences racing thoughts and difficulty concentrating since the diagnoses. Reports that he was diagnosed with ADHD during childhood and was taking Adderall and Ritalin in the past; he has seen symptoms resurface in the past year. Pt wants to be able to draw again. Pt also wants to be able to work and is frustrated due to being unable to work as a result of kidney dialysis.   Patient and/or Family's Strengths/Protective Factors: Social connections, Social and Emotional competence, Concrete supports in place (healthy food, safe environments, etc.), and Sense of purpose  Goals Addressed: Patient will: Reduce symptoms of: stress and racing thoughts Increase knowledge and/or ability of: coping skills and stress reduction  Demonstrate ability to: Increase healthy adjustment to current life circumstances  Progress towards Goals: Ongoing  Interventions: Interventions utilized: Motivational Interviewing, Mindfulness or Psychologist, educational, CBT Cognitive Behavioral Therapy, and Psychoeducation and/or Health Education  Standardized Assessments completed:  MDQ (Mood Disorder Questionnaire)3, ASRS, AUDIT, GAD-7, and PHQ 9  Patient and/or Family Response: Pt was receptive to tx. Pt will begin utilizing deep breathing exercises to decrease racing thoughts. Pt identified motivation for wanting to draw again as wanting to "tell stories". Pt will begin journaling motivation to begin drawing. Pt's symptoms were normalized due to physical health changes and was receptive to psychoeducation. Pt was uninterested in tobacco cessation.  Patient Centered Plan: Patient is on the following Treatment Plan(s):  stress  Assessment: Patient currently experiencing adjustment reaction to physical health changes. Pt appears to have racing thoughts daily since diagnosis of kidney disease and has lost his motivation for drawing. Pt appears to have enjoyed drawing in the past.  Drawing appeared to have improved pt's  mental health. Pt appears to have difficulty concentrating. Pt has significant support system and seems aware of his emotions.   Patient may benefit from restarting medication management. Pt would benefit journaling motivation for drawing with ongoing motivational interviewing. LCSWA will inform care coordinator for pt to be referred for psychiatry. LCSWA will fu with pt.   Plan: Follow up with behavioral health clinician on : 09/03/21 Behavioral recommendations: Utilize deep breathing exercises, begin journaling motivation for drawing, and continue daily walks. Referral(s): [facility] (In Clinic) and Psychiatrist "From scale of 1-10, how likely are you to follow plan?": 10  Douglas [name] C Douglas Louthan, LCSW

## 2021-08-06 ENCOUNTER — Telehealth: Payer: Self-pay

## 2021-08-06 NOTE — Telephone Encounter (Addendum)
[facility] is unable to accept the patients insurance. Verified on 08/06/21.    ----- Message from Ruffin Pyo, LCSW sent at 08/04/2021 10:41 AM EDT ----- He may be a good candidate for [facility] for psychiatry. He has a hx of ADHD and is seeing those symptoms rise again.

## 2021-08-11 ENCOUNTER — Telehealth: Payer: Self-pay

## 2021-08-11 ENCOUNTER — Ambulatory Visit: Payer: Medicare Other | Admitting: Surgery

## 2021-08-11 NOTE — Telephone Encounter (Signed)
Intake form for counseling sent to [facility].

## 2021-08-20 ENCOUNTER — Other Ambulatory Visit (INDEPENDENT_AMBULATORY_CARE_PROVIDER_SITE_OTHER): Payer: Self-pay | Admitting: Primary Care

## 2021-08-20 NOTE — Telephone Encounter (Signed)
Sent to PCP to refill if appropriate.  

## 2021-08-21 ENCOUNTER — Other Ambulatory Visit: Payer: Self-pay

## 2021-08-21 MED ORDER — CLONIDINE HCL 0.3 MG PO TABS
ORAL_TABLET | ORAL | 0 refills | Status: DC
  Filled 2021-08-21: qty 90, 30d supply, fill #0

## 2021-08-22 ENCOUNTER — Other Ambulatory Visit: Payer: Self-pay

## 2021-08-26 ENCOUNTER — Other Ambulatory Visit: Payer: Self-pay

## 2021-08-26 MED ORDER — AMLODIPINE BESYLATE 10 MG PO TABS
10.0000 mg | ORAL_TABLET | Freq: Every day | ORAL | 3 refills | Status: DC
  Filled 2021-08-26: qty 90, 90d supply, fill #0

## 2021-09-03 ENCOUNTER — Ambulatory Visit: Payer: Medicare Other | Admitting: Clinical

## 2021-09-08 ENCOUNTER — Other Ambulatory Visit: Payer: Self-pay

## 2021-09-08 ENCOUNTER — Ambulatory Visit (INDEPENDENT_AMBULATORY_CARE_PROVIDER_SITE_OTHER): Payer: Medicare Other | Admitting: Surgery

## 2021-09-08 ENCOUNTER — Encounter: Payer: Self-pay | Admitting: Surgery

## 2021-09-08 VITALS — BP 174/116 | HR 64 | Temp 98.7°F | Resp 20 | Ht 77.0 in | Wt 231.0 lb

## 2021-09-08 DIAGNOSIS — Z992 Dependence on renal dialysis: Secondary | ICD-10-CM

## 2021-09-08 DIAGNOSIS — N186 End stage renal disease: Secondary | ICD-10-CM
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
## 2021-09-17 ENCOUNTER — Other Ambulatory Visit: Payer: Self-pay

## 2021-09-17 ENCOUNTER — Encounter (HOSPITAL_COMMUNITY): Payer: Self-pay | Admitting: Surgery

## 2021-09-17 ENCOUNTER — Ambulatory Visit (HOSPITAL_COMMUNITY): Payer: Medicare Other | Admitting: Certified Registered Nurse Anesthetist

## 2021-09-17 ENCOUNTER — Ambulatory Visit: Payer: Medicare Other | Admitting: Clinical

## 2021-09-17 ENCOUNTER — Ambulatory Visit (HOSPITAL_COMMUNITY)
Admission: RE | Admit: 2021-09-17 | Discharge: 2021-09-17 | Disposition: A | Discharge status: Home / Self Care | Payer: Medicare Other | Source: Other Acute Inpatient Hospital | Attending: Surgery | Admitting: Surgery

## 2021-09-17 ENCOUNTER — Encounter (HOSPITAL_COMMUNITY): Admission: RE | Disposition: A | Discharge status: Home / Self Care | Payer: Self-pay | Attending: Surgery

## 2021-09-17 DIAGNOSIS — Z91018 Allergy to other foods: Secondary | ICD-10-CM | POA: Insufficient documentation

## 2021-09-17 DIAGNOSIS — Z992 Dependence on renal dialysis: Secondary | ICD-10-CM | POA: Diagnosis not present

## 2021-09-17 DIAGNOSIS — Z79899 Other long term (current) drug therapy: Secondary | ICD-10-CM | POA: Diagnosis not present

## 2021-09-17 DIAGNOSIS — J449 Chronic obstructive pulmonary disease, unspecified: Secondary | ICD-10-CM | POA: Diagnosis not present

## 2021-09-17 DIAGNOSIS — N186 End stage renal disease: Secondary | ICD-10-CM | POA: Insufficient documentation

## 2021-09-17 DIAGNOSIS — I120 Hypertensive chronic kidney disease with stage 5 chronic kidney disease or end stage renal disease: Secondary | ICD-10-CM | POA: Insufficient documentation

## 2021-09-17 DIAGNOSIS — F17210 Nicotine dependence, cigarettes, uncomplicated: Secondary | ICD-10-CM | POA: Insufficient documentation

## 2021-09-17 HISTORY — PX: BASCILIC VEIN TRANSPOSITION: SHX5742

## 2021-09-17 LAB — POCT I-STAT, CHEM 8
BUN: 36 mg/dL — ABNORMAL HIGH (ref 6–20)
Calcium, Ion: 1.17 mmol/L (ref 1.15–1.40)
Chloride: 95 mmol/L — ABNORMAL LOW (ref 98–111)
Creatinine, Ser: 13.1 mg/dL — ABNORMAL HIGH (ref 0.61–1.24)
Glucose, Bld: 95 mg/dL (ref 70–99)
HCT: 29 % — ABNORMAL LOW (ref 39.0–52.0)
Hemoglobin: 9.9 g/dL — ABNORMAL LOW (ref 13.0–17.0)
Potassium: 4.8 mmol/L (ref 3.5–5.1)
Sodium: 135 mmol/L (ref 135–145)
TCO2: 30 mmol/L (ref 22–32)

## 2021-09-17 SURGERY — TRANSPOSITION, VEIN, BASILIC
Anesthesia: General | Site: Arm Upper | Laterality: Right

## 2021-09-17 MED ORDER — HEPARIN 6000 UNIT IRRIGATION SOLUTION
Status: AC
  Filled 2021-09-17: qty 500

## 2021-09-17 MED ORDER — CHLORHEXIDINE GLUCONATE 4 % EX LIQD: 60.0000 mL | Freq: Once | CUTANEOUS | Status: DC

## 2021-09-17 MED ORDER — BUPIVACAINE HCL 0.5 % IJ SOLN
INTRAMUSCULAR | Status: DC | PRN
  Administered 2021-09-17: 30 mL

## 2021-09-17 MED ORDER — SODIUM CHLORIDE 0.9 % IV SOLN: INTRAVENOUS | Status: DC

## 2021-09-17 MED ORDER — LABETALOL HCL 5 MG/ML IV SOLN
5.0000 mg | Freq: Once | INTRAVENOUS | Status: AC
  Administered 2021-09-17 (×2): 5 mg via INTRAVENOUS
  Filled 2021-09-17: qty 4

## 2021-09-17 MED ORDER — LABETALOL HCL 5 MG/ML IV SOLN
INTRAVENOUS | Status: AC
  Filled 2021-09-17: qty 4

## 2021-09-17 MED ORDER — BUPIVACAINE HCL (PF) 0.5 % IJ SOLN
INTRAMUSCULAR | Status: AC
  Filled 2021-09-17: qty 30

## 2021-09-17 MED ORDER — HEPARIN 6000 UNIT IRRIGATION SOLUTION
Status: DC | PRN
  Administered 2021-09-17: 1

## 2021-09-17 MED ORDER — FENTANYL CITRATE (PF) 100 MCG/2ML IJ SOLN
INTRAMUSCULAR | Status: DC | PRN
  Administered 2021-09-17: 50 ug via INTRAVENOUS
  Administered 2021-09-17 (×2): 25 ug via INTRAVENOUS
  Administered 2021-09-17: 50 ug via INTRAVENOUS

## 2021-09-17 MED ORDER — PROPOFOL 10 MG/ML IV BOLUS
INTRAVENOUS | Status: DC | PRN
  Administered 2021-09-17: 25 mg via INTRAVENOUS
  Administered 2021-09-17: 300 mg via INTRAVENOUS
  Administered 2021-09-17 (×3): 25 mg via INTRAVENOUS

## 2021-09-17 MED ORDER — ONDANSETRON HCL 4 MG/2ML IJ SOLN
INTRAMUSCULAR | Status: DC | PRN
  Administered 2021-09-17: 4 mg via INTRAVENOUS

## 2021-09-17 MED ORDER — LIDOCAINE-EPINEPHRINE (PF) 1 %-1:200000 IJ SOLN
INTRAMUSCULAR | Status: AC
  Filled 2021-09-17: qty 30

## 2021-09-17 MED ORDER — OXYCODONE-ACETAMINOPHEN 5-325 MG PO TABS: 1.0000 | ORAL_TABLET | Freq: Four times a day (QID) | ORAL | 0 refills | Status: DC | PRN

## 2021-09-17 MED ORDER — CHLORHEXIDINE GLUCONATE 0.12 % MT SOLN
15.0000 mL | Freq: Once | OROMUCOSAL | Status: AC
  Administered 2021-09-17: 15 mL via OROMUCOSAL
  Filled 2021-09-17: qty 15

## 2021-09-17 MED ORDER — HEMOSTATIC AGENTS (NO CHARGE) OPTIME
TOPICAL | Status: DC | PRN
  Administered 2021-09-17: 1 via TOPICAL

## 2021-09-17 MED ORDER — BUPIVACAINE LIPOSOME 1.3 % IJ SUSP
INTRAMUSCULAR | Status: DC | PRN
  Administered 2021-09-17: 20 mL

## 2021-09-17 MED ORDER — ACETAMINOPHEN 160 MG/5ML PO SOLN: 325.0000 mg | ORAL | Status: DC | PRN

## 2021-09-17 MED ORDER — LIDOCAINE HCL (CARDIAC) PF 100 MG/5ML IV SOSY
PREFILLED_SYRINGE | INTRAVENOUS | Status: DC | PRN
  Administered 2021-09-17: 60 mg via INTRAVENOUS

## 2021-09-17 MED ORDER — FENTANYL CITRATE (PF) 100 MCG/2ML IJ SOLN: 25.0000 ug | INTRAMUSCULAR | Status: DC | PRN

## 2021-09-17 MED ORDER — ACETAMINOPHEN 325 MG PO TABS: 325.0000 mg | ORAL_TABLET | ORAL | Status: DC | PRN

## 2021-09-17 MED ORDER — ORAL CARE MOUTH RINSE: 15.0000 mL | Freq: Once | OROMUCOSAL | Status: AC

## 2021-09-17 MED ORDER — MIDAZOLAM HCL 2 MG/2ML IJ SOLN
INTRAMUSCULAR | Status: AC
  Filled 2021-09-17: qty 2

## 2021-09-17 MED ORDER — AMISULPRIDE (ANTIEMETIC) 5 MG/2ML IV SOLN: 10.0000 mg | Freq: Once | INTRAVENOUS | Status: DC | PRN

## 2021-09-17 MED ORDER — HYDRALAZINE HCL 20 MG/ML IJ SOLN
INTRAMUSCULAR | Status: AC
  Filled 2021-09-17: qty 1

## 2021-09-17 MED ORDER — OXYCODONE HCL 5 MG PO TABS: 5.0000 mg | ORAL_TABLET | Freq: Once | ORAL | Status: DC | PRN

## 2021-09-17 MED ORDER — HYDRALAZINE HCL 20 MG/ML IJ SOLN
5.0000 mg | Freq: Three times a day (TID) | INTRAMUSCULAR | Status: DC | PRN
  Administered 2021-09-17 (×3): 5 mg via INTRAVENOUS

## 2021-09-17 MED ORDER — 0.9 % SODIUM CHLORIDE (POUR BTL) OPTIME
TOPICAL | Status: DC | PRN
  Administered 2021-09-17: 1000 mL

## 2021-09-17 MED ORDER — CEFAZOLIN SODIUM-DEXTROSE 2-4 GM/100ML-% IV SOLN
2.0000 g | INTRAVENOUS | Status: AC
  Administered 2021-09-17: 2 g via INTRAVENOUS
  Filled 2021-09-17: qty 100

## 2021-09-17 MED ORDER — OXYCODONE HCL 5 MG/5ML PO SOLN: 5.0000 mg | Freq: Once | ORAL | Status: DC | PRN

## 2021-09-17 MED ORDER — BUPIVACAINE LIPOSOME 1.3 % IJ SUSP
INTRAMUSCULAR | Status: AC
  Filled 2021-09-17: qty 20

## 2021-09-17 MED ORDER — LABETALOL HCL 5 MG/ML IV SOLN
5.0000 mg | INTRAVENOUS | Status: AC | PRN
  Administered 2021-09-17 (×2): 5 mg via INTRAVENOUS

## 2021-09-17 MED ORDER — MIDAZOLAM HCL 5 MG/5ML IJ SOLN
INTRAMUSCULAR | Status: DC | PRN
  Administered 2021-09-17: 1 mg via INTRAVENOUS

## 2021-09-17 MED ORDER — ACETAMINOPHEN 10 MG/ML IV SOLN: 1000.0000 mg | Freq: Once | INTRAVENOUS | Status: DC | PRN

## 2021-09-17 MED ORDER — PROPOFOL 10 MG/ML IV BOLUS
INTRAVENOUS | Status: AC
  Filled 2021-09-17: qty 20

## 2021-09-17 MED ORDER — HYDRALAZINE HCL 20 MG/ML IJ SOLN
5.0000 mg | Freq: Three times a day (TID) | INTRAMUSCULAR | Status: AC | PRN
  Administered 2021-09-17: 5 mg via INTRAVENOUS

## 2021-09-17 MED ORDER — FENTANYL CITRATE (PF) 250 MCG/5ML IJ SOLN
INTRAMUSCULAR | Status: AC
  Filled 2021-09-17: qty 5

## 2021-09-17 MED ORDER — DEXAMETHASONE SODIUM PHOSPHATE 4 MG/ML IJ SOLN
INTRAMUSCULAR | Status: DC | PRN
  Administered 2021-09-17: 5 mg via INTRAVENOUS

## 2021-09-17 SURGICAL SUPPLY — 49 items
ADH SKN CLS APL DERMABOND .7 (GAUZE/BANDAGES/DRESSINGS) ×1
AGENT HMST KT MTR STRL THRMB (HEMOSTASIS) ×1
ARMBAND PINK RESTRICT EXTREMIT (MISCELLANEOUS) ×2 IMPLANT
BAG COUNTER SPONGE SURGICOUNT (BAG) ×2 IMPLANT
BAG SPNG CNTER NS LX DISP (BAG) ×1
BNDG CMPR MED 15X6 ELC VLCR LF (GAUZE/BANDAGES/DRESSINGS) ×1
BNDG ELASTIC 4X5.8 VLCR STR LF (GAUZE/BANDAGES/DRESSINGS) ×1 IMPLANT
BNDG ELASTIC 6X15 VLCR STRL LF (GAUZE/BANDAGES/DRESSINGS) ×1 IMPLANT
CANISTER SUCT 3000ML PPV (MISCELLANEOUS) ×2 IMPLANT
CLIP VESOCCLUDE MED 24/CT (CLIP) ×1 IMPLANT
CLIP VESOCCLUDE MED 6/CT (CLIP) IMPLANT
CLIP VESOCCLUDE SM WIDE 24/CT (CLIP) ×1 IMPLANT
CLIP VESOCCLUDE SM WIDE 6/CT (CLIP) IMPLANT
COVER PROBE W GEL 5X96 (DRAPES) ×2 IMPLANT
DERMABOND ADVANCED (GAUZE/BANDAGES/DRESSINGS) ×1
DERMABOND ADVANCED .7 DNX12 (GAUZE/BANDAGES/DRESSINGS) ×1 IMPLANT
ELECT REM PT RETURN 9FT ADLT (ELECTROSURGICAL) ×2
ELECTRODE REM PT RTRN 9FT ADLT (ELECTROSURGICAL) ×1 IMPLANT
GAUZE 4X4 16PLY ~~LOC~~+RFID DBL (SPONGE) ×1 IMPLANT
GLOVE SRG 8 PF TXTR STRL LF DI (GLOVE) ×1 IMPLANT
GLOVE SURG POLYISO LF SZ7.5 (GLOVE) ×2 IMPLANT
GLOVE SURG UNDER POLY LF SZ8 (GLOVE) ×2
GOWN STRL REUS W/ TWL LRG LVL3 (GOWN DISPOSABLE) ×2 IMPLANT
GOWN STRL REUS W/ TWL XL LVL3 (GOWN DISPOSABLE) ×1 IMPLANT
GOWN STRL REUS W/TWL LRG LVL3 (GOWN DISPOSABLE) ×4
GOWN STRL REUS W/TWL XL LVL3 (GOWN DISPOSABLE) ×2
HEMOSTAT SNOW SURGICEL 2X4 (HEMOSTASIS) IMPLANT
KIT BASIN OR (CUSTOM PROCEDURE TRAY) ×2 IMPLANT
KIT TURNOVER KIT B (KITS) ×2 IMPLANT
MARKER SKIN DUAL TIP RULER LAB (MISCELLANEOUS) ×1 IMPLANT
NDL 18GX1X1/2 (RX/OR ONLY) (NEEDLE) ×1 IMPLANT
NEEDLE 18GX1X1/2 (RX/OR ONLY) (NEEDLE) ×4 IMPLANT
NS IRRIG 1000ML POUR BTL (IV SOLUTION) ×2 IMPLANT
PACK CV ACCESS (CUSTOM PROCEDURE TRAY) ×2 IMPLANT
PAD ARMBOARD 7.5X6 YLW CONV (MISCELLANEOUS) ×4 IMPLANT
PENCIL SMOKE EVACUATOR (MISCELLANEOUS) ×1 IMPLANT
SPONGE T-LAP 18X18 ~~LOC~~+RFID (SPONGE) ×3 IMPLANT
SURGIFLO W/THROMBIN 8M KIT (HEMOSTASIS) ×1 IMPLANT
SUT PROLENE 5 0 C 1 24 (SUTURE) ×2 IMPLANT
SUT PROLENE 6 0 CC (SUTURE) ×5 IMPLANT
SUT SILK 2 0 SH (SUTURE) ×1 IMPLANT
SUT VIC AB 3-0 SH 27 (SUTURE) ×4
SUT VIC AB 3-0 SH 27X BRD (SUTURE) ×1 IMPLANT
SUT VIC AB 4-0 PS2 18 (SUTURE) ×2 IMPLANT
SUT VICRYL 4-0 PS2 18IN ABS (SUTURE) ×2 IMPLANT
SYR 30ML LL (SYRINGE) ×3 IMPLANT
TOWEL GREEN STERILE (TOWEL DISPOSABLE) ×2 IMPLANT
UNDERPAD 30X36 HEAVY ABSORB (UNDERPADS AND DIAPERS) ×2 IMPLANT
WATER STERILE IRR 1000ML POUR (IV SOLUTION) ×2 IMPLANT

## 2021-09-17 NOTE — Interval H&P Note (Signed)
History and Physical Interval Note:  09/17/2021 11:34 AM  Douglas [name]  has presented today for surgery, with the diagnosis of ESRD.  The various methods of treatment have been discussed with the patient and family. After consideration of risks, benefits and other options for treatment, the patient has consented to  Procedure(s): SECOND STAGE RIGHT BASILIC VEIN TRANSPOSITION (Right) as a surgical intervention.  The patient's history has been reviewed, patient examined, no change in status, stable for surgery.  I have reviewed the patient's chart and labs.  Questions were answered to the patient's satisfaction.     [name]

## 2021-09-17 NOTE — Discharge Instructions (Signed)
   [facility]  Discharge Instructions  AV Fistula or Graft Surgery for Dialysis Access  Please refer to the following instructions for your post-procedure care. Your surgeon or physician assistant will discuss any changes with you.  Activity  You may drive the day following your surgery, if you are comfortable and no longer taking prescription pain medication. Resume full activity as the soreness in your incision resolves.  Bathing/Showering  You may shower after you go home. Keep your incision dry for 48 hours. Do not soak in a bathtub, hot tub, or swim until the incision heals completely. You may not shower if you have a hemodialysis catheter.  Incision Care  Clean your incision with mild soap and water after 48 hours. Pat the area dry with a clean towel. You do not need a bandage unless otherwise instructed. Do not apply any ointments or creams to your incision. You may have skin glue on your incision. Do not peel it off. It will come off on its own in about one week. Your arm may swell a bit after surgery. To reduce swelling use pillows to elevate your arm so it is above your heart. Your doctor will tell you if you need to lightly wrap your arm with an ACE bandage.  Diet  Resume your normal diet. There are not special food restrictions following this procedure. In order to heal from your surgery, it is CRITICAL to get adequate nutrition. Your body requires vitamins, minerals, and protein. Vegetables are the best source of vitamins and minerals. Vegetables also provide the perfect balance of protein. Processed food has little nutritional value, so try to avoid this.  Medications  Resume taking all of your medications. If your incision is causing pain, you may take over-the counter pain relievers such as acetaminophen (Tylenol). If you were prescribed a stronger pain medication, please be aware these medications can cause nausea and constipation. Prevent  nausea by taking the medication with a snack or meal. Avoid constipation by drinking plenty of fluids and eating foods with high amount of fiber, such as fruits, vegetables, and grains.  Do not take Tylenol if you are taking prescription pain medications.  Follow up Your surgeon may want to see you in the office following your access surgery. If so, this will be arranged at the time of your surgery.  Please call us immediately for any of the following conditions:  Increased pain, redness, drainage (pus) from your incision site Fever of 101 degrees or higher Severe or worsening pain at your incision site Hand pain or numbness.  Reduce your risk of vascular disease:  Stop smoking. If you would like help, call [facility] at 1-800-QUIT-NOW 831-500-0219) or [place] at [place] your cholesterol Maintain a desired weight Control your diabetes Keep your blood pressure down  Dialysis  It will take several weeks to several months for your new dialysis access to be ready for use. Your surgeon will determine when it is okay to use it. Your nephrologist will continue to direct your dialysis. You can continue to use your Permcath until your new access is ready for use.   09/17/2021 Douglas [name] [id number] 1986-10-31  Surgeon(s): Serafina Mitchell, MD  Procedure(s): SECOND STAGE RIGHT BASILIC VEIN TRANSPOSITION  x Do not stick fistula for 4 weeks    If you have any questions, please call the office at [phone].

## 2021-09-17 NOTE — Op Note (Signed)
    Patient name: Melbern Cefalo MRN: [id number] DOB: 1986/05/07 Sex: male  09/17/2021 Pre-operative Diagnosis: End-stage renal disease Post-operative diagnosis:  Same Surgeon:  [name] Assistants: Arlee Muslim Procedure:   Revision of right basilic vein fistula (second stage basilic vein transposition) Anesthesia: General Blood Loss: 100 cc Specimens: None  Findings: 1 cm basilic vein  Indications: This is a 35 year old gentleman now on dialysis via a right sided catheter who comes in today for his second stage basilic vein fistula.  Procedure:  The patient was identified in the holding area and taken to [place] [address]  The patient was then placed supine on the table. general anesthesia was administered.  The patient was prepped and draped in the usual sterile fashion.  A time out was called and antibiotics were administered.  A PA was necessary to assist with the procedure and assist with technical details.  Ultrasound was used to map the course of the basilic vein in the upper arm.  I made to longitudinal incisions in the upper arm making sure to avoid the patient's tattoo.  Through these incisions the basilic vein was circumferentially mobilized.  I dissected out the vein underneath the skin bridge.  Several large branches required ligation between silk ties.  Ultimately the vein was skeletonized from the antecubital crease up to the axilla.  It was marked for orientation.  I then occluded the fistula proximally and distally with baby Gregory clamps and then transected the basilic vein at the antecubital crease.  The vein distended nicely and was uniform in diameter, approximately 1 cm throughout.  Next, Exparel was infiltrated into the arm or I was going to create the tunnel.  Next a curved Gore tunneler was used to create the tunnel.  A uterine dressing clamp was passed through the tunnel and grabbed the vein in the axilla and brought through the tunnel to the antecubital incision  making sure to maintain proper orientation.  I then performed a end-to-end anastomosis with running 5-0 Prolene.  After the anastomosis was completed, the clamps were released.  Blood flow was reestablished to the fistula.  There was an excellent thrill.  He continued to have a palpable radial pulse.  The wound was then irrigated.  Hemostasis was achieved.  The 2 incisions were closed with a deep layer 3-0 Vicryl followed by subcuticular stitch.  Dermabond was applied.  The arm was wrapped in an Ace bandage.   Disposition: To PACU stable.   Theotis Burrow, M.D., [facility] [place] Office: [phone] Pager:  [phone]

## 2021-09-17 NOTE — Anesthesia Procedure Notes (Signed)
Procedure Name: LMA Insertion Date/Time: 09/17/2021 11:53 AM Performed by: Michele Rockers, CRNA Pre-anesthesia Checklist: Patient identified, Patient being monitored, Timeout performed, Emergency Drugs available and Suction available Patient Re-evaluated:Patient Re-evaluated prior to induction Oxygen Delivery Method: Circle system utilized Preoxygenation: Pre-oxygenation with 100% oxygen Induction Type: IV induction Ventilation: Mask ventilation without difficulty LMA Size: 5.0 Placement Confirmation: ETT inserted through vocal cords under direct vision, positive ETCO2 and breath sounds checked- equal and bilateral Tube secured with: Tape Dental Injury: Teeth and Oropharynx as per pre-operative assessment

## 2021-09-17 NOTE — Progress Notes (Signed)
Pt's BP's 183/115 and 179/118. MD [facility] notified; verbal order for '5mg'$  of Labetalol IV.

## 2021-09-17 NOTE — Transfer of Care (Signed)
Immediate Anesthesia Transfer of Care Note  Patient: Douglas [name]  Procedure(s) Performed: SECOND STAGE RIGHT BASILIC VEIN TRANSPOSITION (Right: Arm Upper)  Patient Location: PACU  Anesthesia Type:General  Level of Consciousness: drowsy, patient cooperative and responds to stimulation  Airway & Oxygen Therapy: Patient Spontanous Breathing and Patient connected to face mask oxygen  Post-op Assessment: Report given to RN and Post -op Vital signs reviewed and stable  Post vital signs: Reviewed and stable  Last Vitals:  Vitals Value Taken Time  BP    Temp    Pulse 74 09/17/21 1414  Resp 18 09/17/21 1414  SpO2 98 % 09/17/21 1414  Vitals shown include unvalidated device data.  Last Pain:  Vitals:   09/17/21 0944  TempSrc:   PainSc: 0-No pain         Complications: No notable events documented.

## 2021-09-17 NOTE — Anesthesia Preprocedure Evaluation (Addendum)
Anesthesia Evaluation  Patient identified by MRN, date of birth, ID band Patient awake    Reviewed: Allergy & Precautions, NPO status , Patient's Chart, lab work & pertinent test results  Airway Mallampati: I  TM Distance: >3 FB Neck ROM: Full    Dental  (+) Poor Dentition, Missing, Chipped, Dental Advisory Given   Pulmonary asthma , sleep apnea , COPD, Current Smoker,     + decreased breath sounds      Cardiovascular hypertension, Pt. on medications and Pt. on home beta blockers + Peripheral Vascular Disease   Rhythm:Regular Rate:Normal     Neuro/Psych  Headaches, PSYCHIATRIC DISORDERS Anxiety Depression    GI/Hepatic negative GI ROS, Neg liver ROS,   Endo/Other  negative endocrine ROS  Renal/GU CRF and ESRFRenal disease     Musculoskeletal   Abdominal Normal abdominal exam  (+)   Peds  Hematology negative hematology ROS (+)   Anesthesia Other Findings Pt drowsy in short stay  Reproductive/Obstetrics                            Anesthesia Physical Anesthesia Plan  ASA: 3  Anesthesia Plan: General   Post-op Pain Management:    Induction: Intravenous  PONV Risk Score and Plan: 2 and Ondansetron and Midazolam  Airway Management Planned: LMA  Additional Equipment: None  Intra-op Plan:   Post-operative Plan: Extubation in OR  Informed Consent: I have reviewed the patients History and Physical, chart, labs and discussed the procedure including the risks, benefits and alternatives for the proposed anesthesia with the patient or authorized representative who has indicated his/her understanding and acceptance.     Dental advisory given  Plan Discussed with: CRNA  Anesthesia Plan Comments: (Pt declined block. )       Anesthesia Quick Evaluation

## 2021-09-18 ENCOUNTER — Encounter (HOSPITAL_COMMUNITY): Payer: Self-pay | Admitting: Surgery

## 2021-09-18 NOTE — Anesthesia Postprocedure Evaluation (Signed)
Anesthesia Post Note  Patient: Douglas [name]  Procedure(s) Performed: SECOND STAGE RIGHT BASILIC VEIN TRANSPOSITION (Right: Arm Upper)     Patient location during evaluation: PACU Anesthesia Type: General Level of consciousness: awake and alert Pain management: pain level controlled Vital Signs Assessment: post-procedure vital signs reviewed and stable Respiratory status: spontaneous breathing, nonlabored ventilation, respiratory function stable and patient connected to nasal cannula oxygen Cardiovascular status: blood pressure returned to baseline and stable Postop Assessment: no apparent nausea or vomiting Anesthetic complications: no   No notable events documented.              Effie Berkshire

## 2021-09-22 ENCOUNTER — Other Ambulatory Visit (INDEPENDENT_AMBULATORY_CARE_PROVIDER_SITE_OTHER): Payer: Self-pay | Admitting: Primary Care

## 2021-09-23 ENCOUNTER — Other Ambulatory Visit: Payer: Self-pay

## 2021-09-23 MED ORDER — CLONIDINE HCL 0.3 MG PO TABS
ORAL_TABLET | ORAL | 1 refills | Status: DC
  Filled 2021-09-23: qty 90, 30d supply, fill #0
  Filled 2021-10-30: qty 90, 30d supply, fill #1

## 2021-09-26 ENCOUNTER — Other Ambulatory Visit: Payer: Self-pay

## 2021-09-29 ENCOUNTER — Encounter (HOSPITAL_COMMUNITY): Payer: Self-pay

## 2021-09-29 ENCOUNTER — Emergency Department (HOSPITAL_COMMUNITY): Payer: Medicare Other

## 2021-09-29 ENCOUNTER — Ambulatory Visit (INDEPENDENT_AMBULATORY_CARE_PROVIDER_SITE_OTHER): Payer: Self-pay | Admitting: *Deleted

## 2021-09-29 ENCOUNTER — Ambulatory Visit (INDEPENDENT_AMBULATORY_CARE_PROVIDER_SITE_OTHER): Payer: Self-pay | Admitting: Physician Assistant

## 2021-09-29 ENCOUNTER — Other Ambulatory Visit: Payer: Self-pay

## 2021-09-29 ENCOUNTER — Emergency Department (HOSPITAL_COMMUNITY)
Admission: EM | Admit: 2021-09-29 | Discharge: 2021-09-30 | Disposition: A | Discharge status: Home / Self Care | Payer: Medicare Other | Attending: Emergency Medicine | Admitting: Emergency Medicine

## 2021-09-29 ENCOUNTER — Telehealth: Payer: Self-pay

## 2021-09-29 DIAGNOSIS — R112 Nausea with vomiting, unspecified: Secondary | ICD-10-CM | POA: Insufficient documentation

## 2021-09-29 DIAGNOSIS — I120 Hypertensive chronic kidney disease with stage 5 chronic kidney disease or end stage renal disease: Secondary | ICD-10-CM | POA: Insufficient documentation

## 2021-09-29 DIAGNOSIS — Z992 Dependence on renal dialysis: Secondary | ICD-10-CM

## 2021-09-29 DIAGNOSIS — F17210 Nicotine dependence, cigarettes, uncomplicated: Secondary | ICD-10-CM | POA: Diagnosis not present

## 2021-09-29 DIAGNOSIS — N186 End stage renal disease: Secondary | ICD-10-CM | POA: Diagnosis not present

## 2021-09-29 DIAGNOSIS — Z79899 Other long term (current) drug therapy: Secondary | ICD-10-CM | POA: Diagnosis not present

## 2021-09-29 DIAGNOSIS — J449 Chronic obstructive pulmonary disease, unspecified: Secondary | ICD-10-CM | POA: Insufficient documentation

## 2021-09-29 DIAGNOSIS — J45909 Unspecified asthma, uncomplicated: Secondary | ICD-10-CM | POA: Insufficient documentation

## 2021-09-29 DIAGNOSIS — I10 Essential (primary) hypertension: Secondary | ICD-10-CM

## 2021-09-29 DIAGNOSIS — R1030 Lower abdominal pain, unspecified: Secondary | ICD-10-CM | POA: Insufficient documentation

## 2021-09-29 LAB — I-STAT CHEM 8, ED
BUN: 46 mg/dL — ABNORMAL HIGH (ref 6–20)
Calcium, Ion: 1.21 mmol/L (ref 1.15–1.40)
Chloride: 101 mmol/L (ref 98–111)
Creatinine, Ser: 17.5 mg/dL — ABNORMAL HIGH (ref 0.61–1.24)
Glucose, Bld: 103 mg/dL — ABNORMAL HIGH (ref 70–99)
HCT: 30 % — ABNORMAL LOW (ref 39.0–52.0)
Hemoglobin: 10.2 g/dL — ABNORMAL LOW (ref 13.0–17.0)
Potassium: 4.4 mmol/L (ref 3.5–5.1)
Sodium: 136 mmol/L (ref 135–145)
TCO2: 25 mmol/L (ref 22–32)

## 2021-09-29 IMAGING — CT CT ABD-PELV W/O CM
2 of 4 series · 17 of 46 positions shown, 19 images · non-contrast
Comparison: None.

CLINICAL DATA: Abdominal distension with nausea and vomiting.

EXAM:
CT ABDOMEN AND PELVIS WITHOUT CONTRAST
TECHNIQUE: Multidetector CT imaging of the abdomen and pelvis was performed
following the standard protocol without IV contrast.

[Series 3: a/p w/o 5mm · axial · non-contrast · 0.94mm/px · z∈[+19,+514]mm · 14 of 109 slices shown, 16 images]
[im 5/109  soft-tissue]
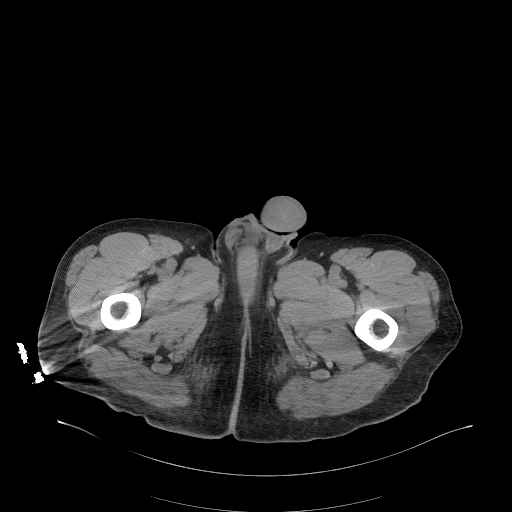
[im 5/109  bone]
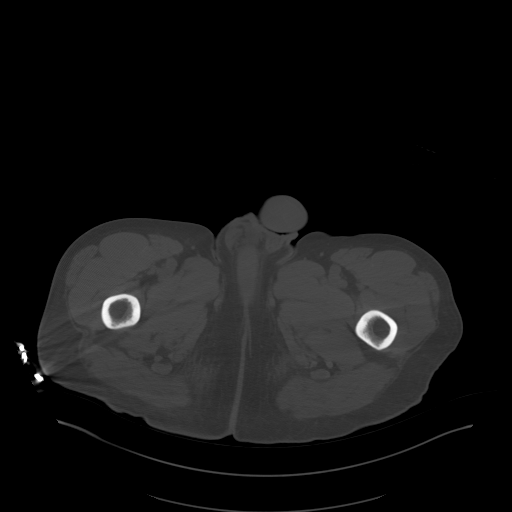
[im 14/109  soft-tissue]
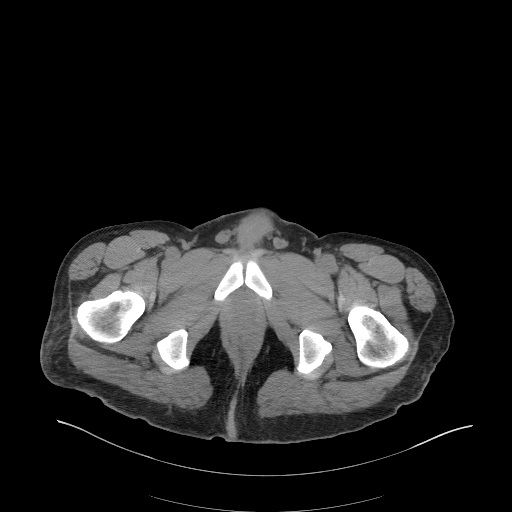
[im 23/109  soft-tissue]
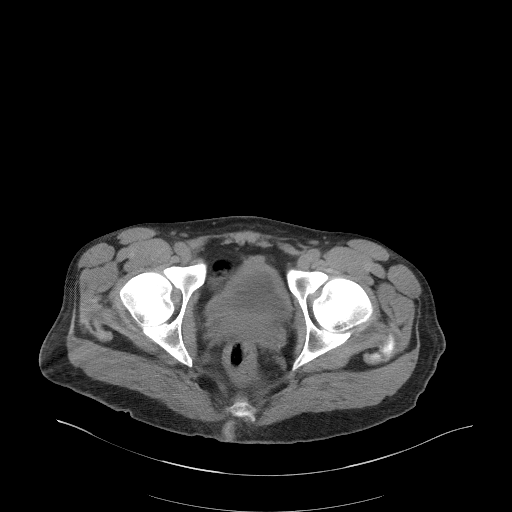
[im 28/109  soft-tissue]
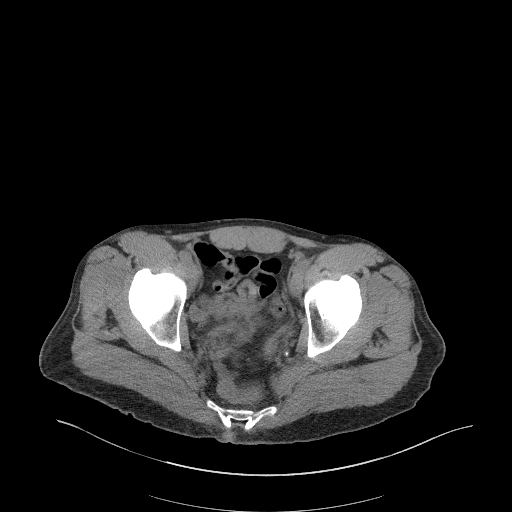
[im 37/109  soft-tissue]
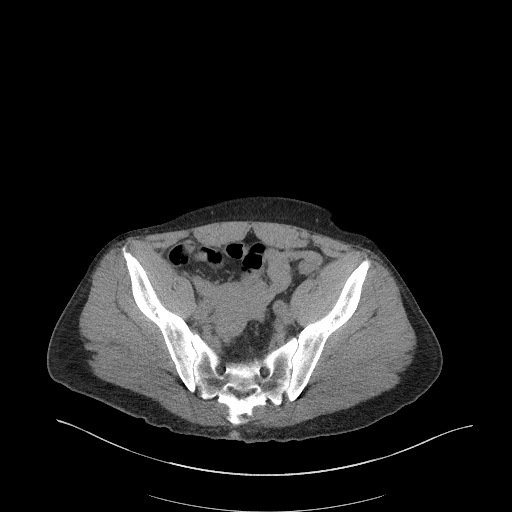
[im 46/109  soft-tissue]
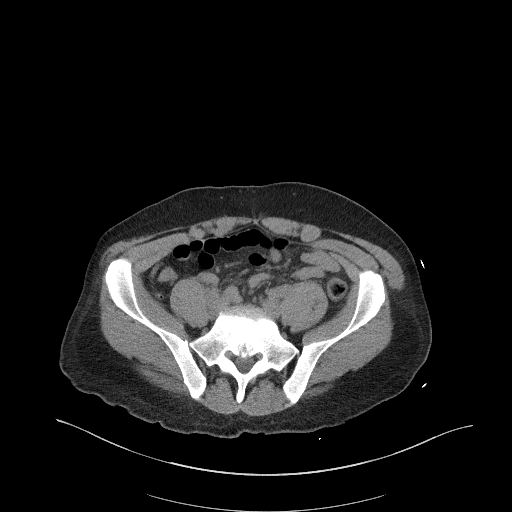
[im 50/109  soft-tissue]
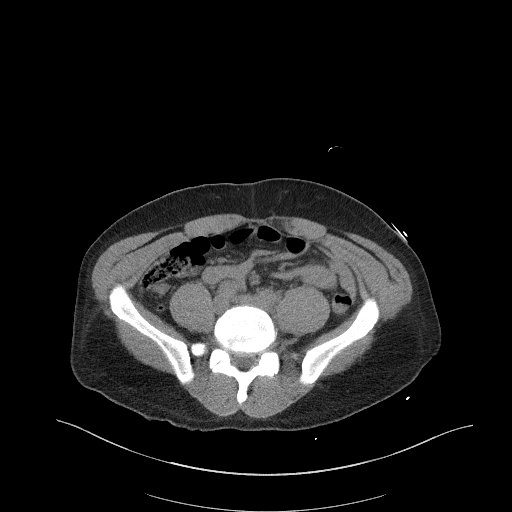
[im 59/109  soft-tissue]
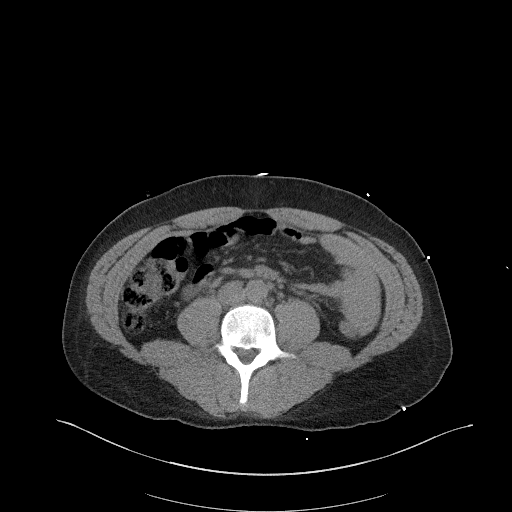
[im 64/109  soft-tissue]
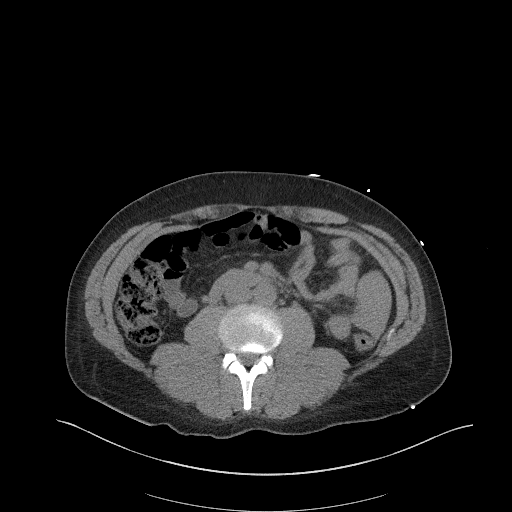
[im 64/109  bone]
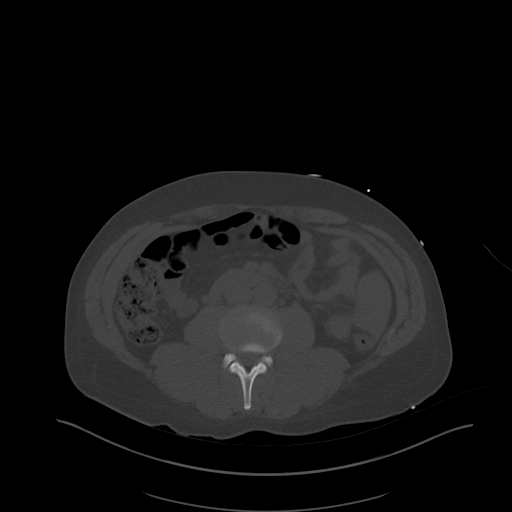
[im 73/109  soft-tissue]
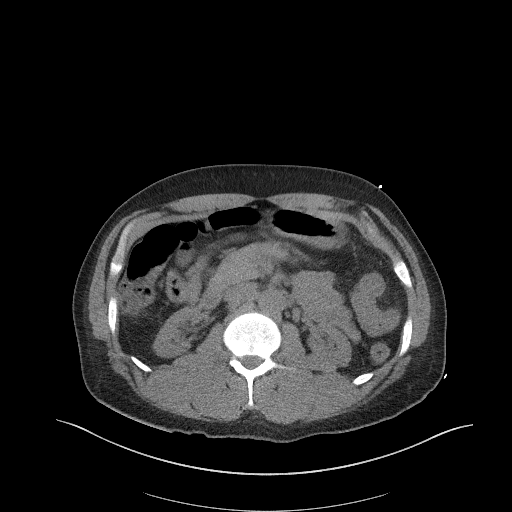
[im 82/109  soft-tissue]
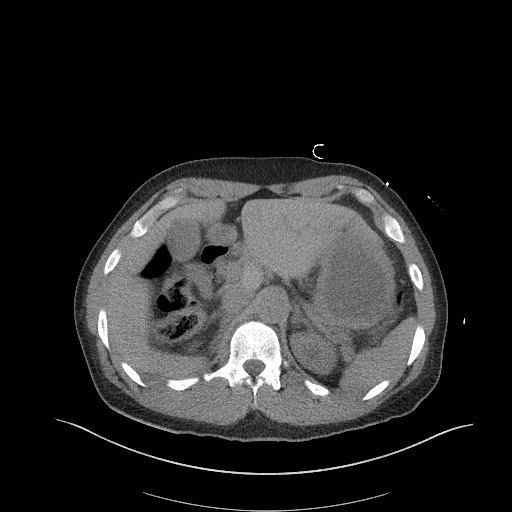
[im 86/109  soft-tissue]
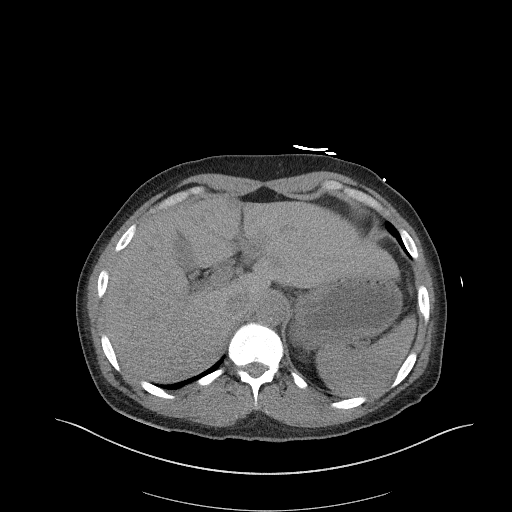
[im 95/109  soft-tissue]
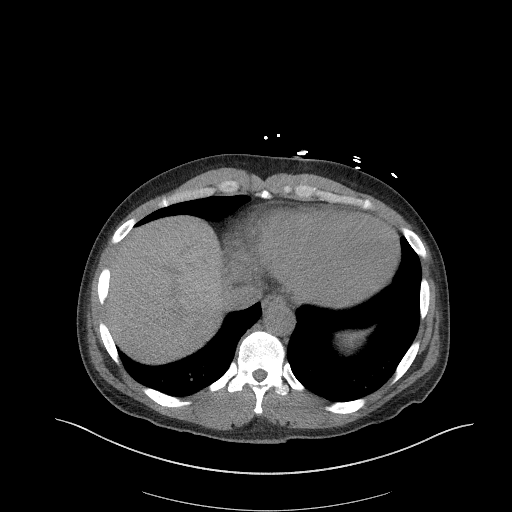
[im 104/109  soft-tissue]
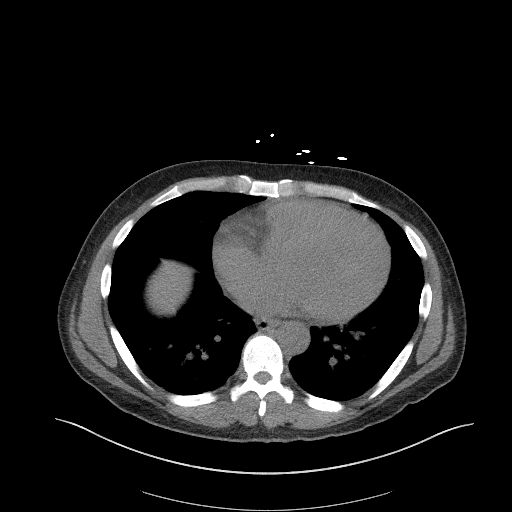

[Series 6: a/p w/o cor · coronal · non-contrast · 0.94mm/px · 3 of 109 slices shown]
[im 37/109  soft-tissue]
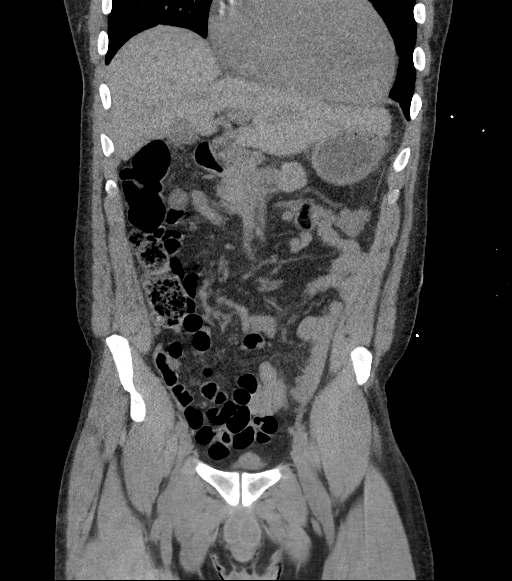
[im 49/109  soft-tissue]
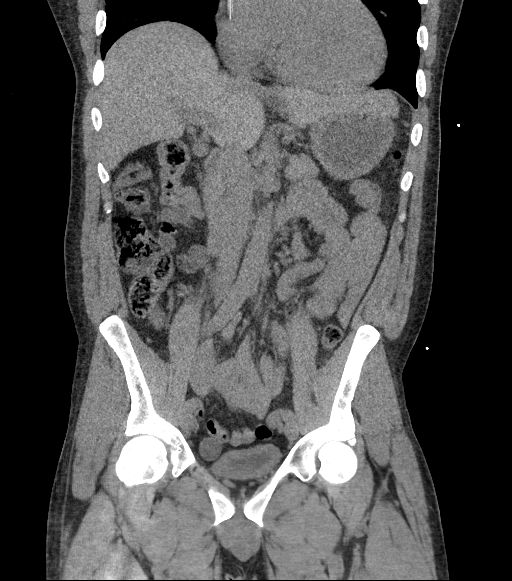
[im 61/109  soft-tissue]
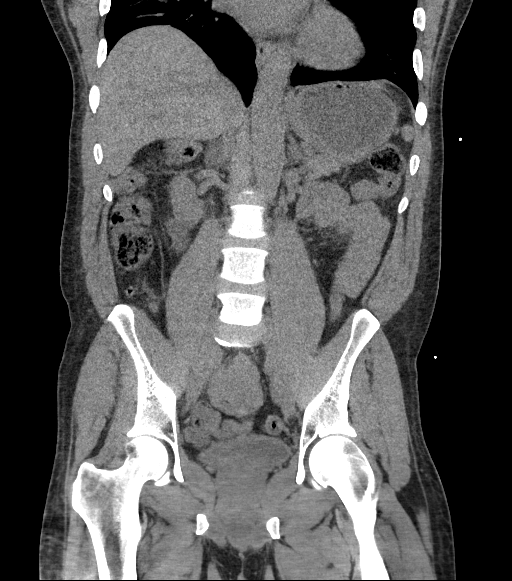

[17 of 46 positions shown; findings below may reference images not displayed]

FINDINGS: Lower chest: Mild atelectasis is seen within the bilateral lung
bases.

Hepatobiliary: No focal liver abnormality is seen. No gallstones,
gallbladder wall thickening, or biliary dilatation.

Pancreas: Unremarkable. No pancreatic ductal dilatation or
surrounding inflammatory changes.

Spleen: Normal in size without focal abnormality.

Adrenals/Urinary Tract: Adrenal glands are unremarkable. Kidneys are
normal, without renal calculi, focal lesion, or hydronephrosis. The
urinary bladder is partially empty. Diffuse urinary bladder wall
thickening is noted.

Stomach/Bowel: Stomach is within normal limits. Appendix appears
normal. No evidence of bowel wall thickening, distention, or
inflammatory changes.

Vascular/Lymphatic: No significant vascular findings are present. No
enlarged abdominal or pelvic lymph nodes.

Reproductive: Prostate is unremarkable.

Other: No abdominal wall hernia or abnormality. No abdominopelvic
ascites.

Musculoskeletal: No acute or significant osseous findings.
IMPRESSION: Diffuse urinary bladder wall thickening which may represent
cystitis. Correlation with urinalysis is recommended.

## 2021-09-29 IMAGING — DX DG CHEST 1V PORT
1 series · 1 of 1 positions shown · non-contrast
Comparison: [DATE]

CLINICAL DATA: Nausea and projectile vomiting.

EXAM:
PORTABLE CHEST 1 VIEW

[chest]
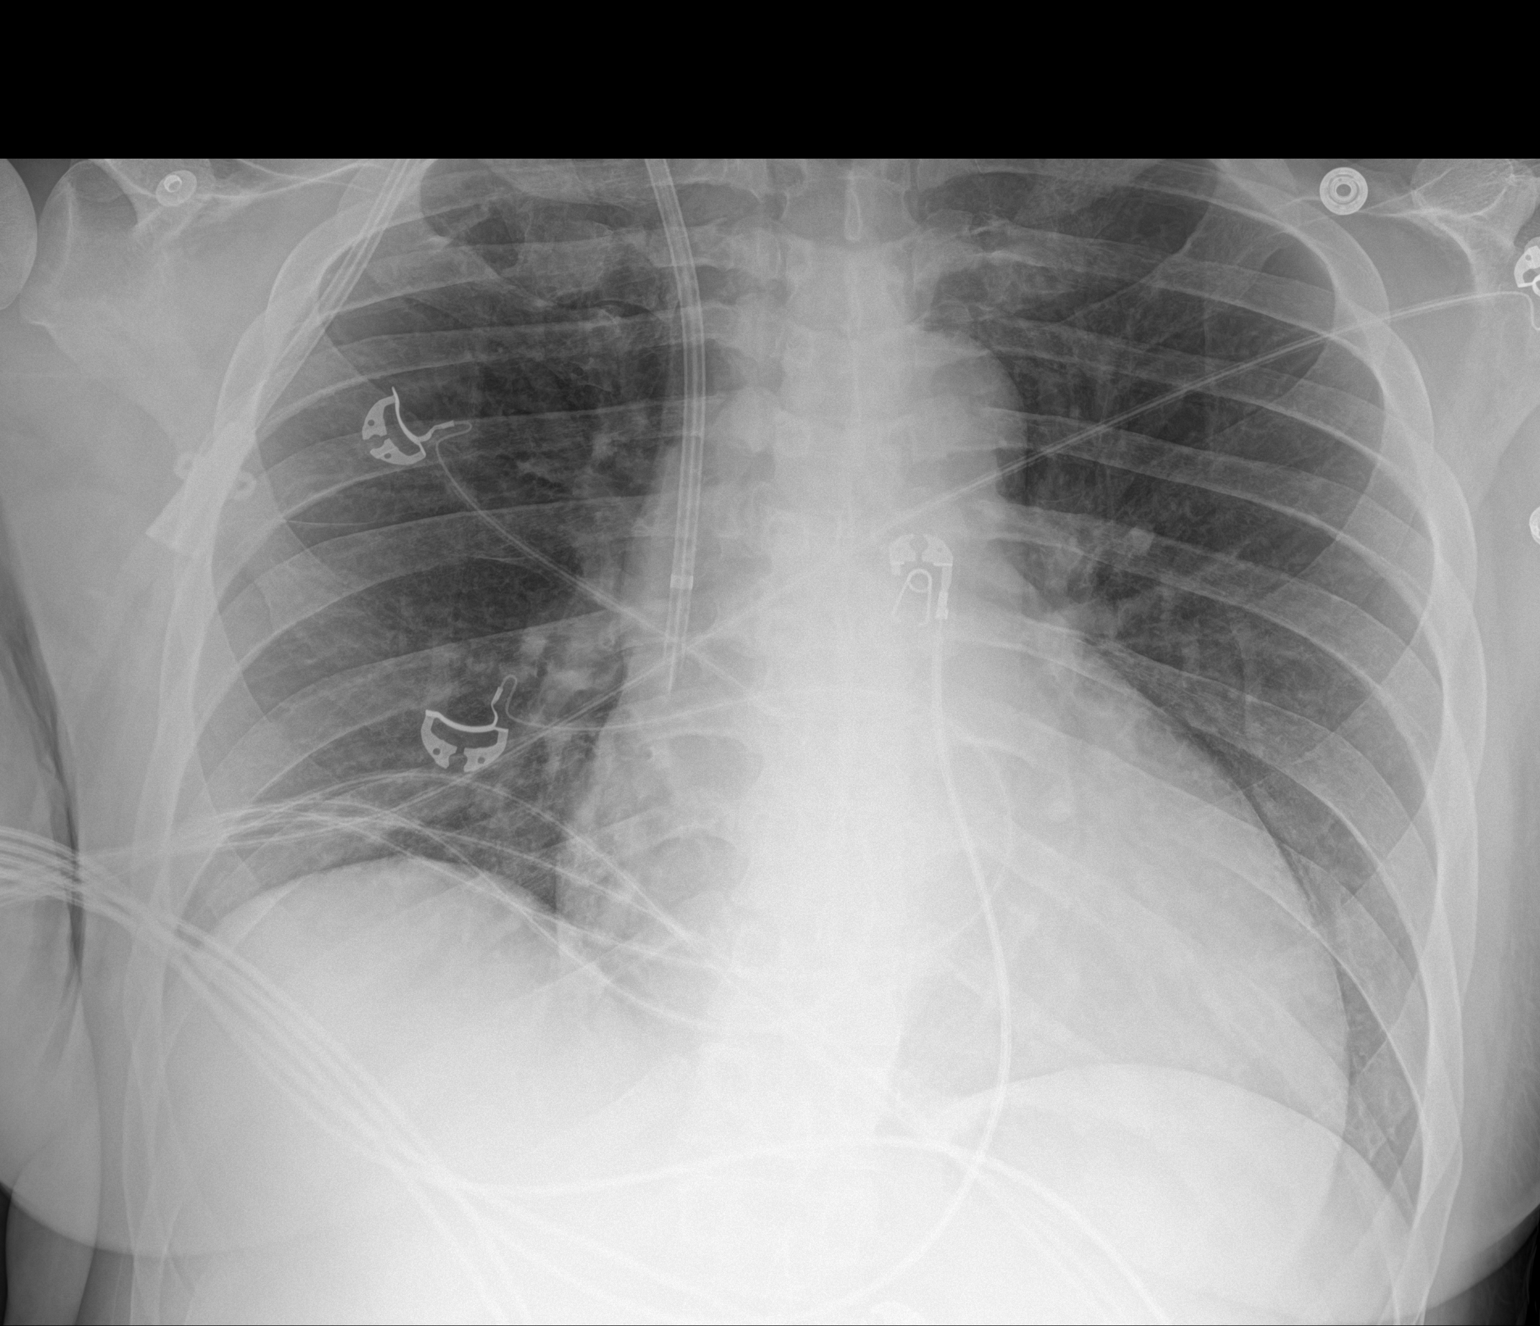

[1 of 1 positions shown; findings below may reference images not displayed]

FINDINGS: There is stable right-sided venous catheter positioning. There is no
evidence of acute infiltrate, pleural effusion or pneumothorax. The
cardiac silhouette is moderately enlarged and unchanged in size. The
visualized skeletal structures are unremarkable.
IMPRESSION: Stable cardiomegaly without evidence of acute or active
cardiopulmonary disease.

## 2021-09-29 MED ORDER — SODIUM CHLORIDE 0.9 % IV SOLN
25.0000 mg | Freq: Four times a day (QID) | INTRAVENOUS | Status: DC | PRN
  Administered 2021-09-30: 25 mg via INTRAVENOUS
  Filled 2021-09-29: qty 1

## 2021-09-29 MED ORDER — ONDANSETRON HCL 4 MG/2ML IJ SOLN
4.0000 mg | Freq: Once | INTRAMUSCULAR | Status: AC
  Administered 2021-09-29: 4 mg via INTRAVENOUS
  Filled 2021-09-29: qty 2

## 2021-09-29 MED ORDER — HYDRALAZINE HCL 20 MG/ML IJ SOLN
20.0000 mg | Freq: Once | INTRAMUSCULAR | Status: AC
  Administered 2021-09-29: 20 mg via INTRAVENOUS
  Filled 2021-09-29: qty 1

## 2021-09-29 NOTE — Progress Notes (Signed)
POST OPERATIVE OFFICE NOTE    CC:  F/u for surgery  HPI:  This is a 35 y.o. 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 Trula Slade.   His original 1st stage was performed on 04/15/2020 by Dr. Donnetta Hutching.  He has a tunneled dialysis catheter that was placed by IR April 2021.   Pt comes in today with his mom.  He states he does not have pain/numbness in the right hand.    He does have some numbness in the upper arm and forearm.  He states that he was cooking and burned the underside of his arm and didn't know it.  He did have some swelling in the upper arm and this is still present but much improved.    He states he has been nauseated due to BP medication.    The pt is on dialysis.     Allergies  Allergen Reactions   Pork-Derived Products Nausea And Vomiting    Current Outpatient Medications  Medication Sig Dispense Refill   amLODipine (NORVASC) 10 MG tablet Take 1 tablet by mouth at bedtime 90 tablet 3   Ascorbic Acid (VITAMIN C PO) Take 1 tablet by mouth daily.     carvedilol (COREG) 25 MG tablet Take 1 tablet (25 mg total) by mouth 2 (two) times daily with a meal. (Patient not taking: Reported on 09/17/2021) 180 tablet 1   cholecalciferol (VITAMIN D3) 25 MCG (1000 UNIT) tablet Take 1,000 Units by mouth daily. On dialysis days     cloNIDine (CATAPRES) 0.3 MG tablet TAKE 1 TABLET (0.3 MG TOTAL) BY MOUTH 3 (THREE) TIMES DAILY. 90 tablet 1   hydrALAZINE (APRESOLINE) 100 MG tablet TAKE 1 TABLET BY MOUTH THREE TIMES DAILY (Patient not taking: Reported on 09/17/2021) 90 tablet 1   ibuprofen (ADVIL) 200 MG tablet Take 200 mg by mouth every 6 (six) hours as needed for mild pain.     losartan (COZAAR) 50 MG tablet TAKE 1 TABLET BY MOUTH EVERY NIGHT FOR HIGH BLOOD PRESSURE (Patient not taking: Reported on 09/17/2021) 90 tablet 3   oxyCODONE-acetaminophen (PERCOCET) 5-325 MG tablet Take 1 tablet by mouth every 6 (six) hours as needed. 20 tablet 0   QUEtiapine (SEROQUEL) 50 MG tablet TAKE 1  TABLET (50 MG TOTAL) BY MOUTH AT BEDTIME. (Patient taking differently: Take 50 mg by mouth at bedtime.) 90 tablet 1   sevelamer carbonate (RENVELA) 800 MG tablet TAKE 4 TABLETS BY MOUTH THREE TIMES DAILY WITH MEALS AND 2 TABLETS TWICE DAILY AS NEEDED WITH SNACKS (Patient taking differently: Take 1,600-3,200 mg by mouth See admin instructions. '3200mg'$  three times daily with meals and '1600mg'$  twice daily with snacks) 480 tablet 11   No current facility-administered medications for this visit.     ROS:  See HPI  Physical Exam:  Incisions:   healing nicely Extremities:   There is a palpable right radial pulse.   Motor and sensory are in tact.   There is a strong thrill present.  The fistula is easily palpable   Assessment/Plan:  This is a 35 y.o. 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 Trula Slade.   His original 1st stage was performed on 04/15/2020 by Dr. Donnetta Hutching.  He has a tunneled dialysis catheter that was placed by IR April 2021.    -the pt does not have evidence of steal and he has strong hand grip and motor and sensory are in tact.  He does not have pain  in the right hand.   He does have some numbness in the upper arm and forearm that most likely is due to nerve irritation.  Discussed with he and his mom to be very vigilant about this.  Hopeful over time, this will improve.   -his incisions are healing nicely.  The fistula has a strong thrill. -the pt will follow up 10/13/2021 at his regularly scheduled appt.  -he is having nausea from new BP medication.  He will call PCP or nephrologist to discuss this.    Leontine Locket, [facility] [phone]  Clinic MD:  Trula Slade

## 2021-09-29 NOTE — Telephone Encounter (Signed)
Patient's mother calls today to report that since surgery on 09/17/21 the patient's arm has been very numb. He was cooking and burned his arm and did not feel the burn.Patient says the numbness does not extend to his hand, and that he can grip things, he just can't feel it. Endorses swelling, but says that is improving. Placed patient on schedule today.
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
## 2021-09-29 NOTE — Telephone Encounter (Signed)
Pt reports vomiting since started taking Amlodpine along with clonidine. Started amlodopine 2 weeks ago, vomiting occurred 2-3 days ago. States BP 130/100 "Which is actually low for me." Unsure if Sharyn Lull was prescriber; may have been Misty Stanley. Pt states able to stay hydrated.   Assured pt NT would route to practice for review. Advised UC/ED for worsening symptoms. Pt verbalizes understanding.   Please advise: [phone] Reason for Disposition  [1] MILD or MODERATE vomiting AND [2] present > 48 hours (2 days) (Exception: mild vomiting with associated diarrhea)  Answer Assessment - Initial Assessment Questions 1. VOMITING SEVERITY: "How many times have you vomited in the past 24 hours?"     - MILD:  1 - 2 times/day    - MODERATE: 3 - 5 times/day, decreased oral intake without significant weight loss or symptoms of dehydration    - SEVERE: 6 or more times/day, vomits everything or nearly everything, with significant weight loss, symptoms of dehydration      Mild to moderate 2. ONSET: "When did the vomiting begin?"      2 days ago 3. FLUIDS: "What fluids or food have you vomited up today?" "Have you been able to keep any fluids down?"     States hydrating 4. ABDOMINAL PAIN: "Are your having any abdominal pain?" If yes : "How bad is it and what does it feel like?" (e.g., crampy, dull, intermittent, constant)      no 5. DIARRHEA: "Is there any diarrhea?" If Yes, ask: "How many times today?"      no 6. CONTACTS: "Is there anyone else in the family with the same symptoms?"      no 7. CAUSE: "What do you think is causing your vomiting?"     Medication 8. HYDRATION STATUS: "Any signs of dehydration?" (e.g., dry mouth [not only dry lips], too weak to stand) "When did you last urinate?"     no 9. OTHER SYMPTOMS: "Do you have any other symptoms?" (e.g., fever, headache, vertigo, vomiting blood or coffee grounds, recent head injury)     nausea  Protocols used: Vomiting-A-AH

## 2021-09-29 NOTE — ED Notes (Signed)
Pt reports he doesn't feel like the Zofran is working. Pt is dry heaving. Dr. Darl Householder informed.

## 2021-09-29 NOTE — Telephone Encounter (Signed)
Amlodipine was prescribed by another provider. Please advise. Or reach out to prescribing PA for advising.

## 2021-09-30 DIAGNOSIS — R112 Nausea with vomiting, unspecified: Secondary | ICD-10-CM | POA: Diagnosis not present

## 2021-09-30 LAB — BASIC METABOLIC PANEL
Anion gap: 17 — ABNORMAL HIGH (ref 5–15)
BUN: 50 mg/dL — ABNORMAL HIGH (ref 6–20)
CO2: 21 mmol/L — ABNORMAL LOW (ref 22–32)
Calcium: 10.7 mg/dL — ABNORMAL HIGH (ref 8.9–10.3)
Chloride: 97 mmol/L — ABNORMAL LOW (ref 98–111)
Creatinine, Ser: 16.75 mg/dL — ABNORMAL HIGH (ref 0.61–1.24)
GFR, Estimated: 3 mL/min — ABNORMAL LOW (ref >60)
Glucose, Bld: 101 mg/dL — ABNORMAL HIGH (ref 70–99)
Potassium: 4.4 mmol/L (ref 3.5–5.1)
Sodium: 135 mmol/L (ref 135–145)

## 2021-09-30 LAB — HEPATIC FUNCTION PANEL
ALT: 6 U/L (ref 0–44)
AST: 21 U/L (ref 15–41)
Albumin: 4.1 g/dL (ref 3.5–5.0)
Alkaline Phosphatase: 72 U/L (ref 38–126)
Bilirubin, Direct: 0.1 mg/dL (ref 0.0–0.2)
Indirect Bilirubin: 0.7 mg/dL (ref 0.3–0.9)
Total Bilirubin: 0.8 mg/dL (ref 0.3–1.2)
Total Protein: 7.4 g/dL (ref 6.5–8.1)

## 2021-09-30 LAB — URINALYSIS, ROUTINE W REFLEX MICROSCOPIC
Bacteria, UA: NONE SEEN
Bilirubin Urine: NEGATIVE
Glucose, UA: 50 mg/dL — AB
Hgb urine dipstick: NEGATIVE
Ketones, ur: NEGATIVE mg/dL
Leukocytes,Ua: NEGATIVE
Nitrite: NEGATIVE
Protein, ur: 100 mg/dL — AB
Specific Gravity, Urine: 1.01 (ref 1.005–1.030)
pH: 9 — ABNORMAL HIGH (ref 5.0–8.0)

## 2021-09-30 LAB — CBC WITH DIFFERENTIAL/PLATELET
Abs Immature Granulocytes: 0.03 10*3/uL (ref 0.00–0.07)
Basophils Absolute: 0.1 10*3/uL (ref 0.0–0.1)
Basophils Relative: 1 %
Eosinophils Absolute: 0.1 10*3/uL (ref 0.0–0.5)
Eosinophils Relative: 1 %
HCT: 29.5 % — ABNORMAL LOW (ref 39.0–52.0)
Hemoglobin: 9.4 g/dL — ABNORMAL LOW (ref 13.0–17.0)
Immature Granulocytes: 0 %
Lymphocytes Relative: 5 %
Lymphs Abs: 0.5 10*3/uL — ABNORMAL LOW (ref 0.7–4.0)
MCH: 30.4 pg (ref 26.0–34.0)
MCHC: 31.9 g/dL (ref 30.0–36.0)
MCV: 95.5 fL (ref 80.0–100.0)
Monocytes Absolute: 0.6 10*3/uL (ref 0.1–1.0)
Monocytes Relative: 6 %
Neutro Abs: 9.4 10*3/uL — ABNORMAL HIGH (ref 1.7–7.7)
Neutrophils Relative %: 87 %
Platelets: 166 10*3/uL (ref 150–400)
RBC: 3.09 MIL/uL — ABNORMAL LOW (ref 4.22–5.81)
RDW: 15.6 % — ABNORMAL HIGH (ref 11.5–15.5)
WBC: 10.7 10*3/uL — ABNORMAL HIGH (ref 4.0–10.5)
nRBC: 0 % (ref 0.0–0.2)

## 2021-09-30 LAB — URINE CULTURE: Culture: <10000 — AB

## 2021-09-30 LAB — LIPASE, BLOOD: Lipase: 97 U/L — ABNORMAL HIGH (ref 11–51)

## 2021-09-30 MED ORDER — CARVEDILOL 12.5 MG PO TABS
25.0000 mg | ORAL_TABLET | Freq: Once | ORAL | Status: AC
  Administered 2021-09-30: 25 mg via ORAL
  Filled 2021-09-30: qty 2

## 2021-09-30 MED ORDER — SODIUM CHLORIDE 0.9 % IV BOLUS: 500.0000 mL | Freq: Once | INTRAVENOUS | Status: DC

## 2021-09-30 MED ORDER — FUROSEMIDE 10 MG/ML IJ SOLN
40.0000 mg | Freq: Once | INTRAMUSCULAR | Status: AC
  Administered 2021-09-30: 40 mg via INTRAVENOUS
  Filled 2021-09-30: qty 4

## 2021-09-30 MED ORDER — PROMETHAZINE HCL 25 MG PO TABS: 25.0000 mg | ORAL_TABLET | Freq: Four times a day (QID) | ORAL | 0 refills | Status: DC | PRN

## 2021-09-30 MED ORDER — LOSARTAN POTASSIUM 50 MG PO TABS
50.0000 mg | ORAL_TABLET | Freq: Once | ORAL | Status: AC
  Administered 2021-09-30: 50 mg via ORAL
  Filled 2021-09-30: qty 1

## 2021-09-30 MED ORDER — AMLODIPINE BESYLATE 5 MG PO TABS
10.0000 mg | ORAL_TABLET | Freq: Once | ORAL | Status: AC
  Administered 2021-09-30: 10 mg via ORAL
  Filled 2021-09-30: qty 2

## 2021-09-30 MED ORDER — CLONIDINE HCL 0.2 MG PO TABS
0.3000 mg | ORAL_TABLET | Freq: Once | ORAL | Status: AC
  Administered 2021-09-30: 0.3 mg via ORAL
  Filled 2021-09-30: qty 1

## 2021-09-30 NOTE — ED Notes (Signed)
Pt informed a urine sample Is needed but states he does not have to urinate at this time.

## 2021-09-30 NOTE — ED Notes (Signed)
Pt A&OX4 ambulatory at d/c with independent steady gait 

## 2021-09-30 NOTE — Telephone Encounter (Signed)
Patient is NOT requesting a refill. Per encounter patient is reporting vomiting since beginning amlodipine with clonidine. Please advise.

## 2021-09-30 NOTE — Discharge Instructions (Addendum)
Please go to dialysis in the morning   Take your BP meds after dialysis   Take phenergan for nausea   See your doctor for follow up   Return to ER if you have worse abdominal pain, vomiting, blood pressure greater than 200

## 2021-10-01 NOTE — Telephone Encounter (Signed)
Contacted patient to discuss. He was sleeping. Per DPR spoke with is mother. She is aware that amlodipine was not prescribed by PCP. Informed mother that it appears medication was prescribed by provider at kidney center or dialysis. Advised her to contact them to discuss issues with medications and symptoms caused from taking it. Provided patients mother with phone number.

## 2021-10-03 ENCOUNTER — Ambulatory Visit: Payer: Self-pay

## 2021-10-03 NOTE — Telephone Encounter (Signed)
Pt.'s mother reports pt. Seen in ED for nausea Monday. Given Promethazine which helped, but pt. Is now out of the medication. Asking for a refill to be sent to [facility]. PCP not in the office today. Peter Congo in the practice states to see if [facility] can fill medicine. Also recommends pt. Goes to UC. Mother declines. Please advise.   Answer Assessment - Initial Assessment Questions 1. NAUSEA SEVERITY: "How bad is the nausea?" (e.g., mild, moderate, severe; dehydration, weight loss)   - MILD: loss of appetite without change in eating habits   - MODERATE: decreased oral intake without significant weight loss, dehydration, or malnutrition   - SEVERE: inadequate caloric or fluid intake, significant weight loss, symptoms of dehydration     Moderate 2. ONSET: "When did the nausea begin?"     Monday - seen in ED 3. VOMITING: "Any vomiting?" If Yes, ask: "How many times today?"     Last night x 1 4. RECURRENT SYMPTOM: "Have you had nausea before?" If Yes, ask: "When was the last time?" "What happened that time?"     Yes 5. CAUSE: "What do you think is causing the nausea?"     Unsure 6. PREGNANCY: "Is there any chance you are pregnant?" (e.g., unprotected intercourse, missed birth control pill, broken condom)     N/a  Protocols used: Nausea-A-AH

## 2021-10-05 ENCOUNTER — Other Ambulatory Visit: Payer: Self-pay | Admitting: Nurse Practitioner

## 2021-10-05 MED ORDER — ONDANSETRON 4 MG PO TBDP
4.0000 mg | ORAL_TABLET | Freq: Three times a day (TID) | ORAL | 0 refills | Status: DC | PRN
Start: 2021-10-05 — End: 2021-12-15

## 2021-10-05 NOTE — Telephone Encounter (Signed)
Zofran sent to [facility].

## 2021-10-06 NOTE — Telephone Encounter (Signed)
Called pt unable to leave VM at this time.

## 2021-10-13 ENCOUNTER — Encounter: Payer: Medicare Other | Admitting: Surgery

## 2021-10-16 ENCOUNTER — Other Ambulatory Visit: Payer: Self-pay

## 2021-10-16 MED ORDER — LOSARTAN POTASSIUM 100 MG PO TABS
ORAL_TABLET | ORAL | 3 refills | Status: AC
  Filled 2021-10-16 – 2022-05-15 (×3): qty 90, 90d supply, fill #0

## 2021-10-20 ENCOUNTER — Ambulatory Visit (INDEPENDENT_AMBULATORY_CARE_PROVIDER_SITE_OTHER): Payer: Medicare Other | Admitting: Surgery

## 2021-10-20 ENCOUNTER — Encounter: Payer: Self-pay | Admitting: Surgery

## 2021-10-20 ENCOUNTER — Other Ambulatory Visit: Payer: Self-pay

## 2021-10-20 VITALS — BP 168/97 | HR 62 | Temp 98.5°F | Resp 20 | Ht 77.0 in | Wt 225.0 lb

## 2021-10-20 DIAGNOSIS — N186 End stage renal disease: Secondary | ICD-10-CM

## 2021-10-20 DIAGNOSIS — Z992 Dependence on renal dialysis: Secondary | ICD-10-CM

## 2021-10-20 NOTE — Progress Notes (Signed)
Patient name: Douglas [name] MRN: [id number] DOB: [birth year] Sex: male  REASON FOR VISIT:    Postop  HISTORY OF PRESENT ILLNESS:   Douglas [name] is a 35 y.o. male who initially underwent a right for stage basilic vein fistula by Dr. Donnetta Hutching on 04/15/2020.  I performed his second stage on 09/17/2021.  He has no complaints today.  CURRENT MEDICATIONS:    Current Outpatient Medications  Medication Sig Dispense Refill   amLODipine (NORVASC) 10 MG tablet Take 1 tablet by mouth at bedtime 90 tablet 3   Ascorbic Acid (VITAMIN C PO) Take 1 tablet by mouth daily.     cholecalciferol (VITAMIN D3) 25 MCG (1000 UNIT) tablet Take 1,000 Units by mouth daily. On dialysis days     cloNIDine (CATAPRES) 0.3 MG tablet TAKE 1 TABLET (0.3 MG TOTAL) BY MOUTH 3 (THREE) TIMES DAILY. 90 tablet 1   ibuprofen (ADVIL) 200 MG tablet Take 200 mg by mouth every 6 (six) hours as needed for mild pain.     losartan (COZAAR) 100 MG tablet Take 1 tablet by mouth every night for high blood pressure 90 tablet 3   ondansetron (ZOFRAN ODT) 4 MG disintegrating tablet Take 1 tablet (4 mg total) by mouth every 8 (eight) hours as needed for nausea or vomiting. 30 tablet 0   oxyCODONE-acetaminophen (PERCOCET) 5-325 MG tablet Take 1 tablet by mouth every 6 (six) hours as needed. 20 tablet 0   promethazine (PHENERGAN) 25 MG tablet Take 1 tablet (25 mg total) by mouth every 6 (six) hours as needed for nausea or vomiting. 10 tablet 0   QUEtiapine (SEROQUEL) 50 MG tablet TAKE 1 TABLET (50 MG TOTAL) BY MOUTH AT BEDTIME. (Patient taking differently: Take 50 mg by mouth at bedtime.) 90 tablet 1   sevelamer carbonate (RENVELA) 800 MG tablet TAKE 4 TABLETS BY MOUTH THREE TIMES DAILY WITH MEALS AND 2 TABLETS TWICE DAILY AS NEEDED WITH SNACKS (Patient taking differently: Take 1,600-3,200 mg by mouth See admin instructions. 3200mg  three times daily with meals and 1600mg  twice daily with snacks) 480  tablet 11   carvedilol (COREG) 25 MG tablet Take 1 tablet (25 mg total) by mouth 2 (two) times daily with a meal. (Patient not taking: No sig reported) 180 tablet 1   hydrALAZINE (APRESOLINE) 100 MG tablet TAKE 1 TABLET BY MOUTH THREE TIMES DAILY (Patient not taking: No sig reported) 90 tablet 1   losartan (COZAAR) 50 MG tablet TAKE 1 TABLET BY MOUTH EVERY NIGHT FOR HIGH BLOOD PRESSURE (Patient not taking: No sig reported) 90 tablet 3   No current facility-administered medications for this visit.    REVIEW OF SYSTEMS:   [X]  denotes positive finding, [ ]  denotes negative finding Cardiac  Comments:  Chest pain or chest pressure:    Shortness of breath upon exertion:    Short of breath when lying flat:    Irregular heart rhythm:    Constitutional    Fever or chills:      PHYSICAL EXAM:   Vitals:   10/20/21 0908  BP: (!) 168/97  Pulse: 62  Resp: 20  Temp: 98.5 F (36.9 C)  SpO2: 93%  Weight: 102.1 kg  Height: 6\' 5"  (1.956 m)    GENERAL: The patient is a well-nourished male, in no acute distress. The vital signs are documented above. CARDIOVASCULAR: There is a regular rate and rhythm. PULMONARY: Non-labored respirations Easily palpable thrill within fistula.  No swelling  STUDIES:   None   MEDICAL  ISSUES:   Fistula is ready for use.  He will follow-up on an as-needed basis  [name], MD, FACS [facility] [phone] Pager [phone]

## 2021-10-23 ENCOUNTER — Other Ambulatory Visit: Payer: Self-pay

## 2021-10-30 ENCOUNTER — Other Ambulatory Visit: Payer: Self-pay

## 2021-11-13 ENCOUNTER — Other Ambulatory Visit (HOSPITAL_COMMUNITY): Payer: Self-pay

## 2021-11-18 ENCOUNTER — Other Ambulatory Visit: Payer: Self-pay

## 2021-11-18 MED ORDER — VALSARTAN 160 MG PO TABS
ORAL_TABLET | ORAL | 11 refills | Status: DC
  Filled 2021-11-18: qty 90, 90d supply, fill #0

## 2021-11-24 ENCOUNTER — Other Ambulatory Visit: Payer: Self-pay

## 2021-11-24 ENCOUNTER — Other Ambulatory Visit (INDEPENDENT_AMBULATORY_CARE_PROVIDER_SITE_OTHER): Payer: Self-pay | Admitting: Nurse Practitioner

## 2021-11-25 ENCOUNTER — Other Ambulatory Visit: Payer: Self-pay

## 2021-11-26 ENCOUNTER — Other Ambulatory Visit: Payer: Self-pay

## 2021-12-01 ENCOUNTER — Other Ambulatory Visit: Payer: Self-pay

## 2021-12-01 ENCOUNTER — Other Ambulatory Visit (INDEPENDENT_AMBULATORY_CARE_PROVIDER_SITE_OTHER): Payer: Self-pay | Admitting: Primary Care

## 2021-12-01 MED ORDER — CLONIDINE HCL 0.3 MG PO TABS
0.3000 mg | ORAL_TABLET | Freq: Three times a day (TID) | ORAL | 0 refills | Status: DC
  Filled 2021-12-01: qty 90, 30d supply, fill #0

## 2021-12-04 ENCOUNTER — Other Ambulatory Visit: Payer: Self-pay

## 2021-12-04 MED ORDER — SEVELAMER CARBONATE 800 MG PO TABS
ORAL_TABLET | ORAL | 11 refills | Status: DC
  Filled 2021-12-04: qty 540, 30d supply, fill #0

## 2021-12-11 ENCOUNTER — Other Ambulatory Visit: Payer: Self-pay

## 2021-12-15 ENCOUNTER — Other Ambulatory Visit: Payer: Self-pay

## 2021-12-15 ENCOUNTER — Encounter (INDEPENDENT_AMBULATORY_CARE_PROVIDER_SITE_OTHER): Payer: Self-pay | Admitting: Primary Care

## 2021-12-15 ENCOUNTER — Ambulatory Visit (INDEPENDENT_AMBULATORY_CARE_PROVIDER_SITE_OTHER): Payer: Medicare Other | Admitting: Primary Care

## 2021-12-15 VITALS — BP 171/95 | HR 75 | Temp 97.9°F | Ht 77.0 in | Wt 222.4 lb

## 2021-12-15 DIAGNOSIS — R112 Nausea with vomiting, unspecified: Secondary | ICD-10-CM

## 2021-12-15 MED ORDER — ONDANSETRON 4 MG PO TBDP
4.0000 mg | ORAL_TABLET | Freq: Three times a day (TID) | ORAL | 0 refills | Status: DC | PRN
  Filled 2021-12-15: qty 30, 10d supply, fill #0

## 2021-12-15 NOTE — Progress Notes (Signed)
Patient states he has new medication.Marland KitchenMarland KitchenSeroquil

## 2021-12-16 ENCOUNTER — Other Ambulatory Visit: Payer: Self-pay
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
## 2021-12-23 ENCOUNTER — Other Ambulatory Visit: Payer: Self-pay

## 2021-12-23 MED ORDER — SEVELAMER CARBONATE 800 MG PO TABS
ORAL_TABLET | ORAL | 11 refills | Status: DC
  Filled 2021-12-23: qty 540, 30d supply, fill #0

## 2021-12-23 MED ORDER — CLONIDINE HCL 0.3 MG PO TABS
ORAL_TABLET | ORAL | 3 refills | Status: DC
  Filled 2021-12-23: qty 180, 90d supply, fill #0
  Filled 2022-02-18: qty 180, 90d supply, fill #1
  Filled 2022-02-18: qty 180, 90d supply, fill #0

## 2021-12-24 ENCOUNTER — Other Ambulatory Visit: Payer: Self-pay

## 2022-02-05 ENCOUNTER — Other Ambulatory Visit: Payer: Self-pay

## 2022-02-05 MED ORDER — LOSARTAN POTASSIUM 100 MG PO TABS
ORAL_TABLET | ORAL | 3 refills | Status: DC
  Filled 2022-02-05: qty 90, 90d supply, fill #0

## 2022-02-14 ENCOUNTER — Inpatient Hospital Stay (HOSPITAL_COMMUNITY): Payer: Medicare Other

## 2022-02-14 ENCOUNTER — Encounter (HOSPITAL_COMMUNITY): Payer: Self-pay | Admitting: Emergency Medicine

## 2022-02-14 ENCOUNTER — Emergency Department (HOSPITAL_COMMUNITY): Payer: Medicare Other

## 2022-02-14 ENCOUNTER — Inpatient Hospital Stay (HOSPITAL_COMMUNITY)
Admission: EM | Admit: 2022-02-14 | Discharge: 2022-02-15 | DRG: 555 | Discharge status: Against Medical Advice | Payer: Medicare Other | Attending: Internal Medicine | Admitting: Internal Medicine

## 2022-02-14 DIAGNOSIS — N189 Chronic kidney disease, unspecified: Secondary | ICD-10-CM | POA: Diagnosis present

## 2022-02-14 DIAGNOSIS — F1210 Cannabis abuse, uncomplicated: Secondary | ICD-10-CM | POA: Diagnosis present

## 2022-02-14 DIAGNOSIS — R799 Abnormal finding of blood chemistry, unspecified: Secondary | ICD-10-CM

## 2022-02-14 DIAGNOSIS — Z8249 Family history of ischemic heart disease and other diseases of the circulatory system: Secondary | ICD-10-CM

## 2022-02-14 DIAGNOSIS — R609 Edema, unspecified: Secondary | ICD-10-CM

## 2022-02-14 DIAGNOSIS — D631 Anemia in chronic kidney disease: Secondary | ICD-10-CM | POA: Diagnosis present

## 2022-02-14 DIAGNOSIS — Z91014 Allergy to mammalian meats: Secondary | ICD-10-CM | POA: Diagnosis not present

## 2022-02-14 DIAGNOSIS — J449 Chronic obstructive pulmonary disease, unspecified: Secondary | ICD-10-CM | POA: Diagnosis present

## 2022-02-14 DIAGNOSIS — N186 End stage renal disease: Secondary | ICD-10-CM | POA: Diagnosis present

## 2022-02-14 DIAGNOSIS — Z79899 Other long term (current) drug therapy: Secondary | ICD-10-CM | POA: Diagnosis not present

## 2022-02-14 DIAGNOSIS — F17210 Nicotine dependence, cigarettes, uncomplicated: Secondary | ICD-10-CM | POA: Diagnosis present

## 2022-02-14 DIAGNOSIS — Z992 Dependence on renal dialysis: Secondary | ICD-10-CM

## 2022-02-14 DIAGNOSIS — Z9115 Patient's noncompliance with renal dialysis: Secondary | ICD-10-CM | POA: Diagnosis not present

## 2022-02-14 DIAGNOSIS — E875 Hyperkalemia: Secondary | ICD-10-CM | POA: Diagnosis present

## 2022-02-14 DIAGNOSIS — Z20822 Contact with and (suspected) exposure to covid-19: Secondary | ICD-10-CM | POA: Diagnosis present

## 2022-02-14 DIAGNOSIS — Z825 Family history of asthma and other chronic lower respiratory diseases: Secondary | ICD-10-CM | POA: Diagnosis not present

## 2022-02-14 DIAGNOSIS — M25521 Pain in right elbow: Principal | ICD-10-CM | POA: Diagnosis present

## 2022-02-14 DIAGNOSIS — I120 Hypertensive chronic kidney disease with stage 5 chronic kidney disease or end stage renal disease: Secondary | ICD-10-CM | POA: Diagnosis present

## 2022-02-14 DIAGNOSIS — I129 Hypertensive chronic kidney disease with stage 1 through stage 4 chronic kidney disease, or unspecified chronic kidney disease: Secondary | ICD-10-CM | POA: Diagnosis present

## 2022-02-14 DIAGNOSIS — Z841 Family history of disorders of kidney and ureter: Secondary | ICD-10-CM | POA: Diagnosis not present

## 2022-02-14 LAB — BASIC METABOLIC PANEL
Anion gap: 15 (ref 5–15)
BUN: 85 mg/dL — ABNORMAL HIGH (ref 6–20)
CO2: 26 mmol/L (ref 22–32)
Calcium: 9.9 mg/dL (ref 8.9–10.3)
Chloride: 92 mmol/L — ABNORMAL LOW (ref 98–111)
Creatinine, Ser: 16.52 mg/dL — ABNORMAL HIGH (ref 0.61–1.24)
GFR, Estimated: 3 mL/min — ABNORMAL LOW (ref >60)
Glucose, Bld: 100 mg/dL — ABNORMAL HIGH (ref 70–99)
Potassium: 5.6 mmol/L — ABNORMAL HIGH (ref 3.5–5.1)
Sodium: 133 mmol/L — ABNORMAL LOW (ref 135–145)

## 2022-02-14 LAB — CBC WITH DIFFERENTIAL/PLATELET
Abs Immature Granulocytes: 0.04 10*3/uL (ref 0.00–0.07)
Basophils Absolute: 0 10*3/uL (ref 0.0–0.1)
Basophils Relative: 1 %
Eosinophils Absolute: 0.2 10*3/uL (ref 0.0–0.5)
Eosinophils Relative: 2 %
HCT: 34 % — ABNORMAL LOW (ref 39.0–52.0)
Hemoglobin: 10.8 g/dL — ABNORMAL LOW (ref 13.0–17.0)
Immature Granulocytes: 1 %
Lymphocytes Relative: 15 %
Lymphs Abs: 1.1 10*3/uL (ref 0.7–4.0)
MCH: 31.1 pg (ref 26.0–34.0)
MCHC: 31.8 g/dL (ref 30.0–36.0)
MCV: 98 fL (ref 80.0–100.0)
Monocytes Absolute: 0.9 10*3/uL (ref 0.1–1.0)
Monocytes Relative: 11 %
Neutro Abs: 5.4 10*3/uL (ref 1.7–7.7)
Neutrophils Relative %: 70 %
Platelets: 206 10*3/uL (ref 150–400)
RBC: 3.47 MIL/uL — ABNORMAL LOW (ref 4.22–5.81)
RDW: 17.2 % — ABNORMAL HIGH (ref 11.5–15.5)
WBC: 7.7 10*3/uL (ref 4.0–10.5)
nRBC: 0 % (ref 0.0–0.2)

## 2022-02-14 LAB — RESP PANEL BY RT-PCR (FLU A&B, COVID) ARPGX2
Influenza A by PCR: NEGATIVE
Influenza B by PCR: NEGATIVE
SARS Coronavirus 2 by RT PCR: NEGATIVE

## 2022-02-14 LAB — C-REACTIVE PROTEIN: CRP: <0.5 mg/dL (ref <1.0)

## 2022-02-14 LAB — SEDIMENTATION RATE: Sed Rate: 15 mm/hr (ref 0–16)

## 2022-02-14 IMAGING — CR DG ELBOW COMPLETE 3+V*R*
4 series · 4 of 4 positions shown · non-contrast
Comparison: Forearm series today.

CLINICAL DATA: 35-year-old male with upper extremity pain at the
side of dialysis fistula.

EXAM:
RIGHT ELBOW - COMPLETE 3+ VIEW

[x elbow ap right]
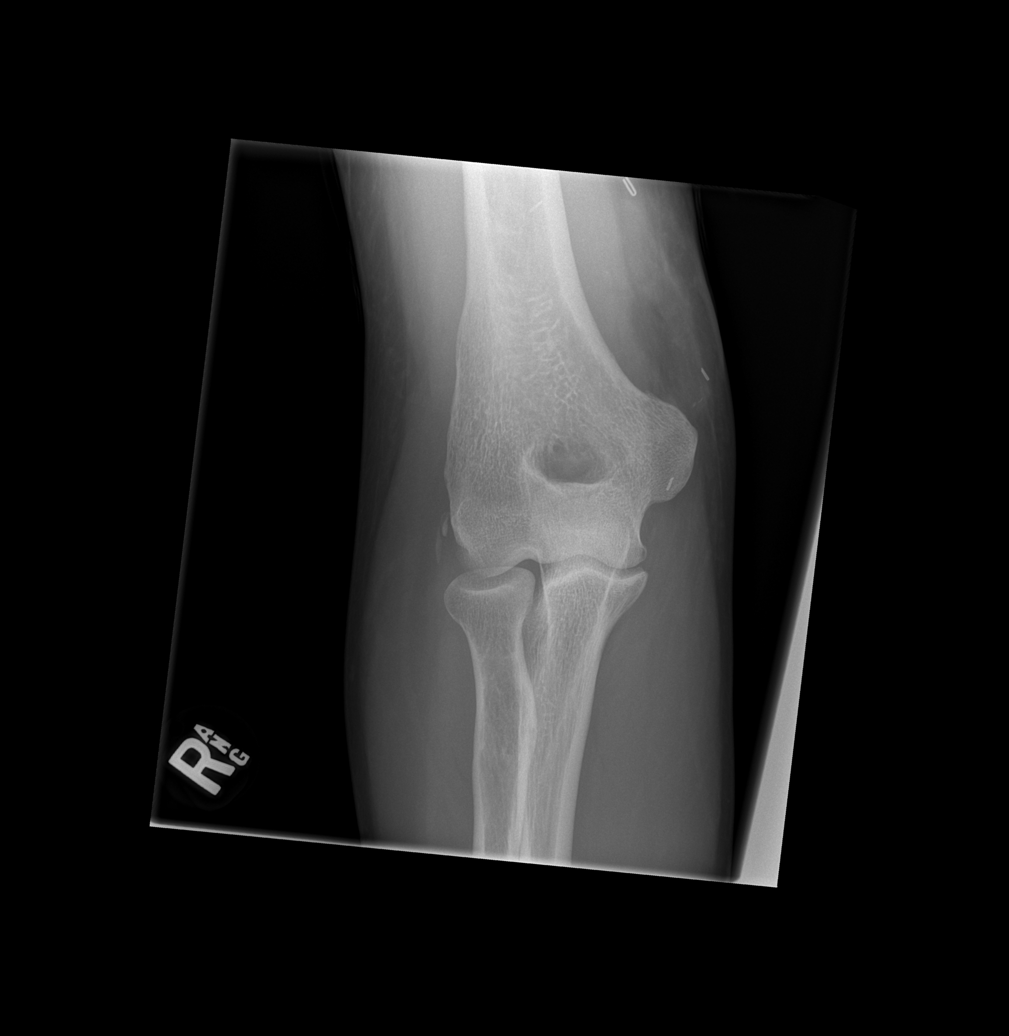

[x elbow obl right (1 of 2)]
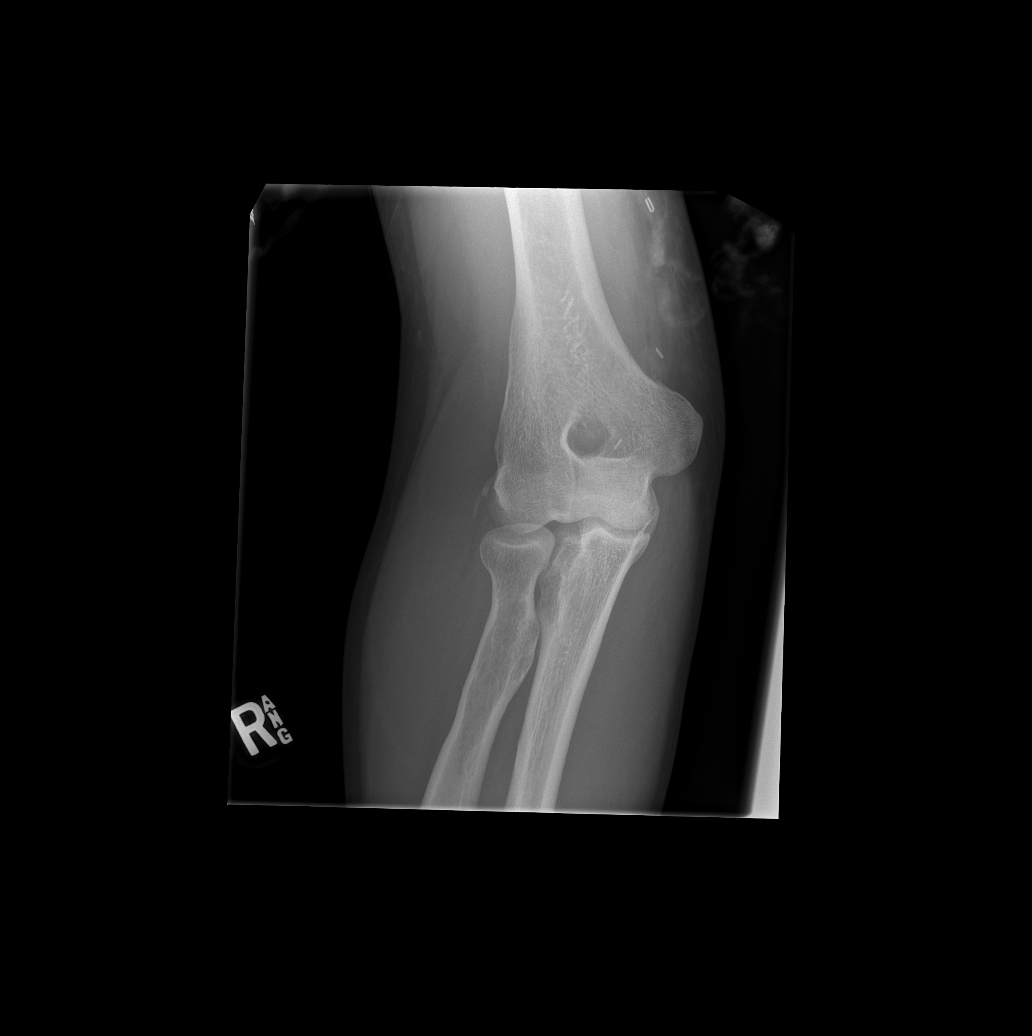

[x elbow obl right (2 of 2)]
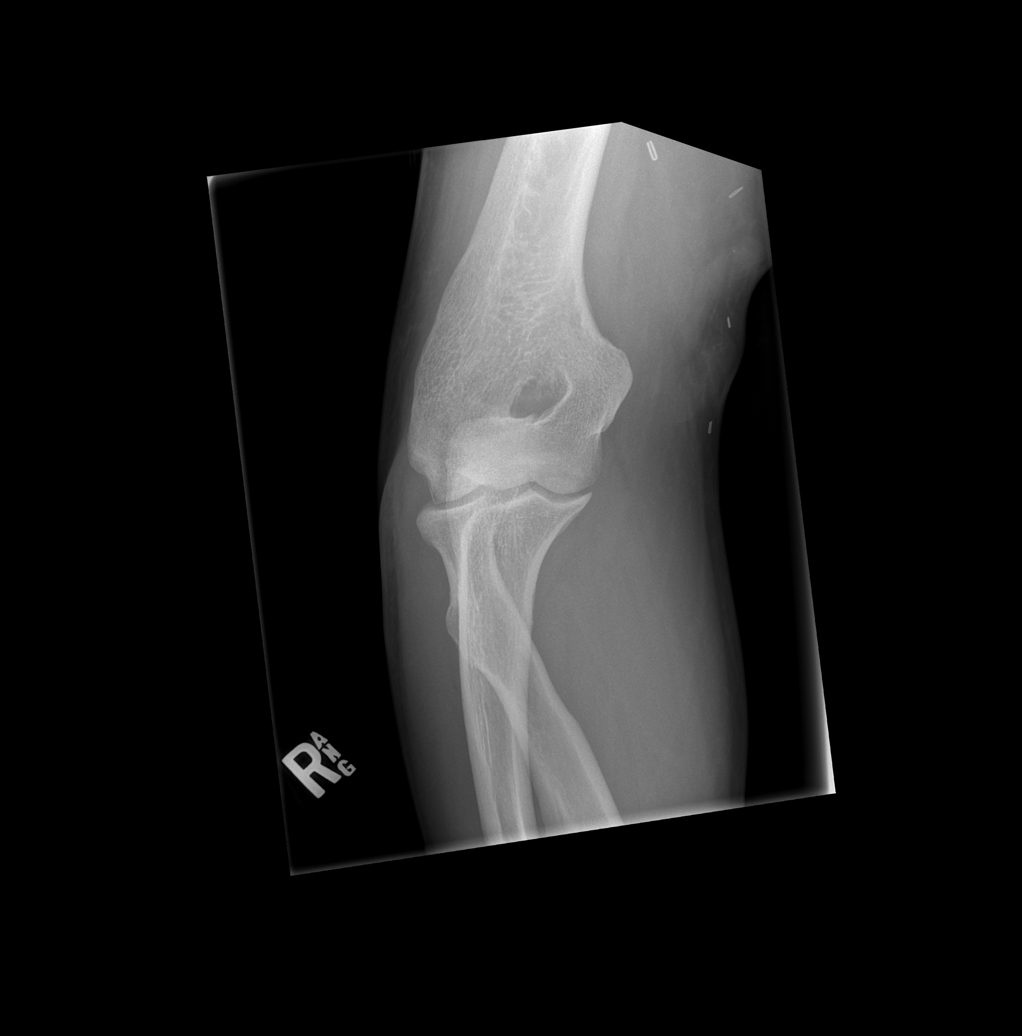

[x elbow lat right]
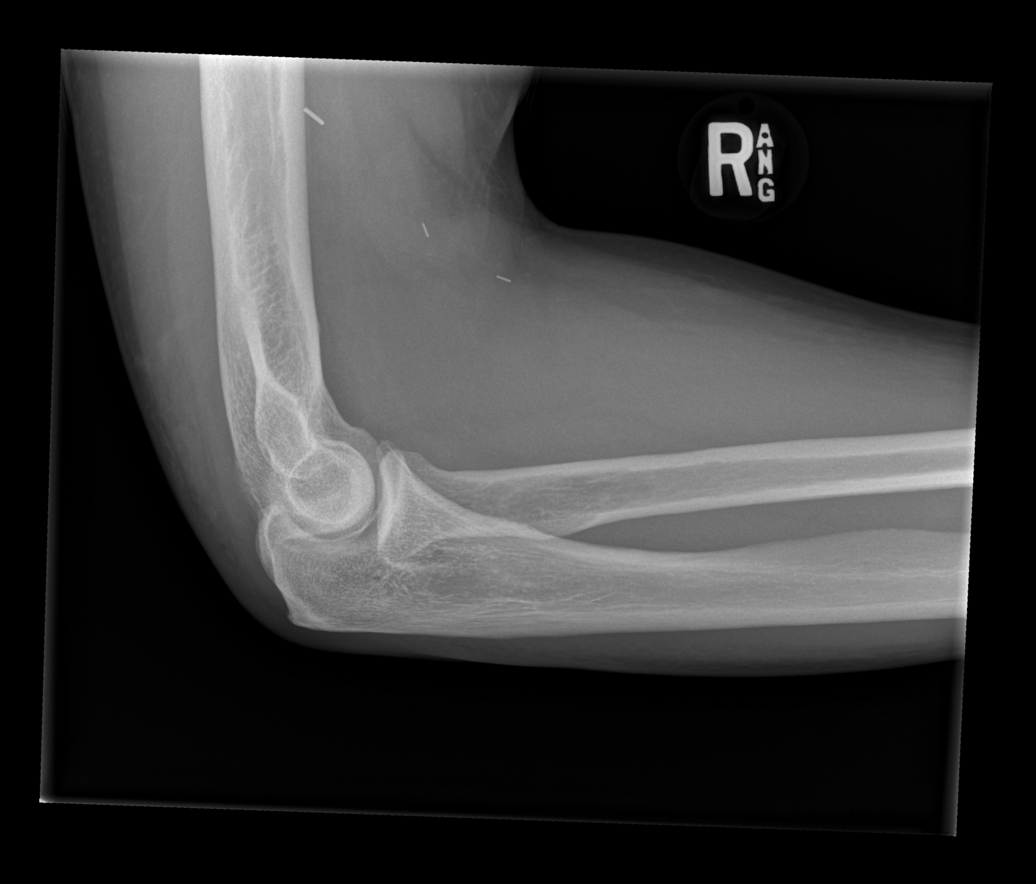

[4 of 4 positions shown; findings below may reference images not displayed]

FINDINGS: Probable fistula related surgical clips in the medial soft tissues
along the distal humerus, upper antecubital fossa. Bone
mineralization is within normal limits. Distal humerus intact with
small curvilinear dystrophic calcification along the lateral
epicondyle. Joint spaces and alignment maintained. No evidence of
joint effusion. Radial head appears intact. No acute osseous
abnormality identified.
IMPRESSION: 1. Dystrophic, possibly posttraumatic soft tissue calcifications
along the lateral epicondyle. No acute osseous abnormality
identified about the right elbow.
2. Probable fistula related surgical clips.

## 2022-02-14 IMAGING — MR MR ELBOW*R* W/O CM
4 of 7 series · 19 of 40 positions shown · non-contrast
Comparison: Same day elbow radiograph

CLINICAL DATA: Septic arthritis suspected, elbow, xray done Concern
for septic arthritis

EXAM:
MRI OF THE RIGHT ELBOW WITHOUT CONTRAST
TECHNIQUE: Multiplanar, multisequence MR imaging of the elbow was performed. No
intravenous contrast was administered.

[Series 7: T2 fat-sat · axial · right · 3.0mm · 0.25mm/px · z∈[-19,+75]mm · 6 of 25 slices shown (1 of 4)]
[im 1/25]
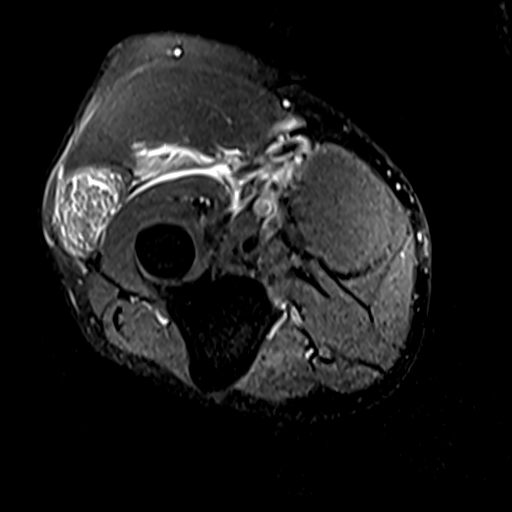
[im 5/25]
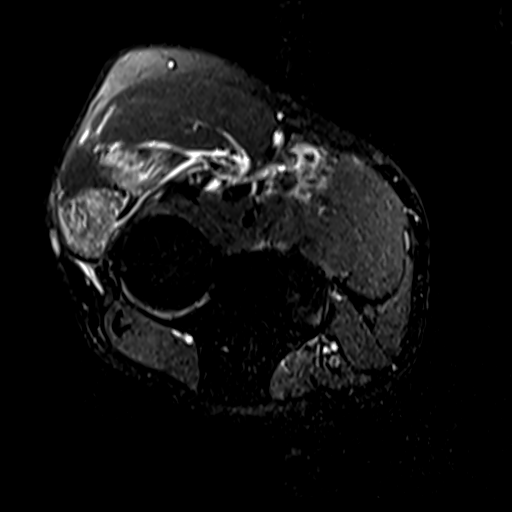
[im 10/25]
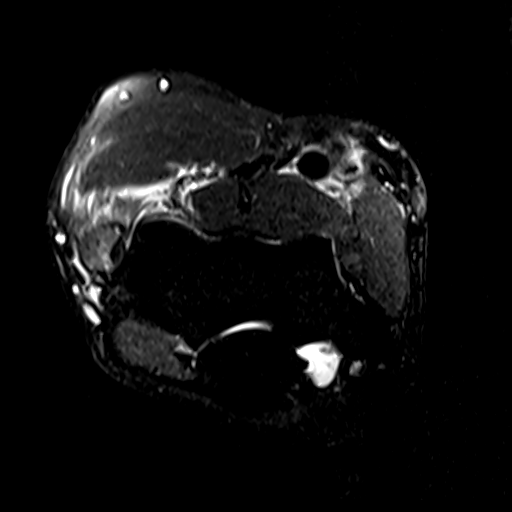
[im 15/25]
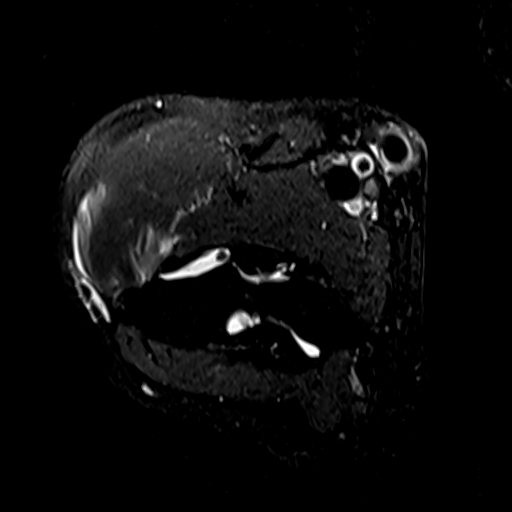
[im 20/25]
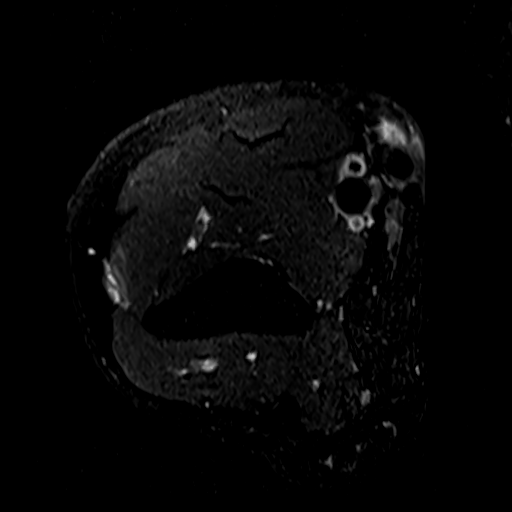
[im 25/25]
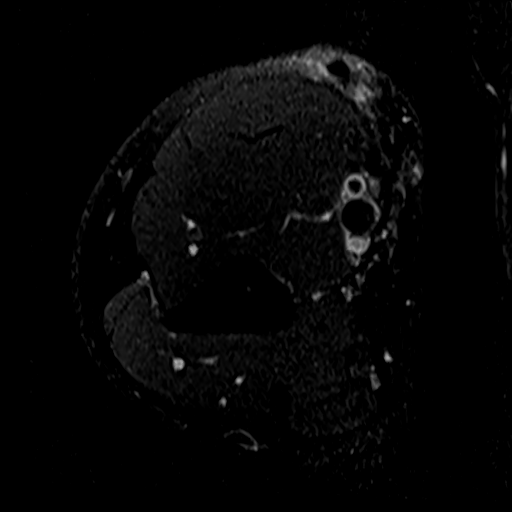

[Series 8: T2 fat-sat · coronal · right · 3.0mm · 0.27mm/px · 6 of 26 slices shown (2 of 4)]
[im 1/26]
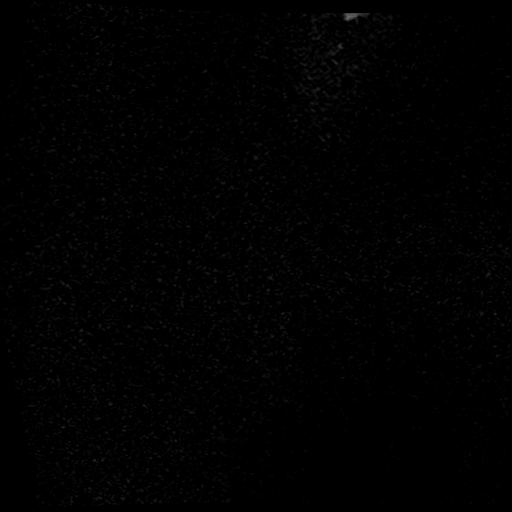
[im 6/26]
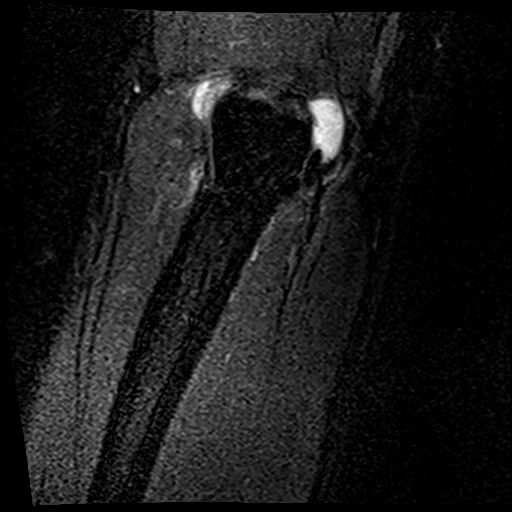
[im 11/26]
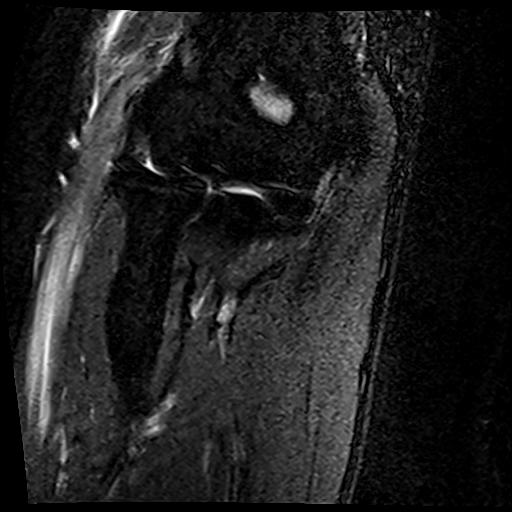
[im 16/26]
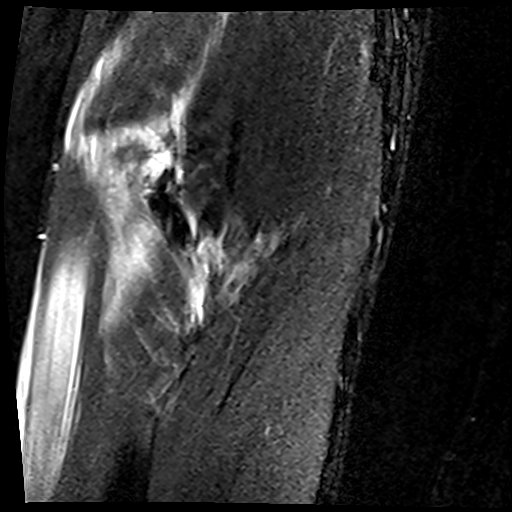
[im 21/26]
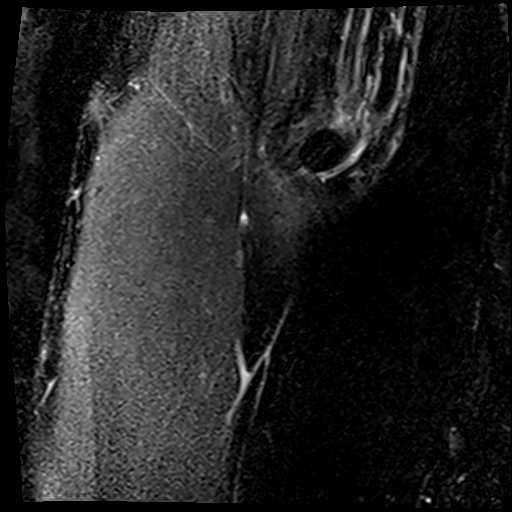
[im 26/26]
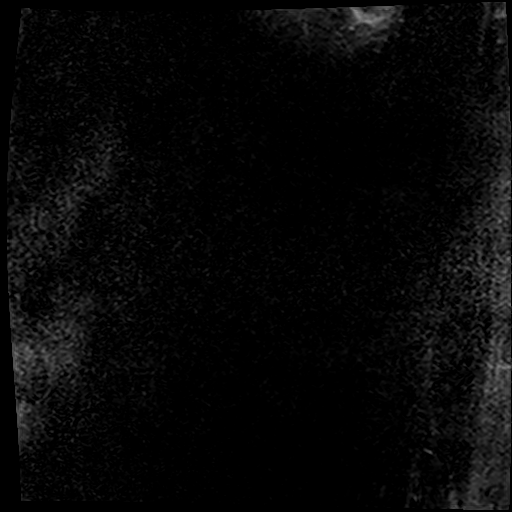

[Series 12: T2 fat-sat · sagittal · right · 3.0mm · 0.27mm/px · 4 of 34 slices shown (3 of 4)]
[im 1/34]
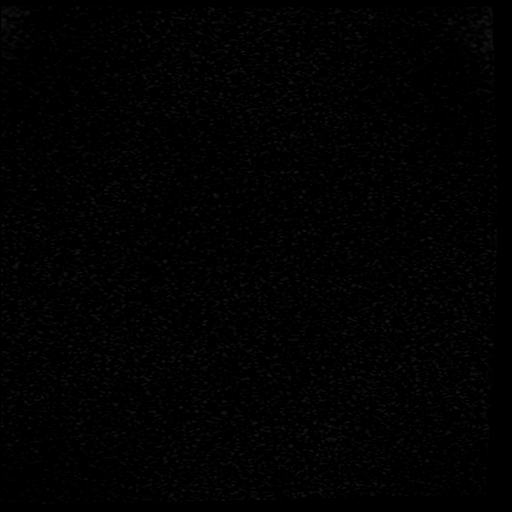
[im 5/34]
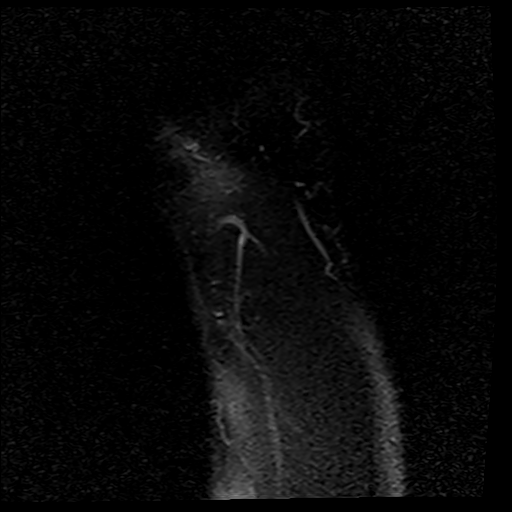
[im 19/34]
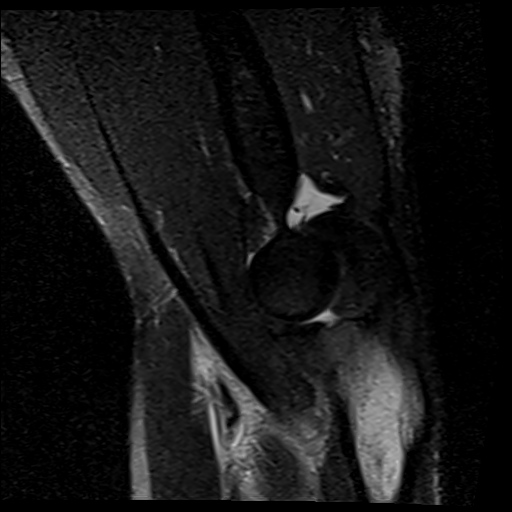
[im 29/34]
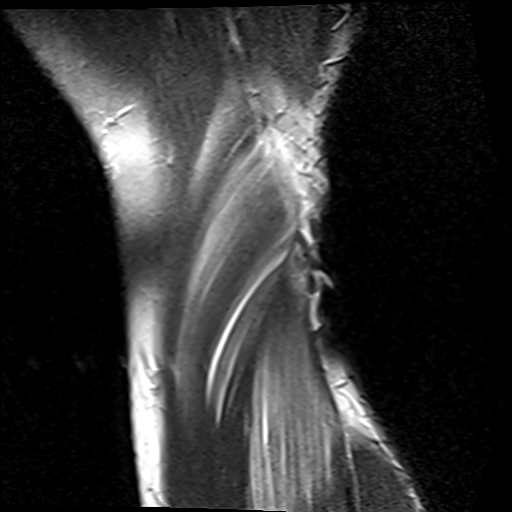

[Series 13: T2 fat-sat · coronal · right · 3.0mm · 0.27mm/px · 3 of 26 slices shown (4 of 4)]
[im 6/26]
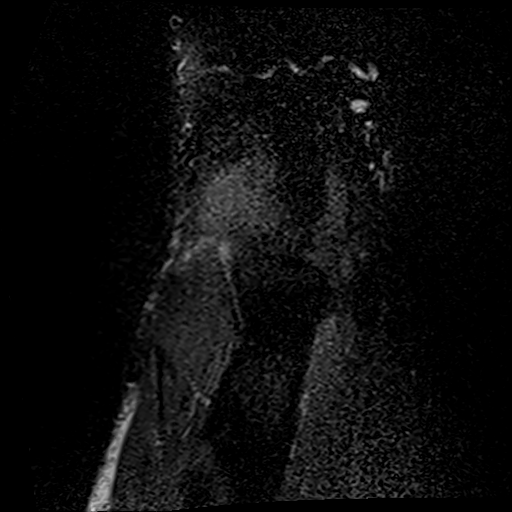
[im 16/26]
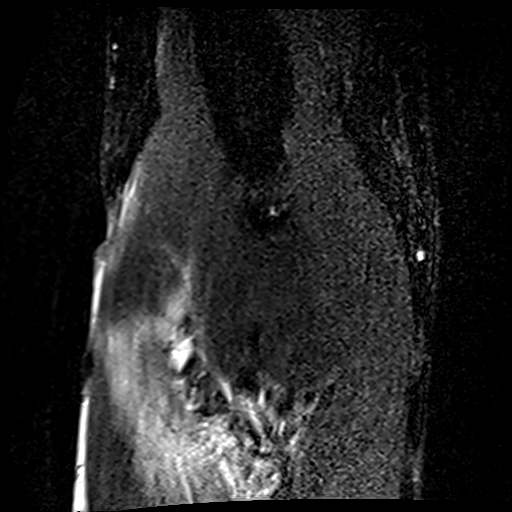
[im 26/26]
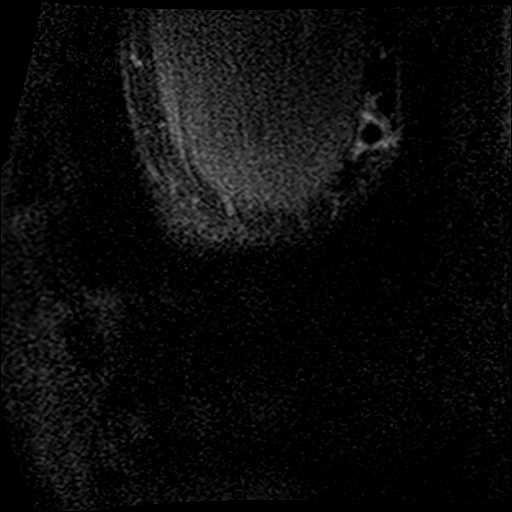

[19 of 40 positions shown; findings below may reference images not displayed]

FINDINGS: TENDONS

Common forearm flexor origin: Intact

Common forearm extensor origin: Common forearm extensor tendon
appears intact.

Biceps: Intact.

Triceps: Intact

LIGAMENTS

Medial stabilizers: Intact

Lateral stabilizers:  Intact

Cartilage: No chondral defect.

Joint: There is a small joint effusion.

Cubital tunnel: Normal.

Bones: No fracture or dislocation. No marrow abnormality.

Soft Tissues: There is extensive intramuscular edema within the
extensor carpi radialis and to a lesser degree the brachioradialis
muscle. There is mild superficial edema along the medial upper arm
along the dialysis fistula.
IMPRESSION: Nonspecific intramuscular edema within the extensor carpi radialis
and to a lesser degree the brachioradialis muscle. Could reflect
muscle injury or myositis.

Small joint effusion. No periarticular marrow abnormality or other
findings to suggest septic arthritis on MRI.

Mild superficial edema along the medial upper arm dialysis fistula.

## 2022-02-14 IMAGING — CR DG FOREARM 2V*R*
2 series · 2 of 2 positions shown · non-contrast
Comparison: None.

CLINICAL DATA: 35-year-old male with upper extremity pain at the
side of dialysis fistula.

EXAM:
RIGHT FOREARM - 2 VIEW

[x forearm ap right]
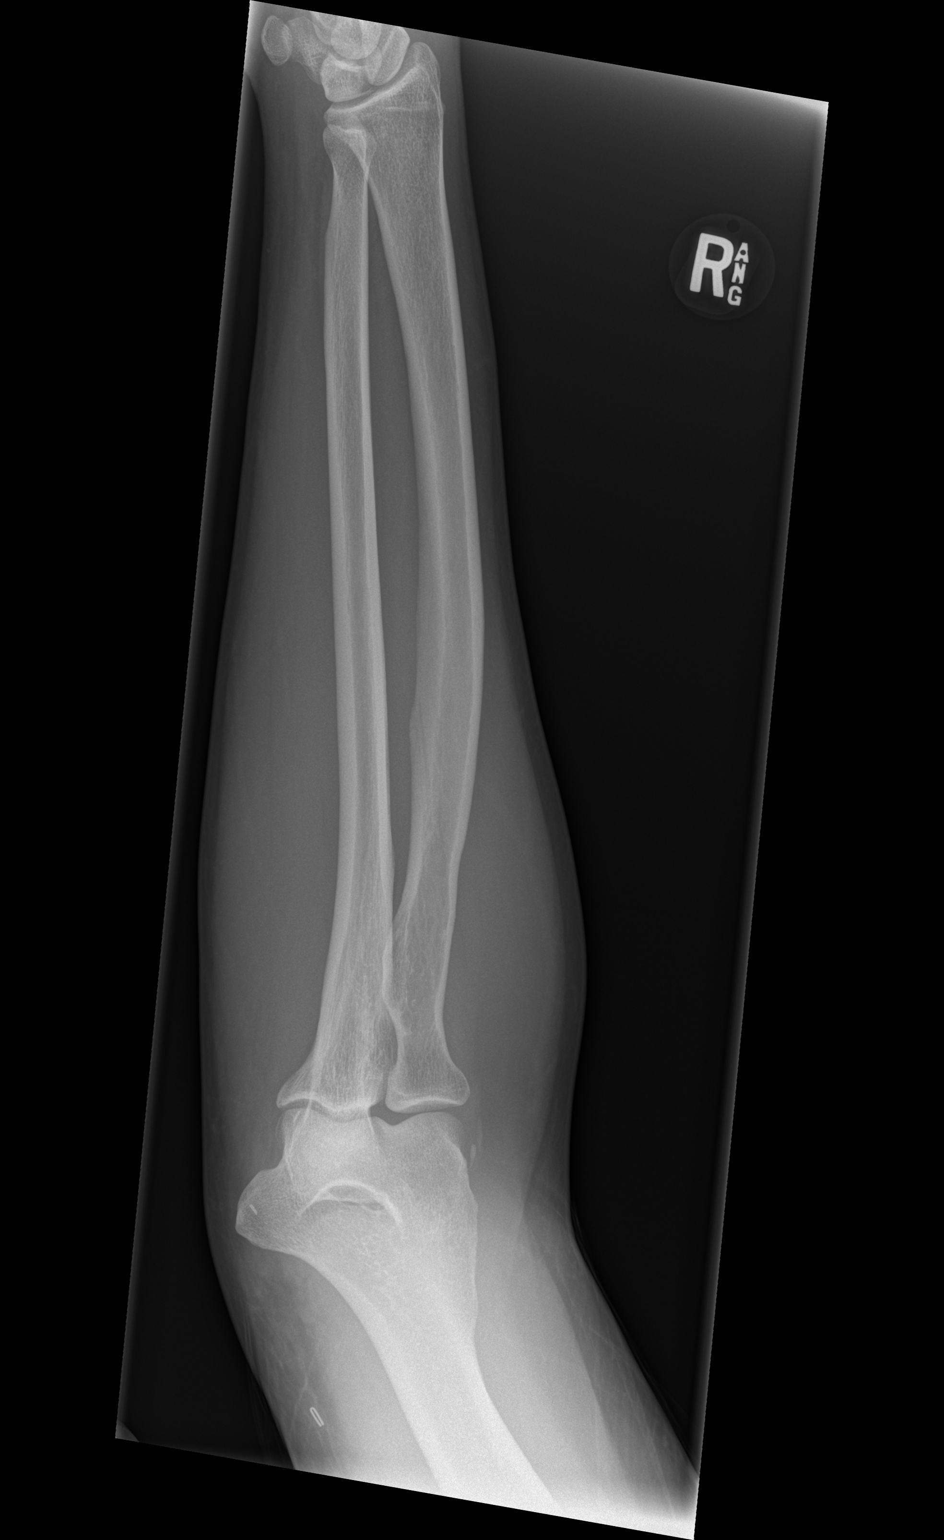

[x forearm lat right]
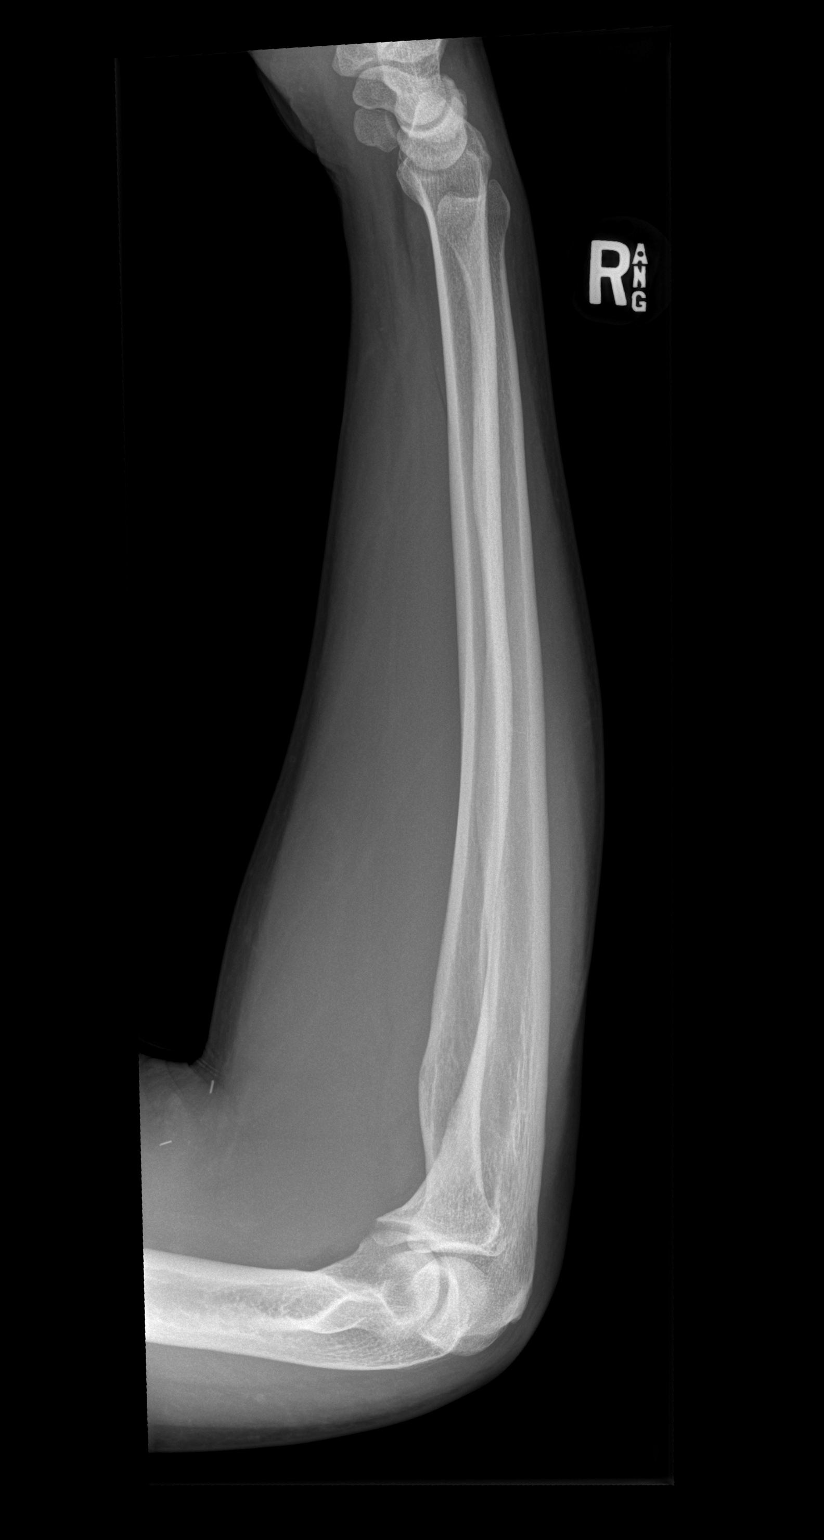

[2 of 2 positions shown; findings below may reference images not displayed]

FINDINGS: Surgical clips along the medial distal humerus in the soft tissues,
likely fistula related.

Bone mineralization is within normal limits. Alignment appears
preserved at the elbow and wrist. Radius and ulna intact. No acute
osseous abnormality identified.
IMPRESSION: No osseous abnormality identified in the forearm.

## 2022-02-14 MED ORDER — FENTANYL CITRATE PF 50 MCG/ML IJ SOSY: 50.0000 ug | PREFILLED_SYRINGE | Freq: Once | INTRAMUSCULAR | Status: DC

## 2022-02-14 MED ORDER — LIDOCAINE-EPINEPHRINE 2 %-1:100000 IJ SOLN
20.0000 mL | Freq: Once | INTRAMUSCULAR | Status: AC
  Administered 2022-02-14: 20 mL via INTRADERMAL
  Filled 2022-02-14: qty 1

## 2022-02-14 MED ORDER — LIDOCAINE-PRILOCAINE 2.5-2.5 % EX CREA: 1.0000 "application " | TOPICAL_CREAM | CUTANEOUS | Status: DC | PRN

## 2022-02-14 MED ORDER — QUETIAPINE FUMARATE 50 MG PO TABS: 50.0000 mg | ORAL_TABLET | ORAL | Status: DC

## 2022-02-14 MED ORDER — CHLORHEXIDINE GLUCONATE CLOTH 2 % EX PADS: 6.0000 | MEDICATED_PAD | Freq: Every day | CUTANEOUS | Status: DC

## 2022-02-14 MED ORDER — SODIUM CHLORIDE 0.9 % IV SOLN: 100.0000 mL | INTRAVENOUS | Status: DC | PRN

## 2022-02-14 MED ORDER — PROCHLORPERAZINE EDISYLATE 10 MG/2ML IJ SOLN
10.0000 mg | Freq: Four times a day (QID) | INTRAMUSCULAR | Status: DC | PRN
  Filled 2022-02-14: qty 2

## 2022-02-14 MED ORDER — CLONIDINE HCL 0.1 MG PO TABS
0.1000 mg | ORAL_TABLET | Freq: Three times a day (TID) | ORAL | Status: DC | PRN
  Administered 2022-02-14: 0.1 mg via ORAL
  Filled 2022-02-14: qty 1

## 2022-02-14 MED ORDER — CLONIDINE HCL 0.3 MG PO TABS
0.3000 mg | ORAL_TABLET | Freq: Two times a day (BID) | ORAL | Status: DC
  Administered 2022-02-14: 0.3 mg via ORAL
  Filled 2022-02-14: qty 3

## 2022-02-14 MED ORDER — METHYLPREDNISOLONE SODIUM SUCC 40 MG IJ SOLR
20.0000 mg | Freq: Two times a day (BID) | INTRAMUSCULAR | Status: DC
  Administered 2022-02-15: 20 mg via INTRAVENOUS
  Filled 2022-02-14: qty 1

## 2022-02-14 MED ORDER — ACETAMINOPHEN 500 MG PO TABS
1000.0000 mg | ORAL_TABLET | Freq: Once | ORAL | Status: AC
  Administered 2022-02-14: 1000 mg via ORAL
  Filled 2022-02-14: qty 2

## 2022-02-14 MED ORDER — HEPARIN SODIUM (PORCINE) 1000 UNIT/ML DIALYSIS: 1000.0000 [IU] | INTRAMUSCULAR | Status: DC | PRN

## 2022-02-14 MED ORDER — OXYCODONE HCL 5 MG PO TABS
10.0000 mg | ORAL_TABLET | Freq: Once | ORAL | Status: AC
  Administered 2022-02-14: 10 mg via ORAL
  Filled 2022-02-14: qty 2

## 2022-02-14 MED ORDER — ACETAMINOPHEN 650 MG RE SUPP: 650.0000 mg | Freq: Four times a day (QID) | RECTAL | Status: DC | PRN

## 2022-02-14 MED ORDER — CARVEDILOL 25 MG PO TABS: 25.0000 mg | ORAL_TABLET | Freq: Two times a day (BID) | ORAL | Status: DC

## 2022-02-14 MED ORDER — SEVELAMER CARBONATE 800 MG PO TABS: 3200.0000 mg | ORAL_TABLET | Freq: Three times a day (TID) | ORAL | Status: DC

## 2022-02-14 MED ORDER — SODIUM ZIRCONIUM CYCLOSILICATE 5 G PO PACK
5.0000 g | PACK | Freq: Once | ORAL | Status: AC
  Administered 2022-02-14: 5 g via ORAL
  Filled 2022-02-14: qty 1

## 2022-02-14 MED ORDER — HYDROMORPHONE HCL 1 MG/ML IJ SOLN
1.0000 mg | Freq: Four times a day (QID) | INTRAMUSCULAR | Status: DC | PRN
  Administered 2022-02-14 (×2): 1 mg via INTRAVENOUS
  Filled 2022-02-14 (×2): qty 1

## 2022-02-14 MED ORDER — CEFTRIAXONE SODIUM 2 G IJ SOLR
2.0000 g | Freq: Once | INTRAMUSCULAR | Status: AC
  Administered 2022-02-14: 2 g via INTRAVENOUS
  Filled 2022-02-14: qty 20

## 2022-02-14 MED ORDER — LOSARTAN POTASSIUM 50 MG PO TABS
100.0000 mg | ORAL_TABLET | Freq: Every day | ORAL | Status: DC
  Administered 2022-02-14: 100 mg via ORAL
  Filled 2022-02-14: qty 4

## 2022-02-14 MED ORDER — ACETAMINOPHEN 325 MG PO TABS
650.0000 mg | ORAL_TABLET | Freq: Four times a day (QID) | ORAL | Status: DC | PRN
  Administered 2022-02-14: 650 mg via ORAL
  Filled 2022-02-14: qty 2

## 2022-02-14 MED ORDER — HYDRALAZINE HCL 50 MG PO TABS
100.0000 mg | ORAL_TABLET | Freq: Three times a day (TID) | ORAL | Status: DC
  Filled 2022-02-14 (×2): qty 2

## 2022-02-14 MED ORDER — AMLODIPINE BESYLATE 5 MG PO TABS: 10.0000 mg | ORAL_TABLET | Freq: Every day | ORAL | Status: DC

## 2022-02-14 MED ORDER — QUETIAPINE FUMARATE 50 MG PO TABS
50.0000 mg | ORAL_TABLET | Freq: Once | ORAL | Status: AC
  Administered 2022-02-14: 50 mg via ORAL
  Filled 2022-02-14: qty 1

## 2022-02-14 MED ORDER — PENTAFLUOROPROP-TETRAFLUOROETH EX AERO: 1.0000 "application " | INHALATION_SPRAY | CUTANEOUS | Status: DC | PRN

## 2022-02-14 MED ORDER — LOSARTAN POTASSIUM 25 MG PO TABS: 100.0000 mg | ORAL_TABLET | Freq: Every day | ORAL | Status: DC

## 2022-02-14 MED ORDER — VANCOMYCIN VARIABLE DOSE PER UNSTABLE RENAL FUNCTION (PHARMACIST DOSING): Status: DC

## 2022-02-14 MED ORDER — LIDOCAINE HCL 1 % IJ SOLN
INTRAMUSCULAR | Status: AC
  Filled 2022-02-14: qty 20

## 2022-02-14 MED ORDER — CEFTRIAXONE SODIUM 1 G IJ SOLR
1.0000 g | INTRAMUSCULAR | Status: DC
Start: 2022-02-15 — End: 2022-02-15

## 2022-02-14 MED ORDER — LIDOCAINE HCL (PF) 1 % IJ SOLN: 5.0000 mL | INTRAMUSCULAR | Status: DC | PRN

## 2022-02-14 MED ORDER — VANCOMYCIN HCL 2000 MG/400ML IV SOLN
2000.0000 mg | Freq: Once | INTRAVENOUS | Status: AC
  Administered 2022-02-14: 2000 mg via INTRAVENOUS
  Filled 2022-02-14: qty 400

## 2022-02-14 NOTE — ED Notes (Signed)
Pt states he is "ready to be discharged." Hospitalist paged at this time.

## 2022-02-14 NOTE — ED Notes (Signed)
Pt refusing joint bursa drain at this time. Dr. Marylyn Ishihara aware.

## 2022-02-14 NOTE — ED Notes (Signed)
This nurse holding Coreg at this time d/t pt HR in the 40's - 50's. Verified with Pharmacy pt's ordered Clonidine here in the ED has been reconciled and okay to give.

## 2022-02-14 NOTE — Progress Notes (Signed)
Pharmacy Antibiotic Note  Douglas [name] is a 36 y.o. male admitted on 02/14/2022 with  concern for septic arthritis .  Pharmacy has been consulted for vancomycin dosing.  Patient has ESRD on HD. Missed HD session today.  Plan: Vancomycin 2000 mg LD given. Additional doses to be ordered post-HD sessions. Pharmacy to follow.   Goal vancomycin level: 15-25 mcg/mL  Height: 6\' 5"  (195.6 cm) Weight: 102.1 kg (225 lb) IBW/kg (Calculated) : 89.1  Temp (24hrs), Avg:98 F (36.7 C), Min:97.7 F (36.5 C), Max:98.2 F (36.8 C)  Recent Labs  Lab 02/14/22 0800  WBC 7.7  CREATININE 16.52*    Estimated Creatinine Clearance: 7.9 mL/min (A) (by C-G formula based on SCr of 16.52 mg/dL (H)).    Allergies  Allergen Reactions   Pork-Derived Products Nausea And Vomiting    Lenis Noon, PharmD 02/14/2022 7:13 PM

## 2022-02-14 NOTE — ED Notes (Signed)
Verified with Pharmacy Vancomycin okay to give despite pt's kidney function.

## 2022-02-14 NOTE — ED Notes (Addendum)
Pharmacy paged x3 regarding verifying pt's Hydralazine for his systolic BP of 355.

## 2022-02-14 NOTE — ED Triage Notes (Addendum)
Pt reports R forearm pain. No swelling noted. This is the side of his fistula, but he states, "its not the fistula." He states that it feels like the muscle. Worse with movement or rotation. Cap refill <2s. Placed in a sling for comfort.

## 2022-02-14 NOTE — Progress Notes (Signed)
Orthopedic Tech Progress Note Patient Details:  Douglas [name] January 15, 1986 [id number]  Ortho Devices Type of Ortho Device: Ace wrap, Post (long arm) splint, Arm sling Ortho Device/Splint Location: right Ortho Device/Splint Interventions: Application   Post Interventions Patient Tolerated: Well Instructions Provided: Care of device, Adjustment of device  [place] Pink 02/14/2022, 7:23 PM

## 2022-02-14 NOTE — ED Notes (Signed)
Pharmacy paged to reconcile pt's home BP medications at this time.

## 2022-02-14 NOTE — Progress Notes (Signed)
Notified by nursing that patient wanted to leave AMA.   I entered the room to discuss this decision with the patient. I informed him that we are still trying to investigate if he has infection in his joint. He states that he thinks his problem is getting better and that it was probably "just a perforated vein. It's happened before." I informed him that it was best if he allowed [place] to complete our work up and clear him of infection. I also informed him that his BP was dangerously high and that it was in his best interest that he allow [place] to use all our options available to treat it. I informed him that his HR prevents [place] from using certain medications, but their are others available that he is preventing [place] from using. I informed him that these blood pressures can result in stroke or worse. After discuss presenting him with this information, the patient:   Acknowledged and voiced understanding of the risks of not completing the work up for his elbow, including the possibility of septic arthritis and permanent injury to arm. He agreed to stay for antibiotics and completion of the work up. Acknowledged and voiced understanding of the risks of his high blood pressure; including the possibility of stroke and death. He informed me that although these things may happen, he is not worried about it. "My pressure is high after every dialysis session. High just like this, and it is because they stress me out and won't stop poking me." He continues to refuse the additions of CCB (amlodipine/nifedipine) or hydralazine to his regimen. He says "Don't worry about the blood pressure. That is normal for me."   I discussed his case with nephrology. From a BP mgmt position, increasing clonidine and watch HR is about all that we have left with the options that he is refusing. Additional PRN clonidine has been added.   I've discussed his case with his parents at bedside. They witnessed the exchange, he is A&O x 3, and they  realize at this point the patient still has decision making capacity.  Jonnie Finner, DO

## 2022-02-14 NOTE — Progress Notes (Signed)
A consult was received from an ED physician for vancomycin per pharmacy dosing.  The patient's profile has been reviewed for ht/wt/allergies/indication/available labs.    A one time order has been placed for vancomycin 2000 mg IV.  Further antibiotics/pharmacy consults should be ordered by admitting physician if indicated.                       Royetta Asal, PharmD, BCPS Clinical Pharmacist [place] Please utilize Amion for appropriate phone number to reach the unit pharmacist ([place]) 02/14/2022 10:11 AM

## 2022-02-14 NOTE — ED Notes (Signed)
This nurse discussed with Dr. Marylyn Ishihara, pt continues to decline narcotic pain medication, calcium-channel blockers, hydralazine, and Losartan. Pt's BP remains elevated. Will continue to monitor.

## 2022-02-14 NOTE — ED Notes (Signed)
This nurse paged Pharmacy 3x regarding reconciling this pt's BP medications. Per Merrilee Seashore, Pharmacist, pt's medications have been reconciled and verified. This nurse entered the pt's room to give the ordered Hydralazine however the pt states "no pharmacy tech reconciled his medications" and that he is unable to take Hydralazine d/t "leg swelling." This nurse went over pt's home BP medications with him. EDP, Dr. Armandina Gemma, notified and aware of pt's inability to take Hydralazine. EDP aware pt has received 0.3mg  Clonidine alone, the Coreg was held d/t pt's bradycardic HR, and BP remains elevated at 464'V systolic. No additional orders from EDP at this time. Will continue to monitor.
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
## 2022-02-14 NOTE — ED Notes (Signed)
Hospitalist at the bedside 

## 2022-02-14 NOTE — ED Notes (Signed)
Pt to MRI at this time.

## 2022-02-14 NOTE — Progress Notes (Signed)
Per med rec pt states that he no longer takes hydralazine and amlodipine.    Messaged Dr. Armandina Gemma whether med should be resumed. MD says hold med. Orders have been discontinued.   Royetta Asal, PharmD, BCPS 02/14/2022 9:36 AM

## 2022-02-14 NOTE — ED Notes (Signed)
Hospitalist, Dr. Marylyn Ishihara paged regarding pt's elevated BP.

## 2022-02-14 NOTE — ED Notes (Signed)
Pt back from MRI at this time

## 2022-02-14 NOTE — ED Provider Notes (Signed)
**Douglas Douglas** Douglas Douglas   CSN: [id number] Arrival date & time: 02/14/22  0551     History  Chief Complaint  Patient presents with   Arm Pain    Douglas Douglas is a 36 y.o. male.   Arm Pain   35 year old male with a history of COPD, ESRD on hemodialysis via right upper extremity AV fistula Tuesday, Thursday, Saturday who was due for dialysis today but has not gone due to right elbow pain.  The patient states he developed shooting right elbow pain, worse with range of motion and any attempts at movement.  Pain has worsened over the last 24 hours and is now severe.  He denies any history of gout.  He denies any fevers or chills.  He endorses some swelling and pain along the lateral aspect of the right elbow.  The patient denies any traumatic injury to the elbow.  Home Medications Prior to Admission medications   Medication Sig Start Date End Date Taking? Authorizing Provider  cholecalciferol (VITAMIN D3) 25 MCG (1000 UNIT) tablet Take 1,000 Units by mouth daily. On dialysis days   Yes [provider]  cloNIDine (CATAPRES) 0.3 MG tablet Take 1 tablet by mouth twice a day as directed Patient taking differently: Take 0.3 mg by mouth 3 (three) times daily. 12/20/21  Yes   ibuprofen (ADVIL) 200 MG tablet Take 200 mg by mouth every 6 (six) hours as needed for mild pain.   Yes [provider]  losartan (COZAAR) 100 MG tablet Take 1 tablet by mouth every night for high blood pressure 10/16/21  Yes   losartan (COZAAR) 50 MG tablet TAKE 1 TABLET BY MOUTH EVERY NIGHT FOR HIGH BLOOD PRESSURE 03/06/21 03/06/22 Yes Rexene Agent, MD  ondansetron (ZOFRAN-ODT) 4 MG disintegrating tablet Take 1 tablet (4 mg total) by mouth every 8 (eight) hours as needed for nausea or vomiting. 12/15/21  Yes Kerin Perna, NP  oxyCODONE-acetaminophen (PERCOCET) 5-325 MG tablet Take 1 tablet by mouth every 6 (six) hours as needed. 09/17/21  Yes Rhyne,  Hulen Shouts, PA-C  promethazine (PHENERGAN) 25 MG tablet Take 1 tablet (25 mg total) by mouth every 6 (six) hours as needed for nausea or vomiting. 09/30/21  Yes Drenda Freeze, MD  QUEtiapine (SEROQUEL) 50 MG tablet TAKE 1 TABLET (50 MG TOTAL) BY MOUTH AT BEDTIME. Patient taking differently: Take 50 mg by mouth at bedtime. 05/09/21 05/09/22 Yes Kerin Perna, NP  sevelamer carbonate (RENVELA) 800 MG tablet Take 4 tablet by mouth three times a day with meals and 3 tabs twice daily as needed with snacks Patient taking differently: Take 3,200 mg by mouth 3 (three) times daily with meals. 12/04/21  Yes   amLODipine (NORVASC) 10 MG tablet Take 1 tablet by mouth at bedtime Patient not taking: Reported on 02/14/2022 08/26/21     Ascorbic Acid (VITAMIN C PO) Take 1 tablet by mouth daily. Patient not taking: Reported on 02/14/2022    [provider]  carvedilol (COREG) 25 MG tablet Take 1 tablet (25 mg total) by mouth 2 (two) times daily with a meal. Patient not taking: Reported on 02/14/2022 05/09/21   Kerin Perna, NP  hydrALAZINE (APRESOLINE) 100 MG tablet TAKE 1 TABLET BY MOUTH THREE TIMES DAILY Patient not taking: Reported on 02/14/2022 05/09/21 05/09/22  Kerin Perna, NP  losartan (COZAAR) 100 MG tablet Take 1 tablet by mouth every evening Patient not taking: Reported on 02/14/2022 02/05/22  sevelamer carbonate (RENVELA) 800 MG tablet Take 4 tablet by mouth three times a day with meals and 3 tablets twice daily with snacks Patient not taking: Reported on 02/14/2022 12/20/21     valsartan (DIOVAN) 160 MG tablet Take 1 tablet by mouth every evening  For high blood pressure Patient not taking: Reported on 02/14/2022 11/18/21         Allergies    Pork-derived products    Review of Systems   Review of Systems  Musculoskeletal:  Positive for arthralgias and joint swelling.  All other systems reviewed and are negative.  Physical Exam Updated Vital Signs BP (!) 179/101     Pulse (!) 56    Temp 97.7 F (36.5 C) (Oral)    Resp 13    Ht 6' 5"  (1.956 m)    Wt 102.1 kg    SpO2 100%    BMI 26.68 kg/m  Physical Exam Vitals and nursing Douglas reviewed.  Constitutional:      Appearance: He is well-developed.     Comments: Uncomfortable appearing, holding right forearm/elbow in flexion  HENT:     Head: Normocephalic and atraumatic.  Eyes:     Conjunctiva/sclera: Conjunctivae normal.  Cardiovascular:     Rate and Rhythm: Normal rate and regular rhythm.     Pulses: Normal pulses.  Pulmonary:     Effort: Pulmonary effort is normal. No respiratory distress.     Breath sounds: Normal breath sounds.  Abdominal:     Palpations: Abdomen is soft.     Tenderness: There is no abdominal tenderness.  Musculoskeletal:        General: Swelling and tenderness present. No signs of injury.     Cervical back: Neck supple.     Comments: Pain with attempts at range of motion to include flexion and extension of the right elbow, tenderness palpation about the lateral epicondyles with some joint swelling appreciated.  Right upper extremity AV fistula in place, palpable thrill, audible bruit  Skin:    General: Skin is warm and dry.     Capillary Refill: Capillary refill takes less than 2 seconds.  Neurological:     Mental Status: He is alert.  Psychiatric:        Mood and Affect: Mood normal.    ED Results / Procedures / Treatments   Labs (all labs ordered are listed, but only abnormal results are displayed) Labs Reviewed  CBC WITH DIFFERENTIAL/PLATELET - Abnormal; Notable for the following components:      Result Value   RBC 3.47 (*)    Hemoglobin 10.8 (*)    HCT 34.0 (*)    RDW 17.2 (*)    All other components within normal limits  BASIC METABOLIC PANEL - Abnormal; Notable for the following components:   Sodium 133 (*)    Potassium 5.6 (*)    Chloride 92 (*)    Glucose, Bld 100 (*)    BUN 85 (*)    Creatinine, Ser 16.52 (*)    GFR, Estimated 3 (*)    All other  components within normal limits  BODY FLUID CULTURE W GRAM STAIN  CULTURE, BLOOD (SINGLE)  RESP PANEL BY RT-PCR (FLU A&B, COVID) ARPGX2  SEDIMENTATION RATE  C-REACTIVE PROTEIN    EKG None  Radiology DG Elbow Complete Right  Result Date: 02/14/2022 CLINICAL DATA:  36 year old male with upper extremity pain at the side of dialysis fistula. EXAM: RIGHT ELBOW - COMPLETE 3+ VIEW COMPARISON:  Forearm series today. FINDINGS: Probable fistula  related surgical clips in the medial soft tissues along the distal humerus, upper antecubital fossa. Bone mineralization is within normal limits. Distal humerus intact with small curvilinear dystrophic calcification along the lateral epicondyle. Joint spaces and alignment maintained. No evidence of joint effusion. Radial head appears intact. No acute osseous abnormality identified. IMPRESSION: 1. Dystrophic, possibly posttraumatic soft tissue calcifications along the lateral epicondyle. No acute osseous abnormality identified about the right elbow. 2. Probable fistula related surgical clips. Electronically Signed   By: Genevie Ann M.D.   On: 02/14/2022 07:23   DG Forearm Right  Result Date: 02/14/2022 CLINICAL DATA:  36 year old male with upper extremity pain at the side of dialysis fistula. EXAM: RIGHT FOREARM - 2 VIEW COMPARISON:  None. FINDINGS: Surgical clips along the medial distal humerus in the soft tissues, likely fistula related. Bone mineralization is within normal limits. Alignment appears preserved at the elbow and wrist. Radius and ulna intact. No acute osseous abnormality identified. IMPRESSION: No osseous abnormality identified in the forearm. Electronically Signed   By: Genevie Ann M.D.   On: 02/14/2022 07:21    Procedures .Joint Aspiration/Arthrocentesis  Date/Time: 02/14/2022 10:22 AM Performed by: Regan Lemming, MD Authorized by: Regan Lemming, MD   Consent:    Consent obtained:  Verbal and written   Consent given by:  Patient   Risks,  benefits, and alternatives were discussed: yes     Risks discussed:  Bleeding, infection, pain and incomplete drainage   Alternatives discussed:  No treatment and delayed treatment Universal protocol:    Patient identity confirmed:  Verbally with patient Location:    Location:  Elbow   Elbow:  R elbow Anesthesia:    Anesthesia method:  Local infiltration   Local anesthetic:  Lidocaine 1% WITH epi Procedure details:    Preparation: Patient was prepped and draped in usual sterile fashion     Needle gauge:  18 G   Ultrasound guidance: yes     Approach:  Lateral   Aspirate amount:  2cc   Aspirate characteristics:  Blood-tinged   Steroid injected: no     Specimen collected: yes   Post-procedure details:    Dressing:  Adhesive bandage   Procedure completion:  Tolerated    Medications Ordered in ED Medications  fentaNYL (SUBLIMAZE) injection 50 mcg (0 mcg Intravenous Hold 02/14/22 0833)  carvedilol (COREG) tablet 25 mg (0 mg Oral Hold 02/14/22 0839)  cloNIDine (CATAPRES) tablet 0.3 mg (0.3 mg Oral Given 02/14/22 0900)  losartan (COZAAR) tablet 100 mg (has no administration in time range)  vancomycin (VANCOREADY) IVPB 2000 mg/400 mL (has no administration in time range)  sodium zirconium cyclosilicate (LOKELMA) packet 5 g (has no administration in time range)  lidocaine-EPINEPHrine (XYLOCAINE W/EPI) 2 %-1:100000 (with pres) injection 20 mL (20 mLs Intradermal Given 02/14/22 0859)  acetaminophen (TYLENOL) tablet 1,000 mg (1,000 mg Oral Given 02/14/22 0930)  oxyCODONE (Oxy IR/ROXICODONE) immediate release tablet 10 mg (10 mg Oral Given 02/14/22 1054)  cefTRIAXone (ROCEPHIN) 2 g in sodium chloride 0.9 % 100 mL IVPB (2 g Intravenous New Bag/Given 02/14/22 1055)    ED Course/ Medical Decision Making/ A&P                           Medical Decision Making Amount and/or Complexity of Data Reviewed Labs: ordered. Radiology: ordered.  Risk OTC drugs. Prescription drug management. Decision  regarding hospitalization.   36 year old male with a history of COPD, ESRD on hemodialysis via right upper  extremity AV fistula Tuesday, Thursday, Saturday who was due for dialysis today but has not gone due to right elbow pain.  The patient states he developed shooting right elbow pain, worse with range of motion and any attempts at movement.  Pain has worsened over the last 24 hours and is now severe.  He denies any history of gout.  He denies any fevers or chills.  He endorses some swelling and pain along the lateral aspect of the right elbow.  The patient denies any traumatic injury to the elbow.  On arrival, the patient was vitally stable.  Temperature 98.2, pulse 58, respiratory rate 20, BP 171/25, O2 sats 100% on room air.  Sinus bradycardia noted on cardiac telemetry.  The patient presents with right elbow pain that is worsened over the past 2 days with associated swelling and tenderness along the lateral aspect of the elbow joint with pain with range of motion.  Concern for possible septic arthritis versus gout.  X-ray imaging was obtained which revealed no evidence of acute fracture of the forearm or right elbow.   Physical exam significant for:  Pain with attempts at range of motion to include flexion and extension of the right elbow, tenderness palpation about the lateral epicondyles with some joint swelling appreciated.  Right upper extremity AV fistula in place, palpable thrill, audible bruit  Imaging of the right elbow revealed dystrophic, possibly posttraumatic soft tissue calcifications along the lateral epicondyles, no acute osseous abnormality identified in the right elbow, probable fistula related surgical clips present, no acute fracture or malalignment of the right forearm.  Arthrocentesis was performed as per the procedure Douglas above but unfortunately scant fluid was aspirated.  I did speak with Dr. Mardelle Matte of orthopedics who stated that given the patient's symptoms, there is some  concern for possible septic arthritis.  He would benefit from further arthrocentesis and recommended IR consultation for further evaluation.  I did speak with on-call interventional radiology who recommended further imaging with MRI prior to attempting repeat arthrocentesis.  MRI of the right elbow ordered.  The patient's screening laboratory work-up was significant for mild hyperkalemia to 5.6 in the setting of the patient's missed dialysis with an elevated BUN to 8 5 and a creatinine of 16.52.  An EKG was ordered.  The patient was administered Lokelma.  He was covered for possible septic arthritis with Rocephin and vancomycin.  He was hypertensive in the emergency department without chest pain, shortness of breath or respiratory distress.  He had no vision changes.  He was administered his home antihypertensive with subsequent improvement to 179/101 from blood pressures ranging in the 372B systolic.  A CBC revealed no leukocytosis and an ESR was normal and a CRP was unremarkable. Patient will benefit from nephrology consultation as he did miss dialysis earlier today with a plan for potential dialysis later in the day. Orthopedics did recommend admission for further evaluation and management to the hospital service of Dr. Marylyn Ishihara of hospitalist medicine was consulted and accepted the patient in admission.     Final Clinical Impression(s) / ED Diagnoses Final diagnoses:  Right elbow pain  Hyperkalemia    Rx / DC Orders ED Discharge Orders     None         Regan Lemming, MD 02/14/22 1130

## 2022-02-14 NOTE — ED Notes (Signed)
Pt declining narcotic pain medications. EDP at bedside and aware. Per EDP, Dr. Jake Shark verbal order, 1,000mg  Tylenol PO to be ordered for pt at this time. Pt agreeable.

## 2022-02-14 NOTE — ED Notes (Signed)
EDP Dr. Armandina Gemma re-paged regarding pt's sustained elevated BP at this time. No additional orders from EDP at this time. Will continue to monitor.

## 2022-02-14 NOTE — ED Notes (Signed)
EDP at the bedside for arthrocentesis.

## 2022-02-14 NOTE — Progress Notes (Signed)
Brief Nephrology Note  Patient arrived with concerns for possible septic arthritis vs bursitis. Missed HD today. Hypertensive with some hyperkalemia. Lokelma given.  We will dialyze the patient once he arrives to [facility] and see him formally at that time. Although it appears he is considering leaving AMA. Will follow. Dialysis ordered.

## 2022-02-14 NOTE — ED Notes (Addendum)
EDP notified pt's HR intermittently bradying down into the 40's - 50's, pt sleeping this time but awakens to voice. Per the pt's Mother at the bedside, pt took a Seroquel this morning which he normally takes prior to dialysis at 5am on Saturday mornings. EDP additionally notified pt's BP remains elevated.

## 2022-02-14 NOTE — Progress Notes (Signed)
Patient ID: Douglas [name], male   DOB: [birth year], 36 y.o.   MRN: [id number] IR was consulted for right elbow joint aspiration.  Reviewed MRI and planned to perform image guided aspiration.  However, patient refused the procedure.  IR is available if patient changes his mind about the procedure.
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
## 2022-02-15 LAB — CBC
HCT: 31.7 % — ABNORMAL LOW (ref 39.0–52.0)
Hemoglobin: 10.4 g/dL — ABNORMAL LOW (ref 13.0–17.0)
MCH: 31.7 pg (ref 26.0–34.0)
MCHC: 32.8 g/dL (ref 30.0–36.0)
MCV: 96.6 fL (ref 80.0–100.0)
Platelets: 205 10*3/uL (ref 150–400)
RBC: 3.28 MIL/uL — ABNORMAL LOW (ref 4.22–5.81)
RDW: 17.1 % — ABNORMAL HIGH (ref 11.5–15.5)
WBC: 7.8 10*3/uL (ref 4.0–10.5)
nRBC: 0 % (ref 0.0–0.2)

## 2022-02-15 LAB — COMPREHENSIVE METABOLIC PANEL
ALT: 8 U/L (ref 0–44)
AST: 12 U/L — ABNORMAL LOW (ref 15–41)
Albumin: 3.7 g/dL (ref 3.5–5.0)
Alkaline Phosphatase: 63 U/L (ref 38–126)
Anion gap: 22 — ABNORMAL HIGH (ref 5–15)
BUN: 84 mg/dL — ABNORMAL HIGH (ref 6–20)
CO2: 22 mmol/L (ref 22–32)
Calcium: 9.1 mg/dL (ref 8.9–10.3)
Chloride: 90 mmol/L — ABNORMAL LOW (ref 98–111)
Creatinine, Ser: 18.29 mg/dL — ABNORMAL HIGH (ref 0.61–1.24)
GFR, Estimated: 3 mL/min — ABNORMAL LOW (ref >60)
Glucose, Bld: 82 mg/dL (ref 70–99)
Potassium: 4.9 mmol/L (ref 3.5–5.1)
Sodium: 134 mmol/L — ABNORMAL LOW (ref 135–145)
Total Bilirubin: 0.7 mg/dL (ref 0.3–1.2)
Total Protein: 6.9 g/dL (ref 6.5–8.1)

## 2022-02-15 LAB — HEPATITIS B SURFACE ANTIBODY,QUALITATIVE: Hep B S Ab: REACTIVE — AB

## 2022-02-15 LAB — HIV ANTIBODY (ROUTINE TESTING W REFLEX): HIV Screen 4th Generation wRfx: NONREACTIVE

## 2022-02-15 MED ORDER — CLONIDINE HCL 0.3 MG PO TABS
0.3000 mg | ORAL_TABLET | Freq: Two times a day (BID) | ORAL | Status: DC
  Administered 2022-02-15: 0.3 mg via ORAL
  Filled 2022-02-15: qty 1

## 2022-02-15 MED ORDER — HYDRALAZINE HCL 20 MG/ML IJ SOLN: 10.0000 mg | INTRAMUSCULAR | Status: DC | PRN

## 2022-02-15 MED ORDER — HYDROMORPHONE HCL 1 MG/ML IJ SOLN
0.5000 mg | INTRAMUSCULAR | Status: DC | PRN
  Administered 2022-02-15: 1 mg via INTRAVENOUS
  Filled 2022-02-15: qty 1

## 2022-02-15 NOTE — Plan of Care (Signed)
Pt arrived via stretcher by [facility]. Pt moved from stretcher to bed by staff. Vitals obtained and assessment done, see flowsheets. Pt oriented to staff and unit.   Problem: Education: Goal: Knowledge of General Education information will improve Description: Including pain rating scale, medication(s)/side effects and non-pharmacologic comfort measures Outcome: Progressing   Problem: Clinical Measurements: Goal: Ability to maintain clinical measurements within normal limits will improve Outcome: Progressing   Problem: Pain Managment: Goal: General experience of comfort will improve Outcome: Progressing   Problem: Skin Integrity: Goal: Risk for impaired skin integrity will decrease Outcome: Progressing

## 2022-02-15 NOTE — Progress Notes (Signed)
Brief nephrology note  Patient received dialysis overnight.  Tolerated well.  Patient was seen and examined.  Patient planning to leave AMA so will not do formal consult.  Recommended that he go to dialysis per his regular schedule

## 2022-02-15 NOTE — Progress Notes (Signed)
Refused all his hypertensive medication.MD made aware.
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
## 2022-02-15 NOTE — Progress Notes (Signed)
Called into patient's room.Patient requested to have his PIV removed.Nurse explained to him the purpose of it ,in case he needed a pain medicine within the time frame allowed as prescribed by M.D.Patient said '' I dont need a pin medicine right now and I did not come here to be connected to anything,for these I am going home.Nurse reminded the patient that M.D. is aware of him but it might take time to come to see him.Patient's mother tried to have his son wait for the M.D but the patient said'' look Mom ,I m getting better,I can move my arm now,patient flex his right arm in from of [place] several times. Patient asked for AMA paper to sign.

## 2022-02-15 NOTE — Discharge Summary (Signed)
Douglas [name] QQI:[id number] DOB: [birth year] DOA: 02/14/2022  PCP: Kerin Perna, NP  Admit date: 02/14/2022 Discharge date: 02/15/2022  Admitted From: Home Disposition:  left AMA before even seeing the physician   Brief/Interim Summary:  HER:DEYCX Douglas [name] is a 36 y.o. male with medical history significant of ESRD on HD T/Th/S, HTN. Presenting with right elbow pain. First noticed his pain 2 days ago after his dialysis session. He denies any trauma to the arm. He didn't notice any abnormal swelling, pus, or drainage. He didn't have any fevers. He denies a history of gout. He didn't try any medicine at home for the pain. However, the pain did intensify and persist through this morning. He was trying to get ready for his dialysis session this morning, but was unable to deal with it any more. He became concerned and came to the ED for assistance. He denies any other aggravating or alleviating factors.   He was transferred to [facility].  Orthopedics and nephrology were consulted.  He apparently received dialysis overnight.  MRI was completed see results below.  In the morning he refused all antihypertensive medications and signed out AMA before even waiting for attending to see him.      Discharge Diagnoses:  Principal Problem:   Right elbow pain Active Problems:   Hypertensive renal disease, malignant, with renal failure   Hyperkalemia   Anemia due to chronic kidney disease   ESRD on hemodialysis (HCC)   Marijuana abuse    Discharge Instructions     Allergies  Allergen Reactions   Pork-Derived Products Nausea And Vomiting    Consultations: Orthopedics   Procedures/Studies: DG Elbow Complete Right  Result Date: 02/14/2022 CLINICAL DATA:  36 year old male with upper extremity pain at the side of dialysis fistula. EXAM: RIGHT ELBOW - COMPLETE 3+ VIEW COMPARISON:  Forearm series today. FINDINGS: Probable fistula related surgical clips in the medial soft  tissues along the distal humerus, upper antecubital fossa. Bone mineralization is within normal limits. Distal humerus intact with small curvilinear dystrophic calcification along the lateral epicondyle. Joint spaces and alignment maintained. No evidence of joint effusion. Radial head appears intact. No acute osseous abnormality identified. IMPRESSION: 1. Dystrophic, possibly posttraumatic soft tissue calcifications along the lateral epicondyle. No acute osseous abnormality identified about the right elbow. 2. Probable fistula related surgical clips. Electronically Signed   By: Genevie Ann M.D.   On: 02/14/2022 07:23   DG Forearm Right  Result Date: 02/14/2022 CLINICAL DATA:  36 year old male with upper extremity pain at the side of dialysis fistula. EXAM: RIGHT FOREARM - 2 VIEW COMPARISON:  None. FINDINGS: Surgical clips along the medial distal humerus in the soft tissues, likely fistula related. Bone mineralization is within normal limits. Alignment appears preserved at the elbow and wrist. Radius and ulna intact. No acute osseous abnormality identified. IMPRESSION: No osseous abnormality identified in the forearm. Electronically Signed   By: Genevie Ann M.D.   On: 02/14/2022 07:21   MR ELBOW RIGHT WO CONTRAST  Result Date: 02/14/2022 CLINICAL DATA:  Septic arthritis suspected, elbow, xray done Concern for septic arthritis EXAM: MRI OF THE RIGHT ELBOW WITHOUT CONTRAST TECHNIQUE: Multiplanar, multisequence MR imaging of the elbow was performed. No intravenous contrast was administered. COMPARISON:  Same day elbow radiograph FINDINGS: TENDONS Common forearm flexor origin: Intact Common forearm extensor origin: Common forearm extensor tendon appears intact. Biceps: Intact. Triceps: Intact LIGAMENTS Medial stabilizers: Intact Lateral stabilizers:  Intact Cartilage: No chondral defect. Joint: There is a small joint effusion.  Cubital tunnel: Normal. Bones: No fracture or dislocation. No marrow abnormality. Soft Tissues:  There is extensive intramuscular edema within the extensor carpi radialis and to a lesser degree the brachioradialis muscle. There is mild superficial edema along the medial upper arm along the dialysis fistula. IMPRESSION: Nonspecific intramuscular edema within the extensor carpi radialis and to a lesser degree the brachioradialis muscle. Could reflect muscle injury or myositis. Small joint effusion. No periarticular marrow abnormality or other findings to suggest septic arthritis on MRI. Mild superficial edema along the medial upper arm dialysis fistula. Electronically Signed   By: Maurine Simmering M.D.   On: 02/14/2022 14:11      Subjective:  Unable to obtain Discharge Exam: Vitals:   02/15/22 0535 02/15/22 0653  BP: (!) 239/136 (!) 230/134  Pulse: 66 88  Resp: 16 16  Temp: 98.9 F (37.2 C) 98.7 F (37.1 C)  SpO2: 99% 97%   Vitals:   02/15/22 0428 02/15/22 0457 02/15/22 0535 02/15/22 0653  BP: (!) 234/142 (!) 192/133 (!) 239/136 (!) 230/134  Pulse: 90 85 66 88  Resp: 19 18 16 16   Temp: 98.8 F (37.1 C) 99.4 F (37.4 C) 98.9 F (37.2 C) 98.7 F (37.1 C)  TempSrc: Oral Oral  Oral  SpO2: 99% 98% 99% 97%  Weight: 97.9 kg     Height:       No exam as pt left ama   The results of significant diagnostics from this hospitalization (including imaging, microbiology, ancillary and laboratory) are listed below for reference.     Microbiology: Recent Results (from the past 240 hour(s))  Body fluid culture w Gram Stain     Status: None (Preliminary result)   Collection Time: 02/14/22  9:32 AM   Specimen: Synovium; Body Fluid  Result Value Ref Range Status   Specimen Description   Final    SYNOVIAL Performed at [facility] [address] [place], [place] [address]    Special Requests   Final    RIGHT ELBOW Performed at [facility] [address], [place], [place] [address]    Gram Stain   Final    FEW WBC PRESENT,BOTH PMN AND  MONONUCLEAR NO ORGANISMS SEEN Performed at [facility], [place] [address], [place], [place] [address]    Culture PENDING  Incomplete   Report Status PENDING  Incomplete  Culture, blood (single)     Status: None (Preliminary result)   Collection Time: 02/14/22  9:45 AM   Specimen: BLOOD  Result Value Ref Range Status   Specimen Description   Final    BLOOD LEFT ANTECUBITAL Performed at [facility] [address] [place], [place] [address]    Special Requests   Final    BOTTLES DRAWN AEROBIC ONLY Blood Culture results may not be optimal due to an inadequate volume of blood received in culture bottles Performed at [facility] [address] [place], [place] [address]    Culture   Final    NO GROWTH < 24 HOURS Performed at [facility] [address], [place], [place] [address]    Report Status PENDING  Incomplete  Resp Panel by RT-PCR (Flu A&B, Covid) Nasopharyngeal Swab     Status: None   Collection Time: 02/14/22 11:55 AM   Specimen: Nasopharyngeal Swab; Nasopharyngeal(NP) swabs in vial transport medium  Result Value Ref Range Status   SARS Coronavirus 2 by RT PCR NEGATIVE NEGATIVE Final  Comment: (NOTE) SARS-CoV-2 target nucleic acids are NOT DETECTED.  The SARS-CoV-2 RNA is generally detectable in upper respiratory specimens during the acute phase of infection. The lowest concentration of SARS-CoV-2 viral copies this assay can detect is 138 copies/mL. A negative result does not preclude SARS-Cov-2 infection and should not be used as the sole basis for treatment or other patient management decisions. A negative result may occur with  improper specimen collection/handling, submission of specimen other than nasopharyngeal swab, presence of viral mutation(s) within the areas targeted by this assay, and inadequate number of viral copies(<138 copies/mL). A negative result must be combined with clinical observations, patient  history, and epidemiological information. The expected result is Negative.  Fact Sheet for Patients:  EntrepreneurPulse.com.au  Fact Sheet for Healthcare Providers:  IncredibleEmployment.be  This test is no t yet approved or cleared by the [place] FDA and  has been authorized for detection and/or diagnosis of SARS-CoV-2 by FDA under an Emergency Use Authorization (EUA). This EUA will remain  in effect (meaning this test can be used) for the duration of the COVID-19 declaration under Section 564(b)(1) of the Act, 21 U.S.C.section 360bbb-3(b)(1), unless the authorization is terminated  or revoked sooner.       Influenza A by PCR NEGATIVE NEGATIVE Final   Influenza B by PCR NEGATIVE NEGATIVE Final    Comment: (NOTE) The Xpert Xpress SARS-CoV-2/FLU/RSV plus assay is intended as an aid in the diagnosis of influenza from Nasopharyngeal swab specimens and should not be used as a sole basis for treatment. Nasal washings and aspirates are unacceptable for Xpert Xpress SARS-CoV-2/FLU/RSV testing.  Fact Sheet for Patients: EntrepreneurPulse.com.au  Fact Sheet for Healthcare Providers: IncredibleEmployment.be  This test is not yet approved or cleared by the [place] FDA and has been authorized for detection and/or diagnosis of SARS-CoV-2 by FDA under an Emergency Use Authorization (EUA). This EUA will remain in effect (meaning this test can be used) for the duration of the COVID-19 declaration under Section 564(b)(1) of the Act, 21 U.S.C. section 360bbb-3(b)(1), unless the authorization is terminated or revoked.  Performed at [facility], [place] [address] [place], [place] [address]      Labs: BNP (last 3 results) No results for input(s): BNP in the last 8760 hours. Basic Metabolic Panel: Recent Labs  Lab 02/14/22 0800 02/15/22 0228  NA 133* 134*  K 5.6* 4.9  CL 92* 90*   CO2 26 22  GLUCOSE 100* 82  BUN 85* 84*  CREATININE 16.52* 18.29*  CALCIUM 9.9 9.1   Liver Function Tests: Recent Labs  Lab 02/15/22 0228  AST 12*  ALT 8  ALKPHOS 63  BILITOT 0.7  PROT 6.9  ALBUMIN 3.7   No results for input(s): LIPASE, AMYLASE in the last 168 hours. No results for input(s): AMMONIA in the last 168 hours. CBC: Recent Labs  Lab 02/14/22 0800 02/15/22 0228  WBC 7.7 7.8  NEUTROABS 5.4  --   HGB 10.8* 10.4*  HCT 34.0* 31.7*  MCV 98.0 96.6  PLT 206 205   Cardiac Enzymes: No results for input(s): CKTOTAL, CKMB, CKMBINDEX, TROPONINI in the last 168 hours. BNP: Invalid input(s): POCBNP CBG: No results for input(s): GLUCAP in the last 168 hours. D-Dimer No results for input(s): DDIMER in the last 72 hours. Hgb A1c No results for input(s): HGBA1C in the last 72 hours. Lipid Profile No results for input(s): CHOL, HDL, LDLCALC, TRIG, CHOLHDL, LDLDIRECT in the last 72 hours. Thyroid function studies No results for input(s):  TSH, T4TOTAL, T3FREE, THYROIDAB in the last 72 hours.  Invalid input(s): FREET3 Anemia work up No results for input(s): VITAMINB12, FOLATE, FERRITIN, TIBC, IRON, RETICCTPCT in the last 72 hours. Urinalysis    Component Value Date/Time   COLORURINE YELLOW 09/30/2021 0255   APPEARANCEUR CLEAR 09/30/2021 0255   LABSPEC 1.010 09/30/2021 0255   PHURINE 9.0 (H) 09/30/2021 0255   GLUCOSEU 50 (A) 09/30/2021 0255   HGBUR NEGATIVE 09/30/2021 0255   BILIRUBINUR NEGATIVE 09/30/2021 0255   KETONESUR NEGATIVE 09/30/2021 0255   PROTEINUR 100 (A) 09/30/2021 0255   UROBILINOGEN 1.0 01/25/2010 0642   NITRITE NEGATIVE 09/30/2021 0255   LEUKOCYTESUR NEGATIVE 09/30/2021 0255   Sepsis Labs Invalid input(s): PROCALCITONIN,  WBC,  LACTICIDVEN Microbiology Recent Results (from the past 240 hour(s))  Body fluid culture w Gram Stain     Status: None (Preliminary result)   Collection Time: 02/14/22  9:32 AM   Specimen: Synovium; Body Fluid  Result  Value Ref Range Status   Specimen Description   Final    SYNOVIAL Performed at [facility] [address], [place], [place] [address]    Special Requests   Final    RIGHT ELBOW Performed at [facility] [address], [place], [place] [address]    Gram Stain   Final    FEW WBC PRESENT,BOTH PMN AND MONONUCLEAR NO ORGANISMS SEEN Performed at [facility], [place] [address] [place], [place] [address]    Culture PENDING  Incomplete   Report Status PENDING  Incomplete  Culture, blood (single)     Status: None (Preliminary result)   Collection Time: 02/14/22  9:45 AM   Specimen: BLOOD  Result Value Ref Range Status   Specimen Description   Final    BLOOD LEFT ANTECUBITAL Performed at [facility] [address] [place], [place] [address]    Special Requests   Final    BOTTLES DRAWN AEROBIC ONLY Blood Culture results may not be optimal due to an inadequate volume of blood received in culture bottles Performed at [facility] [address], [place], [place] [address]    Culture   Final    NO GROWTH < 24 HOURS Performed at [facility] [address] [place], [place] [address]    Report Status PENDING  Incomplete  Resp Panel by RT-PCR (Flu A&B, Covid) Nasopharyngeal Swab     Status: None   Collection Time: 02/14/22 11:55 AM   Specimen: Nasopharyngeal Swab; Nasopharyngeal(NP) swabs in vial transport medium  Result Value Ref Range Status   SARS Coronavirus 2 by RT PCR NEGATIVE NEGATIVE Final    Comment: (NOTE) SARS-CoV-2 target nucleic acids are NOT DETECTED.  The SARS-CoV-2 RNA is generally detectable in upper respiratory specimens during the acute phase of infection. The lowest concentration of SARS-CoV-2 viral copies this assay can detect is 138 copies/mL. A negative result does not preclude SARS-Cov-2 infection and should not be used as the sole basis for treatment or other patient  management decisions. A negative result may occur with  improper specimen collection/handling, submission of specimen other than nasopharyngeal swab, presence of viral mutation(s) within the areas targeted by this assay, and inadequate number of viral copies(<138 copies/mL). A negative result must be combined with clinical observations, patient history, and epidemiological information. The expected result is Negative.  Fact Sheet for Patients:  EntrepreneurPulse.com.au  Fact Sheet for Healthcare Providers:  IncredibleEmployment.be  This test is no t yet approved or  cleared by the [place] and  has been authorized for detection and/or diagnosis of SARS-CoV-2 by FDA under an Emergency Use Authorization (EUA). This EUA will remain  in effect (meaning this test can be used) for the duration of the COVID-19 declaration under Section 564(b)(1) of the Act, 21 U.S.C.section 360bbb-3(b)(1), unless the authorization is terminated  or revoked sooner.       Influenza A by PCR NEGATIVE NEGATIVE Final   Influenza B by PCR NEGATIVE NEGATIVE Final    Comment: (NOTE) The Xpert Xpress SARS-CoV-2/FLU/RSV plus assay is intended as an aid in the diagnosis of influenza from Nasopharyngeal swab specimens and should not be used as a sole basis for treatment. Nasal washings and aspirates are unacceptable for Xpert Xpress SARS-CoV-2/FLU/RSV testing.  Fact Sheet for Patients: EntrepreneurPulse.com.au  Fact Sheet for Healthcare Providers: IncredibleEmployment.be  This test is not yet approved or cleared by the [place] FDA and has been authorized for detection and/or diagnosis of SARS-CoV-2 by FDA under an Emergency Use Authorization (EUA). This EUA will remain in effect (meaning this test can be used) for the duration of the COVID-19 declaration under Section 564(b)(1) of the Act, 21 U.S.C. section 360bbb-3(b)(1),  unless the authorization is terminated or revoked.  Performed at [facility], [place] [address], [place], [place] [address]      Time coordinating discharge: Over 30 minutes  SIGNED:   Nolberto Hanlon, MD  [facility] 02/15/2022, 9:01 AM Pager   If 7PM-7AM, please contact night-coverage [email or web address] Password [email or web address]

## 2022-02-16 ENCOUNTER — Telehealth: Payer: Self-pay

## 2022-02-16 NOTE — Telephone Encounter (Signed)
°  Transition Care Management Follow-up Telephone Call   Date of discharge and from where:[facility] on 02/15/2022 left AMA  How have you been since you were released from the hospital? Pt stated were the longest 24 hr in his life and felt like nothing was done just a bunch of testing. Feeling fine today. Offered an appt with PCP said is no needed that was just a pulled muscle Any questions or concerns? No questions/concerns reported.  Items Reviewed: Did the pt receive and understand the discharge instructions provided? Left AMA . Stated no instructions were given Medications obtained and verified? NO meds were given Was the pt encouraged to call back with questions or concerns?Instructed pt to call office with any  questions or concerns. Pt thankful for the call but Stated he has none at this time , and that he will call if he will need anything.

## 2022-02-17 LAB — HEPATITIS B SURFACE ANTIBODY, QUANTITATIVE: Hep B S AB Quant (Post): >1000 m[IU]/mL (ref >9.9)

## 2022-02-17 LAB — BODY FLUID CULTURE W GRAM STAIN: Culture: NO GROWTH

## 2022-02-18 ENCOUNTER — Other Ambulatory Visit: Payer: Self-pay

## 2022-02-19 ENCOUNTER — Other Ambulatory Visit: Payer: Self-pay

## 2022-02-19 LAB — CULTURE, BLOOD (SINGLE): Culture: NO GROWTH

## 2022-02-19 MED ORDER — CLONIDINE HCL 0.3 MG PO TABS
ORAL_TABLET | ORAL | 3 refills | Status: AC
  Filled 2022-02-19: qty 270, 90d supply, fill #0
  Filled 2022-05-08: qty 270, 90d supply, fill #1

## 2022-02-20 ENCOUNTER — Other Ambulatory Visit: Payer: Self-pay

## 2022-02-23 ENCOUNTER — Other Ambulatory Visit: Payer: Self-pay

## 2022-03-26 ENCOUNTER — Other Ambulatory Visit: Payer: Self-pay

## 2022-03-26 MED ORDER — SEVELAMER CARBONATE 800 MG PO TABS
ORAL_TABLET | ORAL | 11 refills | Status: DC
  Filled 2022-03-26 (×2): qty 540, 30d supply, fill #0

## 2022-03-27 ENCOUNTER — Other Ambulatory Visit: Payer: Self-pay

## 2022-04-03 ENCOUNTER — Other Ambulatory Visit: Payer: Self-pay

## 2022-04-10 ENCOUNTER — Ambulatory Visit (INDEPENDENT_AMBULATORY_CARE_PROVIDER_SITE_OTHER): Payer: Medicare Other

## 2022-04-16 ENCOUNTER — Other Ambulatory Visit: Payer: Self-pay

## 2022-04-16 MED ORDER — SEVELAMER CARBONATE 800 MG PO TABS
ORAL_TABLET | ORAL | 11 refills | Status: DC
  Filled 2022-04-16: qty 540, 30d supply, fill #0

## 2022-04-21 ENCOUNTER — Other Ambulatory Visit: Payer: Self-pay

## 2022-04-23 ENCOUNTER — Other Ambulatory Visit: Payer: Self-pay

## 2022-04-23 MED ORDER — SEVELAMER CARBONATE 800 MG PO TABS
ORAL_TABLET | ORAL | 11 refills | Status: AC
  Filled 2022-04-23: qty 1080, 90d supply, fill #0

## 2022-05-08 ENCOUNTER — Other Ambulatory Visit: Payer: Self-pay

## 2022-05-11 ENCOUNTER — Other Ambulatory Visit: Payer: Self-pay

## 2022-05-15 ENCOUNTER — Other Ambulatory Visit: Payer: Self-pay

## 2022-05-21 ENCOUNTER — Other Ambulatory Visit: Payer: Self-pay

## 2022-05-22 ENCOUNTER — Other Ambulatory Visit (HOSPITAL_COMMUNITY): Payer: Self-pay

## 2022-07-27 ENCOUNTER — Encounter (INDEPENDENT_AMBULATORY_CARE_PROVIDER_SITE_OTHER): Payer: Self-pay | Admitting: Primary Care

## 2022-07-27 ENCOUNTER — Ambulatory Visit (INDEPENDENT_AMBULATORY_CARE_PROVIDER_SITE_OTHER): Payer: Medicare Other | Admitting: Primary Care

## 2022-07-27 VITALS — BP 130/75 | HR 72 | Temp 98.1°F | Ht 77.0 in | Wt 212.2 lb

## 2022-07-27 DIAGNOSIS — N184 Chronic kidney disease, stage 4 (severe): Secondary | ICD-10-CM | POA: Diagnosis not present

## 2022-07-27 DIAGNOSIS — F411 Generalized anxiety disorder: Secondary | ICD-10-CM

## 2022-07-27 DIAGNOSIS — F32A Depression, unspecified: Secondary | ICD-10-CM | POA: Diagnosis not present

## 2022-07-27 DIAGNOSIS — Z760 Encounter for issue of repeat prescription: Secondary | ICD-10-CM | POA: Diagnosis not present

## 2022-07-27 MED ORDER — QUETIAPINE FUMARATE 50 MG PO TABS: ORAL_TABLET | Freq: Every day | ORAL | 1 refills | Status: DC
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
## 2022-07-28 ENCOUNTER — Emergency Department (HOSPITAL_BASED_OUTPATIENT_CLINIC_OR_DEPARTMENT_OTHER)
Admission: EM | Admit: 2022-07-28 | Discharge: 2022-07-28 | Disposition: A | Discharge status: Home / Self Care | Payer: Medicare Other | Attending: Emergency Medicine | Admitting: Emergency Medicine

## 2022-07-28 ENCOUNTER — Encounter (HOSPITAL_BASED_OUTPATIENT_CLINIC_OR_DEPARTMENT_OTHER): Payer: Self-pay | Admitting: Emergency Medicine

## 2022-07-28 ENCOUNTER — Other Ambulatory Visit: Payer: Self-pay

## 2022-07-28 DIAGNOSIS — K0889 Other specified disorders of teeth and supporting structures: Secondary | ICD-10-CM | POA: Diagnosis present

## 2022-07-28 DIAGNOSIS — N186 End stage renal disease: Secondary | ICD-10-CM | POA: Insufficient documentation

## 2022-07-28 DIAGNOSIS — Z992 Dependence on renal dialysis: Secondary | ICD-10-CM | POA: Insufficient documentation

## 2022-07-28 DIAGNOSIS — K029 Dental caries, unspecified: Secondary | ICD-10-CM | POA: Diagnosis not present

## 2022-07-28 MED ORDER — AMOXICILLIN-POT CLAVULANATE 250-125 MG PO TABS
1.0000 | ORAL_TABLET | Freq: Every day | ORAL | 0 refills | Status: AC
Start: 2022-07-28 — End: 2022-08-01

## 2022-07-28 MED ORDER — AMOXICILLIN-POT CLAVULANATE 875-125 MG PO TABS: 1.0000 | ORAL_TABLET | Freq: Two times a day (BID) | ORAL | 0 refills | Status: DC

## 2022-07-28 MED ORDER — IBUPROFEN 800 MG PO TABS
800.0000 mg | ORAL_TABLET | Freq: Once | ORAL | Status: AC
Start: 2022-07-28 — End: 2022-07-28
  Administered 2022-07-28: 800 mg via ORAL
  Filled 2022-07-28: qty 1

## 2022-07-28 MED ORDER — AMOXICILLIN-POT CLAVULANATE 875-125 MG PO TABS
1.0000 | ORAL_TABLET | Freq: Once | ORAL | Status: AC
  Administered 2022-07-28: 1 via ORAL
  Filled 2022-07-28: qty 1

## 2022-07-28 NOTE — ED Triage Notes (Signed)
Pt c/o left upper jaw tooth pain x 2 days; c/o left ear popping with pressure behind left eye, denies facial swelling

## 2022-07-28 NOTE — ED Provider Notes (Signed)
[facility]  Provider Note  CSN: [id number] Arrival date & time: 07/28/22 0220  History Chief Complaint  Patient presents with   Dental Pain    Douglas [name] is a 36 y.o. male with history of ESRD on HD TTS and poor dentition reports pain in L upper tooth for the last 2 days with popping in his L ear. No fever or drainage. Has had multiple prior dental extractions. Mother is at bedside and supplements history.    Home Medications Prior to Admission medications   Medication Sig Start Date End Date Taking? Authorizing Provider  amoxicillin-clavulanate (AUGMENTIN) 875-125 MG tablet Take 1 tablet by mouth every 12 (twelve) hours. 07/28/22  Yes Truddie Hidden, MD  amLODipine (NORVASC) 10 MG tablet Take 10 mg by mouth daily. 06/11/22   [provider]  Ascorbic Acid (VITAMIN C PO) Take 1 tablet by mouth daily. Patient not taking: Reported on 02/14/2022    [provider]  cholecalciferol (VITAMIN D3) 25 MCG (1000 UNIT) tablet Take 1,000 Units by mouth daily. On dialysis days    [provider]  cloNIDine (CATAPRES) 0.3 MG tablet Take 1 tablet by mouth three times a day as directed 02/19/22     hydrALAZINE (APRESOLINE) 100 MG tablet TAKE 1 TABLET BY MOUTH THREE TIMES DAILY Patient not taking: Reported on 02/14/2022 05/09/21 05/09/22  Kerin Perna, NP  ibuprofen (ADVIL) 200 MG tablet Take 200 mg by mouth every 6 (six) hours as needed for mild pain.    [provider]  losartan (COZAAR) 100 MG tablet Take 1 tablet by mouth every night for high blood pressure 10/16/21     losartan (COZAAR) 50 MG tablet TAKE 1 TABLET BY MOUTH EVERY NIGHT FOR HIGH BLOOD PRESSURE 03/06/21 03/06/22  Rexene Agent, MD  QUEtiapine (SEROQUEL) 50 MG tablet TAKE 1 TABLET (50 MG TOTAL) BY MOUTH AT BEDTIME. 07/27/22   Kerin Perna, NP  sevelamer carbonate (RENVELA) 800 MG tablet Take 4 tablet by mouth three times a day with meals and 3 tabs  BID prn with snacks 04/23/22        Allergies    Pork-derived products   Review of Systems   Review of Systems Please see HPI for pertinent positives and negatives  Physical Exam BP (!) 164/111 (BP Location: Left Arm)   Pulse 61   Temp 97.7 F (36.5 C) (Oral)   Resp 19   Ht 6\' 5"  (1.956 m)   Wt 99.8 kg   SpO2 99%   BMI 26.09 kg/m   Physical Exam Vitals and nursing note reviewed.  HENT:     Head: Normocephalic.     Left Ear: Tympanic membrane normal.     Nose: Nose normal.     Mouth/Throat:     Comments: Poor dentition, multiple dental caries, tenderness over the L upper canine. No fluctuance or signs of abscess Eyes:     Extraocular Movements: Extraocular movements intact.  Pulmonary:     Effort: Pulmonary effort is normal.  Musculoskeletal:        General: Normal range of motion.     Cervical back: Neck supple.  Skin:    Findings: No rash (on exposed skin).  Neurological:     Mental Status: He is alert and oriented to person, place, and time.  Psychiatric:        Mood and Affect: Mood normal.     ED Results / Procedures / Treatments   EKG None  Procedures Procedures  Medications Ordered in the ED Medications  amoxicillin-clavulanate (AUGMENTIN) 875-125 MG per tablet 1 tablet (has no administration in time range)  ibuprofen (ADVIL) tablet 800 mg (has no administration in time range)    Initial Impression and Plan  Patient here with tooth ache. He typically takes motrin for pain. Will also begin Abx. Referral to dentist. Due for dialysis later this morning .  ED Course       MDM Rules/Calculators/A&P Medical Decision Making Problems Addressed: Toothache: acute illness or injury  Risk Prescription drug management.    Final Clinical Impression(s) / ED Diagnoses Final diagnoses:  Toothache    Rx / DC Orders ED Discharge Orders          Ordered    amoxicillin-clavulanate (AUGMENTIN) 875-125 MG tablet  Every 12 hours        07/28/22  0342             Truddie Hidden, MD 07/28/22 [phone]

## 2022-12-22 ENCOUNTER — Emergency Department (HOSPITAL_BASED_OUTPATIENT_CLINIC_OR_DEPARTMENT_OTHER)
Admission: EM | Admit: 2022-12-22 | Discharge: 2022-12-22 | Disposition: A | Discharge status: Home / Self Care | Payer: Medicare Other | Attending: Emergency Medicine | Admitting: Emergency Medicine

## 2022-12-22 ENCOUNTER — Encounter (HOSPITAL_BASED_OUTPATIENT_CLINIC_OR_DEPARTMENT_OTHER): Payer: Self-pay | Admitting: Emergency Medicine

## 2022-12-22 ENCOUNTER — Other Ambulatory Visit (HOSPITAL_BASED_OUTPATIENT_CLINIC_OR_DEPARTMENT_OTHER): Payer: Self-pay

## 2022-12-22 DIAGNOSIS — D649 Anemia, unspecified: Secondary | ICD-10-CM | POA: Insufficient documentation

## 2022-12-22 DIAGNOSIS — H9221 Otorrhagia, right ear: Secondary | ICD-10-CM | POA: Insufficient documentation

## 2022-12-22 DIAGNOSIS — R7989 Other specified abnormal findings of blood chemistry: Secondary | ICD-10-CM | POA: Diagnosis not present

## 2022-12-22 DIAGNOSIS — H61892 Other specified disorders of left external ear: Secondary | ICD-10-CM | POA: Diagnosis not present

## 2022-12-22 DIAGNOSIS — I509 Heart failure, unspecified: Secondary | ICD-10-CM | POA: Diagnosis not present

## 2022-12-22 DIAGNOSIS — I2510 Atherosclerotic heart disease of native coronary artery without angina pectoris: Secondary | ICD-10-CM | POA: Insufficient documentation

## 2022-12-22 DIAGNOSIS — H9222 Otorrhagia, left ear: Secondary | ICD-10-CM

## 2022-12-22 DIAGNOSIS — I132 Hypertensive heart and chronic kidney disease with heart failure and with stage 5 chronic kidney disease, or end stage renal disease: Secondary | ICD-10-CM | POA: Insufficient documentation

## 2022-12-22 DIAGNOSIS — H9202 Otalgia, left ear: Secondary | ICD-10-CM | POA: Diagnosis present

## 2022-12-22 DIAGNOSIS — N186 End stage renal disease: Secondary | ICD-10-CM | POA: Diagnosis not present

## 2022-12-22 DIAGNOSIS — Z992 Dependence on renal dialysis: Secondary | ICD-10-CM

## 2022-12-22 DIAGNOSIS — Z79899 Other long term (current) drug therapy: Secondary | ICD-10-CM | POA: Insufficient documentation

## 2022-12-22 DIAGNOSIS — H938X2 Other specified disorders of left ear: Secondary | ICD-10-CM

## 2022-12-22 LAB — CBC
HCT: 24.9 % — ABNORMAL LOW (ref 39.0–52.0)
Hemoglobin: 8.1 g/dL — ABNORMAL LOW (ref 13.0–17.0)
MCH: 30.8 pg (ref 26.0–34.0)
MCHC: 32.5 g/dL (ref 30.0–36.0)
MCV: 94.7 fL (ref 80.0–100.0)
Platelets: 156 10*3/uL (ref 150–400)
RBC: 2.63 MIL/uL — ABNORMAL LOW (ref 4.22–5.81)
RDW: 16.2 % — ABNORMAL HIGH (ref 11.5–15.5)
WBC: 6.8 10*3/uL (ref 4.0–10.5)
nRBC: 0 % (ref 0.0–0.2)

## 2022-12-22 LAB — BASIC METABOLIC PANEL
Anion gap: 19 — ABNORMAL HIGH (ref 5–15)
BUN: 104 mg/dL — ABNORMAL HIGH (ref 6–20)
CO2: 28 mmol/L (ref 22–32)
Calcium: 8.2 mg/dL — ABNORMAL LOW (ref 8.9–10.3)
Chloride: 91 mmol/L — ABNORMAL LOW (ref 98–111)
Creatinine, Ser: 18.87 mg/dL — ABNORMAL HIGH (ref 0.61–1.24)
GFR, Estimated: 3 mL/min — ABNORMAL LOW (ref >60)
Glucose, Bld: 108 mg/dL — ABNORMAL HIGH (ref 70–99)
Potassium: 5.5 mmol/L — ABNORMAL HIGH (ref 3.5–5.1)
Sodium: 138 mmol/L (ref 135–145)

## 2022-12-22 MED ORDER — HYDROCODONE-ACETAMINOPHEN 5-325 MG PO TABS
1.0000 | ORAL_TABLET | Freq: Once | ORAL | Status: AC
  Administered 2022-12-22: 1 via ORAL
  Filled 2022-12-22: qty 1

## 2022-12-22 MED ORDER — SODIUM ZIRCONIUM CYCLOSILICATE 10 G PO PACK
10.0000 g | PACK | Freq: Every day | ORAL | Status: DC
  Administered 2022-12-22: 10 g via ORAL
  Filled 2022-12-22: qty 1

## 2022-12-22 MED ORDER — LOKELMA 10 G PO PACK
10.0000 g | PACK | Freq: Once | ORAL | 0 refills | Status: AC
Start: 2022-12-22 — End: 2022-12-22

## 2022-12-22 MED ORDER — OFLOXACIN 0.3 % OP SOLN
5.0000 [drp] | Freq: Two times a day (BID) | OPHTHALMIC | Status: DC
  Administered 2022-12-22: 5 [drp] via OTIC
  Filled 2022-12-22: qty 5

## 2022-12-22 NOTE — ED Triage Notes (Signed)
Pt arrived POV, caox4, ambulatory. Pt c/o L ear pain that started at approx 0230 this morning. Pt states he woke up and had blood coming from the ear. Denies any recent trauma. Last Tylenol approx  330-4am.

## 2022-12-22 NOTE — Discharge Instructions (Addendum)
Use the ear drops - 5 drops twice a day for 5 days.

## 2022-12-22 NOTE — ED Notes (Signed)
Left ear irrigated until clear. At first some bloody return.
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
## 2022-12-23 ENCOUNTER — Emergency Department (HOSPITAL_COMMUNITY)
Admission: EM | Admit: 2022-12-23 | Discharge: 2022-12-23 | Disposition: A | Discharge status: Home / Self Care | Payer: Medicare Other | Attending: Emergency Medicine | Admitting: Emergency Medicine

## 2022-12-23 ENCOUNTER — Other Ambulatory Visit: Payer: Self-pay

## 2022-12-23 ENCOUNTER — Emergency Department (HOSPITAL_COMMUNITY): Payer: Medicare Other

## 2022-12-23 ENCOUNTER — Encounter (HOSPITAL_COMMUNITY): Payer: Self-pay

## 2022-12-23 DIAGNOSIS — J45909 Unspecified asthma, uncomplicated: Secondary | ICD-10-CM | POA: Insufficient documentation

## 2022-12-23 DIAGNOSIS — Z992 Dependence on renal dialysis: Secondary | ICD-10-CM | POA: Diagnosis not present

## 2022-12-23 DIAGNOSIS — R569 Unspecified convulsions: Secondary | ICD-10-CM | POA: Diagnosis present

## 2022-12-23 DIAGNOSIS — J449 Chronic obstructive pulmonary disease, unspecified: Secondary | ICD-10-CM | POA: Diagnosis not present

## 2022-12-23 DIAGNOSIS — N186 End stage renal disease: Secondary | ICD-10-CM | POA: Diagnosis not present

## 2022-12-23 DIAGNOSIS — I120 Hypertensive chronic kidney disease with stage 5 chronic kidney disease or end stage renal disease: Secondary | ICD-10-CM | POA: Insufficient documentation

## 2022-12-23 DIAGNOSIS — Z79899 Other long term (current) drug therapy: Secondary | ICD-10-CM | POA: Insufficient documentation

## 2022-12-23 DIAGNOSIS — Z20822 Contact with and (suspected) exposure to covid-19: Secondary | ICD-10-CM | POA: Diagnosis not present

## 2022-12-23 LAB — I-STAT CHEM 8, ED
BUN: 68 mg/dL — ABNORMAL HIGH (ref 6–20)
Calcium, Ion: 0.89 mmol/L — CL (ref 1.15–1.40)
Chloride: 97 mmol/L — ABNORMAL LOW (ref 98–111)
Creatinine, Ser: 16.2 mg/dL — ABNORMAL HIGH (ref 0.61–1.24)
Glucose, Bld: 104 mg/dL — ABNORMAL HIGH (ref 70–99)
HCT: 32 % — ABNORMAL LOW (ref 39.0–52.0)
Hemoglobin: 10.9 g/dL — ABNORMAL LOW (ref 13.0–17.0)
Potassium: 3.9 mmol/L (ref 3.5–5.1)
Sodium: 137 mmol/L (ref 135–145)
TCO2: 27 mmol/L (ref 22–32)

## 2022-12-23 LAB — PROTIME-INR
INR: 1 (ref 0.8–1.2)
Prothrombin Time: 12.9 seconds (ref 11.4–15.2)

## 2022-12-23 LAB — COMPREHENSIVE METABOLIC PANEL
ALT: 16 U/L (ref 0–44)
AST: 34 U/L (ref 15–41)
Albumin: 3.9 g/dL (ref 3.5–5.0)
Alkaline Phosphatase: 106 U/L (ref 38–126)
Anion gap: 18 — ABNORMAL HIGH (ref 5–15)
BUN: 65 mg/dL — ABNORMAL HIGH (ref 6–20)
CO2: 25 mmol/L (ref 22–32)
Calcium: 8.2 mg/dL — ABNORMAL LOW (ref 8.9–10.3)
Chloride: 93 mmol/L — ABNORMAL LOW (ref 98–111)
Creatinine, Ser: 14.73 mg/dL — ABNORMAL HIGH (ref 0.61–1.24)
GFR, Estimated: 4 mL/min — ABNORMAL LOW (ref >60)
Glucose, Bld: 109 mg/dL — ABNORMAL HIGH (ref 70–99)
Potassium: 3.7 mmol/L (ref 3.5–5.1)
Sodium: 136 mmol/L (ref 135–145)
Total Bilirubin: 0.6 mg/dL (ref 0.3–1.2)
Total Protein: 7.5 g/dL (ref 6.5–8.1)

## 2022-12-23 LAB — DIFFERENTIAL
Abs Immature Granulocytes: 0.07 10*3/uL (ref 0.00–0.07)
Basophils Absolute: 0.1 10*3/uL (ref 0.0–0.1)
Basophils Relative: 0 %
Eosinophils Absolute: 0.1 10*3/uL (ref 0.0–0.5)
Eosinophils Relative: 0 %
Immature Granulocytes: 0 %
Lymphocytes Relative: 9 %
Lymphs Abs: 1.5 10*3/uL (ref 0.7–4.0)
Monocytes Absolute: 1.2 10*3/uL — ABNORMAL HIGH (ref 0.1–1.0)
Monocytes Relative: 8 %
Neutro Abs: 13.2 10*3/uL — ABNORMAL HIGH (ref 1.7–7.7)
Neutrophils Relative %: 83 %

## 2022-12-23 LAB — PHOSPHORUS: Phosphorus: 4.4 mg/dL (ref 2.5–4.6)

## 2022-12-23 LAB — CBC
HCT: 30 % — ABNORMAL LOW (ref 39.0–52.0)
Hemoglobin: 9.9 g/dL — ABNORMAL LOW (ref 13.0–17.0)
MCH: 31.3 pg (ref 26.0–34.0)
MCHC: 33 g/dL (ref 30.0–36.0)
MCV: 94.9 fL (ref 80.0–100.0)
Platelets: 205 10*3/uL (ref 150–400)
RBC: 3.16 MIL/uL — ABNORMAL LOW (ref 4.22–5.81)
RDW: 16.6 % — ABNORMAL HIGH (ref 11.5–15.5)
WBC: 16.1 10*3/uL — ABNORMAL HIGH (ref 4.0–10.5)
nRBC: 0 % (ref 0.0–0.2)

## 2022-12-23 LAB — MAGNESIUM: Magnesium: 2 mg/dL (ref 1.7–2.4)

## 2022-12-23 LAB — APTT: aPTT: 32 seconds (ref 24–36)

## 2022-12-23 LAB — RESP PANEL BY RT-PCR (RSV, FLU A&B, COVID)  RVPGX2
Influenza A by PCR: NEGATIVE
Influenza B by PCR: NEGATIVE
Resp Syncytial Virus by PCR: NEGATIVE
SARS Coronavirus 2 by RT PCR: NEGATIVE

## 2022-12-23 LAB — ETHANOL: Alcohol, Ethyl (B): <10 mg/dL (ref <10)

## 2022-12-23 MED ORDER — AMLODIPINE BESYLATE 5 MG PO TABS
10.0000 mg | ORAL_TABLET | Freq: Once | ORAL | Status: AC
  Administered 2022-12-23: 10 mg via ORAL
  Filled 2022-12-23: qty 2

## 2022-12-23 MED ORDER — FENTANYL CITRATE PF 50 MCG/ML IJ SOSY: 25.0000 ug | PREFILLED_SYRINGE | Freq: Once | INTRAMUSCULAR | Status: DC

## 2022-12-23 MED ORDER — ONDANSETRON 4 MG PO TBDP: 4.0000 mg | ORAL_TABLET | Freq: Three times a day (TID) | ORAL | 0 refills | Status: AC | PRN

## 2022-12-23 MED ORDER — HYDRALAZINE HCL 10 MG PO TABS
10.0000 mg | ORAL_TABLET | Freq: Once | ORAL | Status: AC
  Administered 2022-12-23: 10 mg via ORAL
  Filled 2022-12-23: qty 1

## 2022-12-23 MED ORDER — SODIUM CHLORIDE 0.9 % IV SOLN: 12.5000 mg | Freq: Four times a day (QID) | INTRAVENOUS | Status: DC | PRN

## 2022-12-23 MED ORDER — ACETAMINOPHEN 325 MG PO TABS
650.0000 mg | ORAL_TABLET | Freq: Once | ORAL | Status: AC
  Administered 2022-12-23: 650 mg via ORAL
  Filled 2022-12-23: qty 2

## 2022-12-23 MED ORDER — OXYCODONE-ACETAMINOPHEN 5-325 MG PO TABS
1.0000 | ORAL_TABLET | Freq: Once | ORAL | Status: AC
  Administered 2022-12-23: 1 via ORAL
  Filled 2022-12-23: qty 1

## 2022-12-23 MED ORDER — KETOROLAC TROMETHAMINE 15 MG/ML IJ SOLN: 15.0000 mg | Freq: Once | INTRAMUSCULAR | Status: DC

## 2022-12-23 MED ORDER — ONDANSETRON HCL 4 MG/2ML IJ SOLN
4.0000 mg | Freq: Once | INTRAMUSCULAR | Status: AC
  Administered 2022-12-23: 4 mg via INTRAVENOUS
  Filled 2022-12-23: qty 2

## 2022-12-23 NOTE — Discharge Instructions (Signed)
Davaun's head CT looks normal today.  All of his lab work is at his baseline as well.  I spoke with our neurology team who suggest that you make an appointment with them for outpatient discussion surrounding his seizure.  Call the office attached to these discharge papers to request an appointment.  Be sure to resume your normal dialysis schedule.  Return to the emergency department immediately with any recurring symptoms.  I have also attached information about seizures to these discharge papers.  Your nausea medication is at the pharmacy.

## 2022-12-23 NOTE — Progress Notes (Signed)
Removed artery & vein needles. Hold it for 10-15 min and stopped bleeding. Applied pressure dressing both. No complications while removed needles. HS [employer]

## 2022-12-23 NOTE — ED Provider Notes (Signed)
[facility] Provider Note   CSN: [id number] Arrival date & time: 12/23/22  1324     History  No chief complaint on file.   Douglas [name] is a 36 y.o. male.  36 year old male with history of ESRD on dialysis (Tuesday/Thursday/Saturday), COPD, asthma, hypertension, TBI brought in by EMS from dialysis center after a seizure at dialysis today.  Mom at bedside assist with history, states that patient woke up today at 8:00 AM, around 830 started complaining of weakness in his right arm and leg and his speech was off.  Patient was in the ER yesterday for bleeding from the ear found to have ear infection and treated with drops, was told that his phosphorus was elevated yesterday and given medication for this.  Mom called dialysis center who noted patient's symptoms were likely due to his high phosphorus and was advised to go ahead and come in for dialysis.  Patient was about an hour away from completing his treatment when he had what was reported by the dialysis center to be a grand mall seizure.  No history of prior seizures.  Mom notes patient was confused on arrival in the ER today when she got to him, somewhat improving at this time.  Patient does still make urine, denies loss of bladder control, did not bite tongue       Home Medications Prior to Admission medications   Medication Sig Start Date End Date Taking? Authorizing Provider  amLODipine (NORVASC) 10 MG tablet Take 10 mg by mouth daily. 06/11/22   [provider]  Ascorbic Acid (VITAMIN C PO) Take 1 tablet by mouth daily. Patient not taking: Reported on 02/14/2022    [provider]  cholecalciferol (VITAMIN D3) 25 MCG (1000 UNIT) tablet Take 1,000 Units by mouth daily. On dialysis days    [provider]  cloNIDine (CATAPRES) 0.3 MG tablet Take 1 tablet by mouth three times a day as directed 02/19/22     hydrALAZINE (APRESOLINE) 100 MG tablet TAKE 1 TABLET BY  MOUTH THREE TIMES DAILY Patient not taking: Reported on 02/14/2022 05/09/21 05/09/22  Kerin Perna, NP  ibuprofen (ADVIL) 200 MG tablet Take 200 mg by mouth every 6 (six) hours as needed for mild pain.    [provider]  losartan (COZAAR) 100 MG tablet Take 1 tablet by mouth every night for high blood pressure 10/16/21     losartan (COZAAR) 50 MG tablet TAKE 1 TABLET BY MOUTH EVERY NIGHT FOR HIGH BLOOD PRESSURE 03/06/21 03/06/22  Rexene Agent, MD  QUEtiapine (SEROQUEL) 50 MG tablet TAKE 1 TABLET (50 MG TOTAL) BY MOUTH AT BEDTIME. 07/27/22   Kerin Perna, NP  sevelamer carbonate (RENVELA) 800 MG tablet Take 4 tablet by mouth three times a day with meals and 3 tabs BID prn with snacks 04/23/22         Allergies    Pork-derived products    Review of Systems   Review of Systems Negative except as per HPI Physical Exam Updated Vital Signs BP (!) 168/130 (BP Location: Left Arm)   Pulse 91   Temp 98.6 F (37 C) (Oral)   Resp 18   SpO2 100%  Physical Exam Vitals and nursing note reviewed.  Constitutional:      General: He is not in acute distress.    Appearance: He is well-developed. He is not diaphoretic.  HENT:     Head: Normocephalic and atraumatic.     Mouth/Throat:  Mouth: Mucous membranes are moist.  Eyes:     Extraocular Movements: Extraocular movements intact.     Pupils: Pupils are equal, round, and reactive to light.  Cardiovascular:     Rate and Rhythm: Normal rate and regular rhythm.     Heart sounds: Normal heart sounds.  Pulmonary:     Effort: Pulmonary effort is normal.     Breath sounds: Normal breath sounds.  Abdominal:     Palpations: Abdomen is soft.     Tenderness: There is no abdominal tenderness.  Musculoskeletal:     Cervical back: Neck supple.     Right lower leg: No edema.     Left lower leg: No edema.  Skin:    General: Skin is warm and dry.     Findings: No erythema or rash.  Neurological:     Mental Status: He is alert and  oriented to person, place, and time.     Cranial Nerves: No cranial nerve deficit.     Sensory: No sensory deficit.     Comments: Globally weak  Psychiatric:        Behavior: Behavior normal.     ED Results / Procedures / Treatments   Labs (all labs ordered are listed, but only abnormal results are displayed) Labs Reviewed  CBC - Abnormal; Notable for the following components:      Result Value   WBC 16.1 (*)    RBC 3.16 (*)    Hemoglobin 9.9 (*)    HCT 30.0 (*)    RDW 16.6 (*)    All other components within normal limits  DIFFERENTIAL - Abnormal; Notable for the following components:   Neutro Abs 13.2 (*)    Monocytes Absolute 1.2 (*)    All other components within normal limits  COMPREHENSIVE METABOLIC PANEL - Abnormal; Notable for the following components:   Chloride 93 (*)    Glucose, Bld 109 (*)    BUN 65 (*)    Creatinine, Ser 14.73 (*)    Calcium 8.2 (*)    GFR, Estimated 4 (*)    Anion gap 18 (*)    All other components within normal limits  I-STAT CHEM 8, ED - Abnormal; Notable for the following components:   Chloride 97 (*)    BUN 68 (*)    Creatinine, Ser 16.20 (*)    Glucose, Bld 104 (*)    Calcium, Ion 0.89 (*)    Hemoglobin 10.9 (*)    HCT 32.0 (*)    All other components within normal limits  RESP PANEL BY RT-PCR (RSV, FLU A&B, COVID)  RVPGX2  ETHANOL  PROTIME-INR  APTT  RAPID URINE DRUG SCREEN, HOSP PERFORMED  URINALYSIS, ROUTINE W REFLEX MICROSCOPIC  CALCIUM, IONIZED    EKG None  Radiology No results found.  Procedures Procedures    Medications Ordered in ED Medications  ondansetron (ZOFRAN) injection 4 mg (has no administration in time range)  acetaminophen (TYLENOL) tablet 650 mg (has no administration in time range)    ED Course/ Medical Decision Making/ A&P                           Medical Decision Making  36 year old male brought in by EMS from dialysis center after a seizure during treatment today.  No history of prior  seizures.  History provided as above per mom home lives with patient. Patient states he had a stroke this morning, sounds like this may have  been around 8:30 AM with last known well was 8 AM today.  Mom notes right-sided weakness, difficulty forming sentences.  Patient states he had weakness in 1 arm that spread to the other arm.  Patient ultimately was taken to dialysis and was an hour away from completing his session when he had what was described as a full body seizure.  Patient arrived in the ER and per mom was confused, improving at this time.  He appears to feel unwell and is generally weak, no sensory deficits.  Plan is to assess labs, obtain CT of the head.  Care is signed out pending these results as well as labs, EKG, consult to neurology would likely plan for admission to the hospital.  Discussed with ER attending Dr. Truett Mainland who agrees with plan of care.  Care signed out at shift change pending work up and results.         Final Clinical Impression(s) / ED Diagnoses Final diagnoses:  None    Rx / DC Orders ED Discharge Orders     None         Tacy Learn, PA-C 12/23/22 1525    Cristie Hem, MD 12/24/22 [phone]

## 2022-12-23 NOTE — ED Provider Notes (Signed)
I received this patient handoff from previous PA Suella Broad.  Please see her chart for original history and workup thus far.  In short patient is a 36 year old male currently on dialysis TTS presenting today after a questionable stroke or seizure.  Around 830 this morning he had acute onset right-sided weakness and mother reports that he was "talking weird".  They thought it was because he missed dialysis yesterday but while he was at dialysis today the staff believes he had a "grand mall seizure".  Currently he seems globally weak but may be more alert.  Question postictal state.  At this time all of patient's labs, EKG and head CT ordered.  Plan is for me to follow-up on these and dispo accordingly.   Physical Exam  BP (!) 168/130 (BP Location: Left Arm)   Pulse 91   Temp 98.6 F (37 C) (Oral)   Resp 18   SpO2 100%   Physical Exam Vitals and nursing note reviewed.  Constitutional:      Appearance: Normal appearance.  HENT:     Head: Normocephalic and atraumatic.  Eyes:     General: No scleral icterus.    Conjunctiva/sclera: Conjunctivae normal.  Pulmonary:     Effort: Pulmonary effort is normal. No respiratory distress.  Skin:    Findings: No rash.  Neurological:     Mental Status: He is alert.  Psychiatric:        Mood and Affect: Mood normal.     Procedures  Procedures  ED Course / MDM    Medical Decision Making Amount and/or Complexity of Data Reviewed Labs: ordered. Radiology: ordered.  Risk OTC drugs. Prescription drug management.   Labs: WBC count 16.1, 6.8 yesterday.  Question inflammation or secondary to questionable seizure at dialysis Creatinine 14.73, 30 minutes better than baseline, likely secondary to dialysis today Calcium level is a send out   CT head: No findings.  Consult: I spoke with Dr. Suzan Slick with neurology.  He reviewed the patient's CT scan and history.  Suggest outpatient follow-up due to patient's first-time  seizure.  MDM/disposition: Spoke with the patient and his mother about outpatient follow-up.  They are agreeable to discharge and outpatient EEG.  They are requesting something for nausea and headache.  Medications ordered.  Calcium on i-STAT Chem-8 was noted to be 0.89.  Previous team sent off an ionized calcium however this is a send out lab that we will not get today.  BMP with a calcium of 8.2, will not treat as cause of Sz.  Patient is stable to be discharged home after neurology consult at this time.  No indication for admission.   I will discharge home with Zofran as well.  He will continue his normal dialysis schedule.  All questions were answered and patient was discharged.        Rhae Hammock, PA-C 12/23/22 1809    Audley Hose, MD 12/24/22 [phone]

## 2022-12-23 NOTE — ED Notes (Signed)
Patient arrived to ED with Dialysis fistula accessed. Patient postictal and attempting to pull line from site. IV team order placed for de access.

## 2022-12-23 NOTE — ED Triage Notes (Signed)
Patient arrived from dialysis with witnessed seizure. Patient received 2.5 hours and then described as general tonic/clonic seizure. Patient arrived with IV catheter accessed and post-ictal. Patient CBG 135.

## 2023-01-08 ENCOUNTER — Other Ambulatory Visit: Payer: Self-pay

## 2023-01-27 ENCOUNTER — Encounter (INDEPENDENT_AMBULATORY_CARE_PROVIDER_SITE_OTHER): Payer: Self-pay | Admitting: Primary Care

## 2023-01-27 ENCOUNTER — Ambulatory Visit (INDEPENDENT_AMBULATORY_CARE_PROVIDER_SITE_OTHER): Payer: Medicare Other | Admitting: Primary Care

## 2023-01-27 VITALS — BP 124/80 | HR 62 | Resp 16 | Ht 77.0 in | Wt 221.4 lb

## 2023-01-27 DIAGNOSIS — I10 Essential (primary) hypertension: Secondary | ICD-10-CM | POA: Diagnosis not present

## 2023-01-27 DIAGNOSIS — F411 Generalized anxiety disorder: Secondary | ICD-10-CM

## 2023-01-27 DIAGNOSIS — E663 Overweight: Secondary | ICD-10-CM

## 2023-01-27 DIAGNOSIS — F32A Depression, unspecified: Secondary | ICD-10-CM | POA: Diagnosis not present

## 2023-01-27 NOTE — Patient Instructions (Signed)
Bariatric Surgery Information Bariatric surgery, also called weight-loss surgery, is a procedure that helps you lose weight. You may have bariatric surgery if: You have been unable to lose weight through diet and exercise. You have health problems caused by obesity, such as: Type 2 diabetes. Heart disease. Lung disease. How does bariatric surgery help me lose weight? Bariatric surgery helps you lose weight by: Decreasing how much food your body absorbs. This is done by closing off part of your stomach to make it smaller. This limits the amount of food your stomach can hold. Changing your body's regular digestive process so that food bypasses the parts of your body that absorb calories and nutrients. If you decide to have bariatric surgery, it is important to continue to eat a healthy diet and to exercise regularly after the surgery. What are the risks of bariatric surgery? As with any surgical procedure, each type of bariatric surgery has its own risks. These risks also depend on your age, your overall health, and any other medical conditions you may have. Risks of bariatric surgery can be divided into two groups. There are short-term risks and long-term risks. Short-term risks include: Infection. Bleeding. Allergic reactions to medicines or dyes. A blood clot that forms in the leg and travels to the heart or lungs. Leaking of digestive juices into the abdomen. Long-term risks and complications include: Not getting enough nutrients for your body (malnutrition). Narrowing of the digestive tract (stricture or stenosis). Diarrhea, nausea, or vomiting after eating (dumping syndrome). Failure of the device or procedure. This may require another surgery to correct the problem. When deciding on bariatric surgery, it is very important that you: Talk to your health care provider and choose the surgery that is best for you. Ask your health care provider about specific risks for the surgery you  choose. What are the different kinds of bariatric surgery? There are two kinds of bariatric surgeries: Restrictive surgery. This procedure makes your stomach smaller. It does not change your digestive process. The smaller the size of your new stomach, the less food you can eat. There are different types of restrictive surgeries. Malabsorptive surgery. This procedure makes your stomach smaller and alters your digestive process so that your body processes fewer calories and nutrients. These are the most common kind of bariatric surgery. There are different types of malabsorptive surgeries. What are the different types of restrictive surgery? Adjustable gastric banding In this procedure, an inflatable band is placed around your stomach near the upper end. This makes the passageway for food into the rest of your stomach much smaller. The band can be adjusted, making it tighter or looser, by filling it with salt solution. Your surgeon can adjust the band based on how you are feeling and how much weight you are losing. The band can be removed in the future. This requires another surgery. Sleeve gastrectomy In this procedure, your stomach is made smaller. This is done by surgically removing a large part of your stomach. When your stomach is smaller, you feel full more quickly and reduce how much you eat. What are the different types of malabsorptive surgery? Roux-en-Y gastric bypass (RGB) This is the most common weight-loss surgery. In this procedure, a small stomach pouch (gastric pouch) is created in the upper part of your stomach. Next, this gastric pouch is attached directly to the middle part of your small intestine. The farther down your small intestine the new connection is made, the fewer calories and nutrients you will absorb. This surgery  has the highest rate of complications. Biliopancreatic diversion with duodenal switch (BPD/DS) This is a multi-step procedure. First, a large part of your stomach  is removed, making your stomach smaller. Next, this smaller stomach is attached to the lower part of your small intestine. Like the RGB surgery, you absorb fewer calories and nutrients if your stomach is attached farther down the small intestine. Where to find more information American Society for Metabolic and Bariatric Surgery: www.asmbs.[employer] and Digestive and Kidney Diseases: [employer] Summary Bariatric surgery, also called weight-loss surgery, is a procedure that helps you lose weight. This surgery may be recommended if you have been unable to lose weight through diet and exercise, or you have health problems caused by obesity, such as type 2 diabetes, heart disease, or lung disease. Generally, risks of bariatric surgery include infection, bleeding, and failure of the surgery or device. Failure of the surgery or device may require another surgery to correct the problem. This information is not intended to replace advice given to you by your health care provider. Make sure you discuss any questions you have with your health care provider. Document Revised: 03/12/2021 Document Reviewed: 03/12/2021 Elsevier Patient Education  [employer].
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
## 2023-02-02 ENCOUNTER — Encounter (INDEPENDENT_AMBULATORY_CARE_PROVIDER_SITE_OTHER): Payer: Medicare Other | Admitting: Primary Care

## 2023-03-16 ENCOUNTER — Other Ambulatory Visit (HOSPITAL_COMMUNITY): Payer: Self-pay

## 2023-06-07 ENCOUNTER — Ambulatory Visit (INDEPENDENT_AMBULATORY_CARE_PROVIDER_SITE_OTHER): Payer: Medicare Other

## 2023-09-06 ENCOUNTER — Ambulatory Visit (INDEPENDENT_AMBULATORY_CARE_PROVIDER_SITE_OTHER): Payer: Medicare Other

## 2023-09-06 ENCOUNTER — Encounter (INDEPENDENT_AMBULATORY_CARE_PROVIDER_SITE_OTHER): Payer: Self-pay

## 2023-09-06 VITALS — BP 150/88 | Ht 77.0 in | Wt 230.0 lb

## 2023-09-06 DIAGNOSIS — Z0000 Encounter for general adult medical examination without abnormal findings: Secondary | ICD-10-CM

## 2023-09-06 DIAGNOSIS — Z0100 Encounter for examination of eyes and vision without abnormal findings: Secondary | ICD-10-CM

## 2023-09-06 LAB — HEPATITIS C ANTIBODY: Hepatitis C Ab: NEGATIVE

## 2023-09-06 NOTE — Patient Instructions (Addendum)
Douglas [name] , Thank you for taking time to come for your Medicare Wellness Visit. I appreciate your ongoing commitment to your health goals. Please review the following plan we discussed and let me know if I can assist you in the future.   Referrals/Orders/Follow-Ups/Clinician Recommendations:  You have been referred to [facility] for a complete eye exam. If you haven't heard from them in a few days, please call them to schedule your appointment.  [facility] [address] [place] [address] Phone: [phone]   You are due for the vaccines checked below. You may have these done at your preferred pharmacy. Please have them fax the office proof of the vaccines so that we can update your chart.   [x]  Flu (due annually) []  Shingrix (Shingles vaccine) []  Pneumonia Vaccines []  TDAP (Tetanus) Vaccine every 10 years [x]  Covid-19   This is a list of the screening recommended for you and due dates:  Health Maintenance  Topic Date Due   Medicare Annual Wellness Visit  Never done   DTaP/Tdap/Td vaccine (1 - Tdap) Never done   Flu Shot  07/29/2023   COVID-19 Vaccine (1 - 2023-24 season) Never done   Hepatitis C Screening  Completed   HIV Screening  Completed   HPV Vaccine  Aged Out    Advanced directives: (ACP Link)Information on Advanced Care Planning can be found at [facility] Advance Health Care Directives Advance Health Care Directives ([email or web address]   Next Medicare Annual Wellness Visit scheduled for next year: Yes  Preventive Care 10-2 Years Old, Male Preventive care refers to lifestyle choices and visits with your health care provider that can promote health and wellness. Preventive care visits are also called wellness exams. What can I expect for my preventive care visit? Counseling During your preventive care visit, your health care provider may ask about your: Medical history, including: Past medical problems. Family medical  history. Current health, including: Emotional well-being. Home life and relationship well-being. Sexual activity. Lifestyle, including: Alcohol, nicotine or tobacco, and drug use. Access to firearms. Diet, exercise, and sleep habits. Safety issues such as seatbelt and bike helmet use. Sunscreen use. Work and work Astronomer. Physical exam Your health care provider may check your: Height and weight. These may be used to calculate your BMI (body mass index). BMI is a measurement that tells if you are at a healthy weight. Waist circumference. This measures the distance around your waistline. This measurement also tells if you are at a healthy weight and may help predict your risk of certain diseases, such as type 2 diabetes and high blood pressure. Heart rate and blood pressure. Body temperature. Skin for abnormal spots. What immunizations do I need?  Vaccines are usually given at various ages, according to a schedule. Your health care provider will recommend vaccines for you based on your age, medical history, and lifestyle or other factors, such as travel or where you work. What tests do I need? Screening Your health care provider may recommend screening tests for certain conditions. This may include: Lipid and cholesterol levels. Diabetes screening. This is done by checking your blood sugar (glucose) after you have not eaten for a while (fasting). Hepatitis B test. Hepatitis C test. HIV (human immunodeficiency virus) test. STI (sexually transmitted infection) testing, if you are at risk. Talk with your health care provider about your test results, treatment options, and if necessary, the need for more tests. Follow these instructions at home: Eating and  drinking  Eat a healthy diet that includes fresh fruits and vegetables, whole grains, lean protein, and low-fat dairy products. Drink enough fluid to keep your urine pale yellow. Take vitamin and mineral supplements as  recommended by your health care provider. Do not drink alcohol if your health care provider tells you not to drink. If you drink alcohol: Limit how much you have to 0-2 drinks a day. Know how much alcohol is in your drink. In the [place] one drink equals one 12 oz bottle of beer (355 mL), one 5 oz glass of wine (148 mL), or one 1 oz glass of hard liquor (44 mL). Lifestyle Brush your teeth every morning and night with fluoride toothpaste. Floss one time each day. Exercise for at least 30 minutes 5 or more days each week. Do not use any products that contain nicotine or tobacco. These products include cigarettes, chewing tobacco, and vaping devices, such as e-cigarettes. If you need help quitting, ask your health care provider. Do not use drugs. If you are sexually active, practice safe sex. Use a condom or other form of protection to prevent STIs. Find healthy ways to manage stress, such as: Meditation, yoga, or listening to music. Journaling. Talking to a trusted person. Spending time with friends and family. Minimize exposure to UV radiation to reduce your risk of skin cancer. Safety Always wear your seat belt while driving or riding in a vehicle. Do not drive: If you have been drinking alcohol. Do not ride with someone who has been drinking. If you have been using any mind-altering substances or drugs. While texting. When you are tired or distracted. Wear a helmet and other protective equipment during sports activities. If you have firearms in your house, make sure you follow all gun safety procedures. Seek help if you have been physically or sexually abused. What's next? Go to your health care provider once a year for an annual wellness visit. Ask your health care provider how often you should have your eyes and teeth checked. Stay up to date on all vaccines. This information is not intended to replace advice given to you by your health care provider. Make sure you discuss any  questions you have with your health care provider. Document Revised: 06/11/2021 Document Reviewed: 06/11/2021 Elsevier Patient Education  2024 [employer].
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
## 2023-10-06 NOTE — Progress Notes (Signed)
I have reviewed the documentation in this encounter.  I agree with recommendations documented.

## 2024-03-21 ENCOUNTER — Other Ambulatory Visit: Payer: Self-pay

## 2024-03-21 ENCOUNTER — Emergency Department (HOSPITAL_COMMUNITY)
Admission: EM | Admit: 2024-03-21 | Discharge: 2024-03-21 | Disposition: A | Discharge status: Home / Self Care | Attending: Emergency Medicine | Admitting: Emergency Medicine

## 2024-03-21 DIAGNOSIS — Z992 Dependence on renal dialysis: Secondary | ICD-10-CM | POA: Insufficient documentation

## 2024-03-21 DIAGNOSIS — H60503 Unspecified acute noninfective otitis externa, bilateral: Secondary | ICD-10-CM

## 2024-03-21 DIAGNOSIS — H60533 Acute contact otitis externa, bilateral: Secondary | ICD-10-CM | POA: Insufficient documentation

## 2024-03-21 DIAGNOSIS — J449 Chronic obstructive pulmonary disease, unspecified: Secondary | ICD-10-CM | POA: Diagnosis not present

## 2024-03-21 DIAGNOSIS — J45909 Unspecified asthma, uncomplicated: Secondary | ICD-10-CM | POA: Diagnosis not present

## 2024-03-21 DIAGNOSIS — N186 End stage renal disease: Secondary | ICD-10-CM | POA: Diagnosis not present

## 2024-03-21 DIAGNOSIS — H9203 Otalgia, bilateral: Secondary | ICD-10-CM | POA: Diagnosis present

## 2024-03-21 MED ORDER — OFLOXACIN 0.3 % OP SOLN
5.0000 [drp] | Freq: Once | OPHTHALMIC | Status: AC
  Administered 2024-03-21: 5 [drp] via OTIC
  Filled 2024-03-21: qty 5

## 2024-03-21 MED ORDER — KETOROLAC TROMETHAMINE 15 MG/ML IJ SOLN
15.0000 mg | Freq: Once | INTRAMUSCULAR | Status: AC
  Administered 2024-03-21: 15 mg via INTRAMUSCULAR
  Filled 2024-03-21: qty 1

## 2024-03-21 NOTE — ED Notes (Signed)
Alert, NAD, calm, interactive, steady gait.  

## 2024-03-21 NOTE — ED Triage Notes (Addendum)
 Pt. Stated, My left ear started bleeding last night. Looks like a scab has been scratched off.  Its also in pain. Due for dialysis today and said he can do it here. Last treatment was Saturday. Had full treatment.

## 2024-03-21 NOTE — ED Notes (Signed)
 Got patient on the monitor did EKG shown to Dr Freida Busman patient is resting with family at bedside and call bell in reach

## 2024-03-21 NOTE — ED Notes (Signed)
ED PA at BS 

## 2024-03-21 NOTE — ED Notes (Signed)
 Pt/mother verbalize, ready to go after ear drops, they are working me in for HD at my HD center this am. Pending ear drops from pharmacy.

## 2024-03-21 NOTE — Discharge Instructions (Addendum)
 It was a pleasure taking part in your care.  As discussed, please begin using eardrops in both ears once a day.  Place 5 drops in both of your ears once a day, leave ear wick in.  Please do this for 5 days.  Please follow-up with your ENT specialist in the next 5 days.  Please also follow-up with dialysis today for treatment.

## 2024-03-21 NOTE — ED Provider Notes (Signed)
 [place] EMERGENCY DEPARTMENT AT [facility] Provider Note   CSN: [id number] Arrival date & time: 03/21/24  6144     History  Chief Complaint  Patient presents with   ear bleeding    Douglas [name] is a 38 y.o. 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  The patient presents to the ED for evaluation of bilateral ear drainage and pain.  Reports that symptoms began yesterday.  States he has an ENT doctor, unable to tell me the name of her.  States he gets frequent ear infections typically around this time of the year.  Denies fevers, nausea or vomiting at home.  Is endorsing bleeding, drainage from his left ear.  Denies loss of hearing.  Denies trauma to ear.  Denies placing things into his ear such as Q-tips, earbuds.  Patient was supposed to go to dialysis this morning but due to his concern, presented to ED.  HPI     Home Medications Prior to Admission medications   Medication Sig Start Date End Date Taking? Authorizing Provider  Ascorbic Acid (VITAMIN C PO) Take 1 tablet by mouth daily. Patient not taking: Reported on 02/14/2022    [provider]  cholecalciferol (VITAMIN D3) 25 MCG (1000 UNIT) tablet Take 1,000 Units by mouth daily. On dialysis days    [provider]  cloNIDine (CATAPRES) 0.3 MG tablet Take 1 tablet by mouth three times a day as directed 02/19/22     ibuprofen (ADVIL) 200 MG tablet Take 200 mg by mouth every 6 (six) hours as needed for mild pain.    [provider]  losartan (COZAAR) 100 MG tablet Take 1 tablet by mouth every night for high blood pressure 10/16/21     losartan (COZAAR) 50 MG tablet TAKE 1 TABLET BY MOUTH EVERY NIGHT FOR HIGH BLOOD PRESSURE 03/06/21 03/06/22  Arita Miss, MD  nebivolol (BYSTOLIC) 5 MG tablet Take 5 mg by mouth daily.    [provider]   ondansetron (ZOFRAN-ODT) 4 MG disintegrating tablet Take 1 tablet (4 mg total) by mouth every 8 (eight) hours as needed for nausea or vomiting. Patient not taking: Reported on 09/06/2023 12/23/22   Redwine, Madison A, PA-C  sevelamer carbonate (RENVELA) 800 MG tablet Take 4 tablet by mouth three times a day with meals and 3 tabs BID prn with snacks 04/23/22         Allergies    Pork-derived products    Review of Systems   Review of Systems  Physical Exam Updated Vital Signs BP (!) 180/116 (BP Location: Left Arm)   Pulse 69   Temp 97.7 F (36.5 C) (Oral)   Resp 17   Ht 6\' 5"  (1.956 m)   Wt 104.3 kg   BMI 27.27 kg/m  Physical Exam  ED Results / Procedures / Treatments   Labs (all labs ordered are listed, but only abnormal results are displayed) Labs Reviewed - No data to display  EKG None  Radiology No results found.  Procedures Procedures   Medications Ordered in ED Medications  ofloxacin (OCUFLOX) 0.3 % ophthalmic solution 5 drop (has no administration in time range)  ketorolac (TORADOL) 15 MG/ML injection 15 mg (15 mg Intramuscular Given 03/21/24 0813)    ED Course/ Medical Decision Making/ A&P  Medical Decision Making Risk Prescription drug management.   38 year old presents for evaluation.  Please see HPI for  further details.  On exam, patient left EAC has bleeding, erythema noted.  TM visualized, no appearance of infection beyond EAC in left ear.  Patient right EAC has purulent drainage at the [facility] along with swelling.  TM visualized.  Malignant otitis externa was considered for this patient however patient has no crusting, erythema, swelling to his pinna.  He does have some noticeable purulent discharge to his right EAC.  Patient also has bleeding present to his left EAC.  Patient right EAC does appear slightly swollen.  Ear wick was placed.  Patient was placed on ofloxacin drops which she will place in his bilateral ears 5 drops once a day for 5  days.  Patient states that he missed his dialysis appointment to come to ED this morning.  He did reach out to his dialysis center and they are able to schedule him in for later this afternoon.  Due to this, we will not assess labs.  Patient will be placed on ofloxacin drops.  He also had ear wick placed.  I have encouraged him/advised him that he will need to follow-up with his ENT doctor in the next 5 days.  He voices understanding.  He had all of his questions answered his satisfaction.  He is stable to discharge him at this time.    Final Clinical Impression(s) / ED Diagnoses Final diagnoses:  Acute otitis externa of both ears, unspecified type    Rx / DC Orders ED Discharge Orders     None         Clent Ridges 03/21/24 0836    Lorre Nick, MD 03/22/24 1016

## 2024-03-22 ENCOUNTER — Telehealth (INDEPENDENT_AMBULATORY_CARE_PROVIDER_SITE_OTHER): Payer: Self-pay

## 2024-03-22 NOTE — Transitions of Care (Post Inpatient/ED Visit) (Signed)
   03/22/2024  Name: Douglas [name] MRN: [id number] DOB: Jun 16, 1986  Today's TOC FU Call Status: Today's TOC FU Call Status:: Unsuccessful Call (1st Attempt) Unsuccessful Call (1st Attempt) Date: 03/22/24  Attempted to reach the patient regarding the most recent Inpatient/ED visit.  Follow Up Plan: Additional outreach attempts will be made to reach the patient to complete the Transitions of Care (Post Inpatient/ED visit) call.   Signature  Kandis Fantasia, LPN [facility] Health Advisor [place] [facility] You Are. We Are. [facility] Direct Dial [phone]

## 2024-04-17 ENCOUNTER — Emergency Department (HOSPITAL_COMMUNITY)
Admission: EM | Admit: 2024-04-17 | Discharge: 2024-04-18 | Disposition: A | Discharge status: Home / Self Care | Attending: Emergency Medicine | Admitting: Emergency Medicine

## 2024-04-17 ENCOUNTER — Other Ambulatory Visit: Payer: Self-pay

## 2024-04-17 DIAGNOSIS — E875 Hyperkalemia: Secondary | ICD-10-CM | POA: Insufficient documentation

## 2024-04-17 DIAGNOSIS — N186 End stage renal disease: Secondary | ICD-10-CM | POA: Insufficient documentation

## 2024-04-17 DIAGNOSIS — I120 Hypertensive chronic kidney disease with stage 5 chronic kidney disease or end stage renal disease: Secondary | ICD-10-CM | POA: Diagnosis not present

## 2024-04-17 DIAGNOSIS — Z992 Dependence on renal dialysis: Secondary | ICD-10-CM | POA: Insufficient documentation

## 2024-04-17 DIAGNOSIS — R112 Nausea with vomiting, unspecified: Secondary | ICD-10-CM | POA: Diagnosis present

## 2024-04-17 DIAGNOSIS — Z79899 Other long term (current) drug therapy: Secondary | ICD-10-CM | POA: Insufficient documentation

## 2024-04-17 LAB — COMPREHENSIVE METABOLIC PANEL WITH GFR
ALT: 43 U/L (ref 0–44)
AST: 43 U/L — ABNORMAL HIGH (ref 15–41)
Albumin: 3.4 g/dL — ABNORMAL LOW (ref 3.5–5.0)
Alkaline Phosphatase: 78 U/L (ref 38–126)
Anion gap: 16 — ABNORMAL HIGH (ref 5–15)
BUN: 93 mg/dL — ABNORMAL HIGH (ref 6–20)
CO2: 27 mmol/L (ref 22–32)
Calcium: 7.7 mg/dL — ABNORMAL LOW (ref 8.9–10.3)
Chloride: 90 mmol/L — ABNORMAL LOW (ref 98–111)
Creatinine, Ser: 16.07 mg/dL — ABNORMAL HIGH (ref 0.61–1.24)
GFR, Estimated: 4 mL/min — ABNORMAL LOW (ref >60)
Glucose, Bld: 100 mg/dL — ABNORMAL HIGH (ref 70–99)
Potassium: >7.5 mmol/L (ref 3.5–5.1)
Sodium: 133 mmol/L — ABNORMAL LOW (ref 135–145)
Total Bilirubin: 0.6 mg/dL (ref 0.0–1.2)
Total Protein: 6.9 g/dL (ref 6.5–8.1)

## 2024-04-17 LAB — CBC
HCT: 26.4 % — ABNORMAL LOW (ref 39.0–52.0)
Hemoglobin: 8.2 g/dL — ABNORMAL LOW (ref 13.0–17.0)
MCH: 29.4 pg (ref 26.0–34.0)
MCHC: 31.1 g/dL (ref 30.0–36.0)
MCV: 94.6 fL (ref 80.0–100.0)
Platelets: 161 10*3/uL (ref 150–400)
RBC: 2.79 MIL/uL — ABNORMAL LOW (ref 4.22–5.81)
RDW: 18.4 % — ABNORMAL HIGH (ref 11.5–15.5)
WBC: 5.4 10*3/uL (ref 4.0–10.5)
nRBC: 0 % (ref 0.0–0.2)

## 2024-04-17 LAB — HEPATITIS B SURFACE ANTIGEN: Hepatitis B Surface Ag: NONREACTIVE

## 2024-04-17 LAB — LIPASE, BLOOD: Lipase: 72 U/L — ABNORMAL HIGH (ref 11–51)

## 2024-04-17 MED ORDER — CALCIUM GLUCONATE 10 % IV SOLN
1.0000 g | Freq: Once | INTRAVENOUS | Status: AC
  Administered 2024-04-17: 1 g via INTRAVENOUS
  Filled 2024-04-17: qty 10

## 2024-04-17 MED ORDER — FENTANYL CITRATE PF 50 MCG/ML IJ SOSY
50.0000 ug | PREFILLED_SYRINGE | Freq: Once | INTRAMUSCULAR | Status: AC
  Administered 2024-04-17: 50 ug via INTRAVENOUS
  Filled 2024-04-17: qty 1

## 2024-04-17 MED ORDER — CHLORHEXIDINE GLUCONATE CLOTH 2 % EX PADS: 6.0000 | MEDICATED_PAD | Freq: Every day | CUTANEOUS | Status: DC

## 2024-04-17 MED ORDER — INSULIN ASPART 100 UNIT/ML IV SOLN
5.0000 [IU] | Freq: Once | INTRAVENOUS | Status: AC
  Administered 2024-04-17: 5 [IU] via INTRAVENOUS

## 2024-04-17 MED ORDER — ONDANSETRON HCL 4 MG/2ML IJ SOLN
4.0000 mg | Freq: Once | INTRAMUSCULAR | Status: AC
  Administered 2024-04-17: 4 mg via INTRAVENOUS
  Filled 2024-04-17: qty 2

## 2024-04-17 MED ORDER — SODIUM ZIRCONIUM CYCLOSILICATE 10 G PO PACK
10.0000 g | PACK | Freq: Once | ORAL | Status: AC
  Administered 2024-04-17: 10 g via ORAL
  Filled 2024-04-17: qty 1

## 2024-04-17 MED ORDER — ONDANSETRON HCL 4 MG/2ML IJ SOLN
4.0000 mg | Freq: Once | INTRAMUSCULAR | Status: AC | PRN
  Administered 2024-04-17: 4 mg via INTRAVENOUS
  Filled 2024-04-17: qty 2

## 2024-04-17 MED ORDER — FAMOTIDINE IN NACL 20-0.9 MG/50ML-% IV SOLN
20.0000 mg | Freq: Once | INTRAVENOUS | Status: AC
  Administered 2024-04-17: 20 mg via INTRAVENOUS
  Filled 2024-04-17: qty 50

## 2024-04-17 MED ORDER — DEXTROSE 50 % IV SOLN
1.0000 | Freq: Once | INTRAVENOUS | Status: AC
  Administered 2024-04-17: 50 mL via INTRAVENOUS
  Filled 2024-04-17: qty 50

## 2024-04-17 NOTE — ED Triage Notes (Signed)
 Pt coming from home via EMS c/o lethargy, nausea, & SOB since 7am today. Vomited x 1. Dialysis pt Tues, Thurs, Sat- no missed dialysis. Pt took all BP meds, then vomited once, and retook his clonidine . EKG shows peaked t-waves, 1 degree AVB, SB 54. First BP 220/160(manual). Just started spironolactone  BP 190/110 HR 54 100% RA  Temp 98.8 BG 104  18g L AC. Right arm limb alert. Lungs CTA

## 2024-04-17 NOTE — ED Notes (Signed)
OTF to dialysis

## 2024-04-17 NOTE — ED Notes (Signed)
 Walked into pt's room and pt was going to take a 3rd dose of clonidine  for BP. Explained the need to ask to take any medications from home d/t interfering with care & safety reasons. Pt also asked for crackers.  Explained the need to stay NPO at this time when presenting with nausea, vomiting, and abd pain. Pt and family verbalized understanding. Provider made aware

## 2024-04-17 NOTE — Progress Notes (Signed)
 Informed by Dr. Nora Beal in regards to ESRD patient in ER. Presents with N/V/body aches. Found to have a K of >7.5 with peaked T waves. Recently started on spiro for BP. Last treatment was Saturday.  Outpatient HD orders: [facility], TTS. F180. 4hrs . AVF, 14G. Flow rates: 500/autoflow 1.5. EDW 98.9kg. 2k, 2cal. Uf profile #3. Heparin : none. Meds: Mircera 200mcg every 2 weeks, venofer 50mg  qweekly (last dose of both was on 4/17).  Plan: -urgent HD planned, 1k first hour followed by 2k bath. Resume TTS schedule tomorrow -agree with [facility] med mgmt for now -please let us  know if patient is to be admitted therefore formal consult can be done -please call with any questions/concerns in the interim  Douglas Donalds, MD [facility]

## 2024-04-17 NOTE — ED Provider Notes (Signed)
 [place] EMERGENCY DEPARTMENT AT [facility] Provider Note   CSN: [id number] Arrival date & time: 04/17/24  1600     History  Chief Complaint  Patient presents with   Fatigue   Shortness of Breath   Hypertension    Douglas [name] is a 38 y.o. male.  Patient is a 38 year old male with a past medical history of hypertension and ESRD on TTS HD presenting to the emergency department with nausea, vomiting and bodyaches.  The patient states that he woke up this morning feeling sick.  He states that he has some epigastric pain though he is unsure if it is due to hunger pains due to not being able to eat today.  He states that his body is aching all over.  He denies any diarrhea.  He denies any known sick contacts.  He states that he feels similar to when his potassium has been high in the past.  He states that his last dialysis session was on Saturday and has not missed any sessions.  He states that he was recently started on spironolactone for his blood pressure.  The history is provided by the patient.  Shortness of Breath Hypertension Associated symptoms include shortness of breath.       Home Medications Prior to Admission medications   Medication Sig Start Date End Date Taking? Authorizing Provider  Ascorbic Acid (VITAMIN C PO) Take 1 tablet by mouth daily. Patient not taking: Reported on 02/14/2022    [provider]  cholecalciferol  (VITAMIN D3) 25 MCG (1000 UNIT) tablet Take 1,000 Units by mouth daily. On dialysis days    [provider]  cloNIDine  (CATAPRES ) 0.3 MG tablet Take 1 tablet by mouth three times a day as directed 02/19/22     ibuprofen  (ADVIL ) 200 MG tablet Take 200 mg by mouth every 6 (six) hours as needed for mild pain.    [provider]  losartan  (COZAAR ) 100 MG tablet Take 1 tablet by mouth every night for high blood pressure 10/16/21     losartan  (COZAAR ) 50 MG tablet TAKE 1 TABLET BY MOUTH EVERY NIGHT FOR HIGH BLOOD  PRESSURE 03/06/21 03/06/22  Charletta Cons, MD  nebivolol (BYSTOLIC) 5 MG tablet Take 5 mg by mouth daily.    [provider]  ondansetron  (ZOFRAN -ODT) 4 MG disintegrating tablet Take 1 tablet (4 mg total) by mouth every 8 (eight) hours as needed for nausea or vomiting. Patient not taking: Reported on 09/06/2023 12/23/22   Redwine, Madison A, PA-C  sevelamer  carbonate (RENVELA ) 800 MG tablet Take 4 tablet by mouth three times a day with meals and 3 tabs BID prn with snacks 04/23/22         Allergies    Pork-derived products    Review of Systems   Review of Systems  Respiratory:  Positive for shortness of breath.     Physical Exam Updated Vital Signs BP (!) 185/98   Pulse 81   Temp (!) 97.5 F (36.4 C) (Oral)   Resp (!) 25   Wt 104.3 kg   SpO2 100%   BMI 27.27 kg/m  Physical Exam Vitals and nursing note reviewed.  Constitutional:      General: He is not in acute distress.    Appearance: He is well-developed. He is ill-appearing.  HENT:     Head: Normocephalic.     Mouth/Throat:     Mouth: Mucous membranes are moist.  Eyes:     Extraocular Movements: Extraocular movements intact.  Cardiovascular:     Rate and Rhythm: Normal rate and regular rhythm.  Pulmonary:     Effort: Pulmonary effort is normal.     Breath sounds: Normal breath sounds.  Abdominal:     Palpations: Abdomen is soft.     Tenderness: There is no abdominal tenderness.  Musculoskeletal:        General: Normal range of motion.     Cervical back: Normal range of motion and neck supple.  Skin:    General: Skin is warm and dry.     Comments: RUE fistula with palpable thrill  Neurological:     General: No focal deficit present.     Mental Status: He is alert and oriented to person, place, and time.  Psychiatric:        Mood and Affect: Mood normal.        Behavior: Behavior normal.     ED Results / Procedures / Treatments   Labs (all labs ordered are listed, but only abnormal results are  displayed) Labs Reviewed  LIPASE, BLOOD - Abnormal; Notable for the following components:      Result Value   Lipase 72 (*)    All other components within normal limits  COMPREHENSIVE METABOLIC PANEL WITH GFR - Abnormal; Notable for the following components:   Sodium 133 (*)    Potassium >7.5 (*)    Chloride 90 (*)    Glucose, Bld 100 (*)    BUN 93 (*)    Creatinine, Ser 16.07 (*)    Calcium  7.7 (*)    Albumin  3.4 (*)    AST 43 (*)    GFR, Estimated 4 (*)    Anion gap 16 (*)    All other components within normal limits  CBC - Abnormal; Notable for the following components:   RBC 2.79 (*)    Hemoglobin 8.2 (*)    HCT 26.4 (*)    RDW 18.4 (*)    All other components within normal limits  URINALYSIS, ROUTINE W REFLEX MICROSCOPIC  HEPATITIS B SURFACE ANTIGEN  HEPATITIS B SURFACE ANTIBODY, QUANTITATIVE    EKG EKG Interpretation Date/Time:  Monday April 17 2024 16:07:08 EDT Ventricular Rate:  57 PR Interval:  249 QRS Duration:  129 QT Interval:  499 QTC Calculation: 486 R Axis:   -43  Text Interpretation: Sinus rhythm Prolonged PR interval Nonspecific IVCD with LAD Left ventricular hypertrophy T-waves appear peaked compared to prior EKG Confirmed by Celesta Coke ([id number]) on 04/17/2024 4:12:26 PM  Radiology No results found.  Procedures .Critical Care  Performed by: Kingsley, Priscella Donna K, DO Authorized by: Nolberto Batty, DO   Critical care provider statement:    Critical care time (minutes):  30   Critical care was necessary to treat or prevent imminent or life-threatening deterioration of the following conditions:  Renal failure   Critical care was time spent personally by me on the following activities:  Development of treatment plan with patient or surrogate, discussions with consultants, evaluation of patient's response to treatment, examination of patient, ordering and review of laboratory studies, ordering and review of radiographic studies, ordering and  performing treatments and interventions, pulse oximetry, re-evaluation of patient's condition and review of old charts     Medications Ordered in ED Medications  Chlorhexidine  Gluconate Cloth 2 % PADS 6 each (has no administration in time range)  ondansetron  (ZOFRAN ) injection 4 mg (4 mg Intravenous Given 04/17/24 1620)  fentaNYL  (SUBLIMAZE ) injection 50 mcg (50 mcg Intravenous Given 04/17/24 1650)  famotidine  (PEPCID ) IVPB 20 mg premix (0 mg Intravenous Stopped 04/17/24 1734)  calcium  gluconate inj 10% (1 g) URGENT USE ONLY! (1 g Intravenous Given 04/17/24 1819)  sodium zirconium cyclosilicate  (LOKELMA ) packet 10 g (10 g Oral Given 04/17/24 1817)  insulin  aspart (novoLOG ) injection 5 Units (5 Units Intravenous Given 04/17/24 1818)    And  dextrose  50 % solution 50 mL (50 mLs Intravenous Given 04/17/24 1818)  ondansetron  (ZOFRAN ) injection 4 mg (4 mg Intravenous Given 04/17/24 1817)    ED Course/ Medical Decision Making/ A&P Clinical Course as of 04/17/24 2019  Mon Apr 17, 2024  1758 K >7.5 and nephrology was consulted. I spoke with Dr. Zelda Hickman from nephrology who will work on getting him on the HD schedule. There likely isn't immediate availability so recommended temporization. Calcium , insulin , dextrose  and lokelma  were ordered. Plan will be for reassessment after HD, possible DC tonight from ED after HD to resume normal outpatient HD tomorrow. [VK]    Clinical Course User Index [VK] Kingsley, Renaye Janicki K, DO                                 Medical Decision Making This patient presents to the ED with chief complaint(s) of N/V, body aches with pertinent past medical history of ESRD on TTS HD, HTN which further complicates the presenting complaint. The complaint involves an extensive differential diagnosis and also carries with it a high risk of complications and morbidity.    The differential diagnosis includes hyperkalemia/electrolyte derangement, gastritis, GERD, pancreatitis, hepatitis,  gastroenteritis, dehydration, arrhythmia, anemia, viral syndrome  Additional history obtained: Additional history obtained from EMS  Records reviewed Care Everywhere/External Records  ED Course and Reassessment: On patient's arrival he is hypertensive and ill-appearing but in no acute distress.  EKG on arrival did appear to have peaked T waves compared to prior EKG concerning for hyperkalemia.  Patient had IV access obtained and labs were sent.  He was given Zofran , fentanyl  and Pepcid  for symptomatic management and will be closely reassessed.  Independent labs interpretation:  The following labs were independently interpreted: hyperkalemia, mildly elevated lipase, otherwise labs at baseline  Independent visualization of imaging: - N/A  Consultation: - Consulted or discussed management/test interpretation w/ external professional: Nephrology  Amount and/or Complexity of Data Reviewed Labs: ordered.  Risk OTC drugs. Prescription drug management.          Final Clinical Impression(s) / ED Diagnoses Final diagnoses:  Hyperkalemia    Rx / DC Orders ED Discharge Orders     None         Kingsley, Deola Rewis K, DO 04/17/24 2019

## 2024-04-18 NOTE — ED Provider Notes (Signed)
 I assumed care of patient after he returned from dialysis Patient had dialysis due to hyperkalemia.  He now reports feeling at his baseline.  No chest pain or shortness of breath.  He plans to continue his normal dialysis sessions this week.  He is also due for his home BP meds says his blood pressure is elevated but he is asymptomatic.  Patient safer for discharge BP (!) 206/108 (BP Location: Left Arm)   Pulse 60   Temp 97.6 F (36.4 C) (Oral)   Resp 18   Wt 104.3 kg   SpO2 100%   BMI 27.27 kg/m     Eldon Greenland, MD 04/18/24 [phone]

## 2024-04-18 NOTE — Progress Notes (Signed)
   04/18/24 0004  Vitals  Temp 97.6 F (36.4 C)  Temp Source Oral  BP (!) 206/108  MAP (mmHg) 136  BP Location Left Arm  BP Method Automatic  Patient Position (if appropriate) Sitting  Pulse Rate 60  Pulse Rate Source Dinamap  Resp 18  Oxygen Therapy  SpO2 100 %  O2 Device Room Air  During Treatment Monitoring  Blood Flow Rate (mL/min) 0 mL/min  Arterial Pressure (mmHg) -0.8 mmHg  Venous Pressure (mmHg) -0.2 mmHg  TMP (mmHg) -47.67 mmHg  Ultrafiltration Rate (mL/min) 274 mL/min  Dialysate Flow Rate (mL/min) 0 ml/min  Dialysate Potassium Concentration 2  Dialysate Calcium  Concentration 2.5  Duration of HD Treatment -hour(s) 3 hour(s)  Cumulative Fluid Removed (mL) per Treatment  2300.04  Intra-Hemodialysis Comments Tx completed  Post Treatment  Dialyzer Clearance Lightly streaked  Liters Processed 72  Fluid Removed (mL) 2300 mL  Tolerated HD Treatment Yes  AVG/AVF Arterial Site Held (minutes) 10 minutes  AVG/AVF Venous Site Held (minutes) 7 minutes  Note  Patient Observations Pt stable   Pt stable post treatment transported to E/R.

## 2024-04-19 LAB — HEPATITIS B SURFACE ANTIBODY, QUANTITATIVE: Hep B S AB Quant (Post): 4982 m[IU]/mL

## 2024-04-24 ENCOUNTER — Other Ambulatory Visit: Payer: Self-pay

## 2024-04-24 ENCOUNTER — Encounter (HOSPITAL_BASED_OUTPATIENT_CLINIC_OR_DEPARTMENT_OTHER): Payer: Self-pay

## 2024-04-24 ENCOUNTER — Emergency Department (HOSPITAL_BASED_OUTPATIENT_CLINIC_OR_DEPARTMENT_OTHER)
Admission: EM | Admit: 2024-04-24 | Discharge: 2024-04-24 | Disposition: A | Discharge status: Home / Self Care | Attending: Emergency Medicine | Admitting: Emergency Medicine

## 2024-04-24 DIAGNOSIS — E875 Hyperkalemia: Secondary | ICD-10-CM | POA: Insufficient documentation

## 2024-04-24 DIAGNOSIS — J449 Chronic obstructive pulmonary disease, unspecified: Secondary | ICD-10-CM | POA: Insufficient documentation

## 2024-04-24 DIAGNOSIS — F17210 Nicotine dependence, cigarettes, uncomplicated: Secondary | ICD-10-CM | POA: Insufficient documentation

## 2024-04-24 DIAGNOSIS — I120 Hypertensive chronic kidney disease with stage 5 chronic kidney disease or end stage renal disease: Secondary | ICD-10-CM | POA: Insufficient documentation

## 2024-04-24 DIAGNOSIS — N186 End stage renal disease: Secondary | ICD-10-CM | POA: Insufficient documentation

## 2024-04-24 DIAGNOSIS — Z992 Dependence on renal dialysis: Secondary | ICD-10-CM | POA: Diagnosis not present

## 2024-04-24 DIAGNOSIS — Z79899 Other long term (current) drug therapy: Secondary | ICD-10-CM | POA: Diagnosis not present

## 2024-04-24 DIAGNOSIS — K5900 Constipation, unspecified: Secondary | ICD-10-CM | POA: Diagnosis not present

## 2024-04-24 DIAGNOSIS — K6289 Other specified diseases of anus and rectum: Secondary | ICD-10-CM | POA: Diagnosis present

## 2024-04-24 LAB — BASIC METABOLIC PANEL WITH GFR
Anion gap: 16 — ABNORMAL HIGH (ref 5–15)
BUN: 79 mg/dL — ABNORMAL HIGH (ref 6–20)
CO2: 27 mmol/L (ref 22–32)
Calcium: 8.2 mg/dL — ABNORMAL LOW (ref 8.9–10.3)
Chloride: 96 mmol/L — ABNORMAL LOW (ref 98–111)
Creatinine, Ser: 15.5 mg/dL — ABNORMAL HIGH (ref 0.61–1.24)
GFR, Estimated: 4 mL/min — ABNORMAL LOW (ref >60)
Glucose, Bld: 117 mg/dL — ABNORMAL HIGH (ref 70–99)
Potassium: 5.6 mmol/L — ABNORMAL HIGH (ref 3.5–5.1)
Sodium: 138 mmol/L (ref 135–145)

## 2024-04-24 LAB — CBC
HCT: 25.8 % — ABNORMAL LOW (ref 39.0–52.0)
Hemoglobin: 8.4 g/dL — ABNORMAL LOW (ref 13.0–17.0)
MCH: 30.5 pg (ref 26.0–34.0)
MCHC: 32.6 g/dL (ref 30.0–36.0)
MCV: 93.8 fL (ref 80.0–100.0)
Platelets: 146 10*3/uL — ABNORMAL LOW (ref 150–400)
RBC: 2.75 MIL/uL — ABNORMAL LOW (ref 4.22–5.81)
RDW: 20.1 % — ABNORMAL HIGH (ref 11.5–15.5)
WBC: 5.2 10*3/uL (ref 4.0–10.5)
nRBC: 0 % (ref 0.0–0.2)

## 2024-04-24 MED ORDER — HYDROCODONE-ACETAMINOPHEN 5-325 MG PO TABS
1.0000 | ORAL_TABLET | Freq: Once | ORAL | Status: DC
  Filled 2024-04-24: qty 1

## 2024-04-24 MED ORDER — BISACODYL 10 MG RE SUPP: 10.0000 mg | RECTAL | 0 refills | Status: AC | PRN

## 2024-04-24 NOTE — ED Provider Notes (Signed)
 [place] EMERGENCY DEPARTMENT AT [facility] Provider Note   CSN: [id number] Arrival date & time: 04/24/24  1048     History  Chief Complaint  Patient presents with   Constipation    Douglas [name] is a 38 y.o. male.   Constipation   38 year old male presents emergency department complaints of constipation.  Reports no bowel movement since Friday but with continued passing of gas.  Denies any abdominal pain, nausea, vomiting.  Does state that he is has some rectal fullness.  Denies any hematochezia/melena.  Does state that he tried to take MiraLAX  at home without improvement of symptoms.  History of ESRD on hemodialysis with most recent full session on Saturday.  States he is due for dialysis tomorrow.  Denies any fevers, chills, chest pain, shortness of breath.  Reports history of hypertension and just took his clonidine  5 minutes prior to initial interview.  Past medical history significant for ESRD on hemodialysis, COPD, hypertension, PVD, anemia  Home Medications Prior to Admission medications   Medication Sig Start Date End Date Taking? Authorizing Provider  Ascorbic Acid (VITAMIN C PO) Take 1 tablet by mouth daily. Patient not taking: Reported on 02/14/2022    [provider]  cholecalciferol  (VITAMIN D3) 25 MCG (1000 UNIT) tablet Take 1,000 Units by mouth daily. On dialysis days    [provider]  cloNIDine  (CATAPRES ) 0.3 MG tablet Take 1 tablet by mouth three times a day as directed 02/19/22     ibuprofen  (ADVIL ) 200 MG tablet Take 200 mg by mouth every 6 (six) hours as needed for mild pain.    [provider]  losartan  (COZAAR ) 100 MG tablet Take 1 tablet by mouth every night for high blood pressure 10/16/21     losartan  (COZAAR ) 50 MG tablet TAKE 1 TABLET BY MOUTH EVERY NIGHT FOR HIGH BLOOD PRESSURE 03/06/21 03/06/22  Charletta Cons, MD  nebivolol (BYSTOLIC) 5 MG tablet Take 5 mg by mouth daily.    [provider]   ondansetron  (ZOFRAN -ODT) 4 MG disintegrating tablet Take 1 tablet (4 mg total) by mouth every 8 (eight) hours as needed for nausea or vomiting. Patient not taking: Reported on 09/06/2023 12/23/22   Redwine, Madison A, PA-C  sevelamer  carbonate (RENVELA ) 800 MG tablet Take 4 tablet by mouth three times a day with meals and 3 tabs BID prn with snacks 04/23/22         Allergies    Pork-derived products    Review of Systems   Review of Systems  Gastrointestinal:  Positive for constipation.  All other systems reviewed and are negative.   Physical Exam Updated Vital Signs BP (!) 189/120 (BP Location: Left Arm)   Pulse (!) 57   Temp 98.6 F (37 C) (Oral)   Resp 18   Ht 6\' 5"  (1.956 m)   Wt 107 kg   SpO2 97%   BMI 27.99 kg/m  Physical Exam Vitals and nursing note reviewed.  Constitutional:      General: He is not in acute distress.    Appearance: He is well-developed.  HENT:     Head: Normocephalic and atraumatic.  Eyes:     Conjunctiva/sclera: Conjunctivae normal.  Cardiovascular:     Rate and Rhythm: Normal rate and regular rhythm.     Comments: Fistula right upper extremity palpable thrill. Pulmonary:     Effort: Pulmonary effort is normal. No respiratory distress.     Breath sounds: Normal breath sounds.  Abdominal:  Palpations: Abdomen is soft.     Tenderness: There is no abdominal tenderness. There is no guarding.  Musculoskeletal:        General: No swelling.     Cervical back: Neck supple.  Skin:    General: Skin is warm and dry.     Capillary Refill: Capillary refill takes less than 2 seconds.  Neurological:     Mental Status: He is alert.  Psychiatric:        Mood and Affect: Mood normal.     ED Results / Procedures / Treatments   Labs (all labs ordered are listed, but only abnormal results are displayed) Labs Reviewed  CBC - Abnormal; Notable for the following components:      Result Value   RBC 2.75 (*)    Hemoglobin 8.4 (*)    HCT 25.8 (*)     RDW 20.1 (*)    Platelets 146 (*)    All other components within normal limits  BASIC METABOLIC PANEL WITH GFR    EKG None  Radiology No results found.  Procedures Procedures    Medications Ordered in ED Medications - No data to display  ED Course/ Medical Decision Making/ A&P                                 Medical Decision Making Amount and/or Complexity of Data Reviewed Labs: ordered.   This patient presents to the ED for concern of consitpation, this involves an extensive number of treatment options, and is a complaint that carries with it a high risk of complications and morbidity.  The differential diagnosis includes SBO/LBO, constipation, fecal impaction, medication side effect, other   Co morbidities that complicate the patient evaluation  See HPI   Additional history obtained:  Additional history obtained from EMR External records from outside source obtained and reviewed including hospital records   Lab Tests:  I Ordered, and personally interpreted labs.  The pertinent results include: No leukocytosis.  Hemoglobin 8.4 which is near patient's baseline.  Thrombocytopenia of 146 which is also near patient's baseline.  Mild hyperkalemia 5.6, calcium  of 8.2.  Patient baseline renal dysfunction creatinine of 15.5, GFR 4 BUN of 79.   Imaging Studies ordered:  N/a   Cardiac Monitoring: / EKG:  The patient was maintained on a cardiac monitor.  I personally viewed and interpreted the cardiac monitored which showed an underlying rhythm of: sinus rhythm   Consultations Obtained:  N/a   Problem List / ED Course / Critical interventions / Medication management  Feelings of constipation Reevaluation of the patient showed that the patient stayed the same I have reviewed the patients home medicines and have made adjustments as needed   Social Determinants of Health:  Cigarette use.  Denies illicit drug use.   Test / Admission -  Considered:  Feelings of constipation Vitals signs significant for initial hypertension blood pressure 189/120 most likely due to medical noncompliance.  Took blood pressure medicine 5 minutes prior to initial interview with improvement with repeat blood pressure. Otherwise within normal range and stable throughout visit. Laboratory studies significant for: See above 38 year old male presents emergency department complaints of constipation.  Reports no bowel movement since Friday but with continued passing of gas.  Denies any abdominal pain, nausea, vomiting.  Does state that he is has some rectal fullness.  Denies any hematochezia/melena.  Does state that he tried to take MiraLAX  at home without  improvement of symptoms.  History of ESRD on hemodialysis with most recent full session on Saturday.  States he is due for dialysis tomorrow.  Denies any fevers, chills, chest pain, shortness of breath.  Reports history of hypertension and just took his clonidine  5 minutes prior to initial interview. On exam, no abdominal tenderness.  Offered patient rectal exam for assessment of possible fecal impaction but this was deferred.  Given patient's lack of reported abdominal pain or reproducible abdominal tenderness, CT imaging was foregone.  Labs without evidence of acute emergent process.  Will recommend MiraLAX  titrated to effect as well as trial of Dulcolax suppository.  Will also recommend daily fiber supplementation.  Treatment plan discussed with patient and he acknowledged understanding was agreeable to said plan.  Patient will well-appearing, afebrile in no acute distress. Worrisome signs and symptoms were discussed with the patient, and the patient acknowledged understanding to return to the ED if noticed. Patient was stable upon discharge.          Final Clinical Impression(s) / ED Diagnoses Final diagnoses:  None    Rx / DC Orders ED Discharge Orders     None         Francis Butter,  [place] 04/24/24 1216    Guadalupe Lee, MD 04/25/24 [phone]

## 2024-04-24 NOTE — Discharge Instructions (Addendum)
 As discussed, we will send you home with medicine for constipation.  Recommend increasing your MiraLAX  dose to 2-4 capfuls.  You may increase as needed for invoking bowel movements or decrease if you develop diarrhea.  Also sent home with a Dulcolax suppository to help with invoking bowel movements.  Keep dialysis appointment tomorrow.  Please not hesitate to return if the worrisome signs and symptoms we discussed become apparent.

## 2024-04-24 NOTE — ED Notes (Signed)
 Just took home clonidine  for HTN.

## 2024-04-24 NOTE — ED Triage Notes (Signed)
 In for eval of constipation since Friday. C/o rectal pain. Denies nausea or vomiting.

## 2024-04-24 NOTE — ED Notes (Signed)
 Pt left before receiving pain meds

## 2024-04-24 NOTE — ED Notes (Signed)

## 2024-04-25 ENCOUNTER — Telehealth (INDEPENDENT_AMBULATORY_CARE_PROVIDER_SITE_OTHER): Payer: Self-pay

## 2024-04-25 NOTE — Transitions of Care (Post Inpatient/ED Visit) (Signed)
   04/25/2024  Name: Douglas [name] MRN: [id number] DOB: 01-Dec-1986  Today's TOC FU Call Status: Today's TOC FU Call Status:: Unsuccessful Call (1st Attempt) Unsuccessful Call (1st Attempt) Date: 04/25/24  Attempted to reach the patient regarding the most recent Inpatient/ED visit.  Follow Up Plan: Additional outreach attempts will be made to reach the patient to complete the Transitions of Care (Post Inpatient/ED visit) call.   Signature Darrall Ellison, LPN [facility] Nurse Health Advisor Direct Dial [phone]

## 2024-04-26 NOTE — Transitions of Care (Post Inpatient/ED Visit) (Signed)
   04/26/2024  Name: Douglas [name] MRN: [id number] DOB: [birth year]  Today's TOC FU Call Status: Today's TOC FU Call Status:: Unsuccessful Call (2nd Attempt) Unsuccessful Call (1st Attempt) Date: 04/25/24 Unsuccessful Call (2nd Attempt) Date: 04/26/24  Attempted to reach the patient regarding the most recent Inpatient/ED visit.  Follow Up Plan: Additional outreach attempts will be made to reach the patient to complete the Transitions of Care (Post Inpatient/ED visit) call.   Signature Darrall Ellison, LPN [facility] Nurse Health Advisor Direct Dial [phone]

## 2024-04-27 NOTE — Transitions of Care (Post Inpatient/ED Visit) (Signed)
   04/27/2024  Name: Douglas [name] MRN: [id number] DOB: 12-23-1986  Today's TOC FU Call Status: Today's TOC FU Call Status:: Unsuccessful Call (3rd Attempt) Unsuccessful Call (1st Attempt) Date: 04/25/24 Unsuccessful Call (2nd Attempt) Date: 04/26/24 Unsuccessful Call (3rd Attempt) Date: 04/27/24  Attempted to reach the patient regarding the most recent Inpatient/ED visit.  Follow Up Plan: No further outreach attempts will be made at this time. We have been unable to contact the patient.  Signature Darrall Ellison, LPN [facility] Nurse Health Advisor Direct Dial [phone]

## 2024-07-05 ENCOUNTER — Ambulatory Visit (INDEPENDENT_AMBULATORY_CARE_PROVIDER_SITE_OTHER): Admitting: Primary Care

## 2024-07-05 ENCOUNTER — Encounter (INDEPENDENT_AMBULATORY_CARE_PROVIDER_SITE_OTHER): Payer: Self-pay | Admitting: Primary Care

## 2024-07-05 VITALS — BP 209/120 | HR 68 | Resp 16 | Ht 77.0 in | Wt 229.0 lb

## 2024-07-05 DIAGNOSIS — N186 End stage renal disease: Secondary | ICD-10-CM | POA: Diagnosis not present

## 2024-07-05 DIAGNOSIS — F1920 Other psychoactive substance dependence, uncomplicated: Secondary | ICD-10-CM

## 2024-07-05 DIAGNOSIS — Z992 Dependence on renal dialysis: Secondary | ICD-10-CM

## 2024-07-05 DIAGNOSIS — I129 Hypertensive chronic kidney disease with stage 1 through stage 4 chronic kidney disease, or unspecified chronic kidney disease: Secondary | ICD-10-CM

## 2024-07-05 DIAGNOSIS — Z2821 Immunization not carried out because of patient refusal: Secondary | ICD-10-CM | POA: Diagnosis not present

## 2024-07-05 DIAGNOSIS — E875 Hyperkalemia: Secondary | ICD-10-CM

## 2024-07-05 MED ORDER — ALBUTEROL SULFATE HFA 108 (90 BASE) MCG/ACT IN AERS: 2.0000 | INHALATION_SPRAY | Freq: Four times a day (QID) | RESPIRATORY_TRACT | 2 refills | Status: AC | PRN
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
## 2024-07-10 ENCOUNTER — Encounter (INDEPENDENT_AMBULATORY_CARE_PROVIDER_SITE_OTHER): Payer: Self-pay | Admitting: Primary Care

## 2024-07-18 ENCOUNTER — Telehealth: Payer: Self-pay | Admitting: *Deleted

## 2024-07-18 NOTE — Transitions of Care (Post Inpatient/ED Visit) (Signed)
   07/18/2024  Name: Douglas [name] MRN: [id number] DOB: 1986-01-03  Today's TOC FU Call Status: Today's TOC FU Call Status:: Unsuccessful Call (1st Attempt) Unsuccessful Call (1st Attempt) Date: 07/18/24  Attempted to reach the patient regarding the most recent Inpatient/ED visit.  Follow Up Plan: Additional outreach attempts will be made to reach the patient to complete the Transitions of Care (Post Inpatient/ED visit) call.   Andrea Dimes RN, BSN [employer]  [facility] RN Care Manager [phone]

## 2024-07-19 ENCOUNTER — Telehealth: Payer: Self-pay | Admitting: *Deleted

## 2024-07-19 NOTE — Transitions of Care (Post Inpatient/ED Visit) (Signed)
   07/19/2024  Name: Douglas [name] MRN: [id number] DOB: 1986/08/14  Today's TOC FU Call Status: Today's TOC FU Call Status:: Unsuccessful Call (2nd Attempt) Unsuccessful Call (2nd Attempt) Date: 07/19/24  Attempted to reach the patient regarding the most recent Inpatient/ED visit.  Follow Up Plan: Additional outreach attempts will be made to reach the patient to complete the Transitions of Care (Post Inpatient/ED visit) call.   Andrea Dimes RN, BSN [place]  [facility] RN Care Manager [phone]

## 2024-07-20 ENCOUNTER — Telehealth: Payer: Self-pay | Admitting: *Deleted

## 2024-07-20 NOTE — Transitions of Care (Post Inpatient/ED Visit) (Signed)
   07/20/2024  Name: Douglas [name] MRN: [id number] DOB: 07/12/1986  Today's TOC FU Call Status: Today's TOC FU Call Status:: Unsuccessful Call (3rd Attempt) Unsuccessful Call (3rd Attempt) Date: 07/20/24  Attempted to reach the patient regarding the most recent Inpatient/ED visit.  Follow Up Plan: No further outreach attempts will be made at this time. We have been unable to contact the patient.  Andrea Dimes RN, BSN [place]  [facility] RN Care Manager [phone]
